# Patient Record
Sex: Female | Born: 1945 | Race: White | Hispanic: No | Smoking: Former smoker
Health system: Southern US, Community
[De-identification: ages and names within clinical notes are randomized; demographics above are authoritative.]

## PROBLEM LIST (undated history)

## (undated) DIAGNOSIS — E559 Vitamin D deficiency, unspecified: Secondary | ICD-10-CM

## (undated) DIAGNOSIS — I5042 Chronic combined systolic (congestive) and diastolic (congestive) heart failure: Secondary | ICD-10-CM

## (undated) DIAGNOSIS — E785 Hyperlipidemia, unspecified: Secondary | ICD-10-CM

## (undated) DIAGNOSIS — E538 Deficiency of other specified B group vitamins: Secondary | ICD-10-CM

## (undated) DIAGNOSIS — R0602 Shortness of breath: Secondary | ICD-10-CM

## (undated) DIAGNOSIS — I25119 Atherosclerotic heart disease of native coronary artery with unspecified angina pectoris: Secondary | ICD-10-CM

## (undated) DIAGNOSIS — I213 ST elevation (STEMI) myocardial infarction of unspecified site: Secondary | ICD-10-CM

## (undated) DIAGNOSIS — Z95810 Presence of automatic (implantable) cardiac defibrillator: Secondary | ICD-10-CM

## (undated) DIAGNOSIS — I509 Heart failure, unspecified: Secondary | ICD-10-CM

## (undated) DIAGNOSIS — I214 Non-ST elevation (NSTEMI) myocardial infarction: Secondary | ICD-10-CM

## (undated) DIAGNOSIS — I10 Essential (primary) hypertension: Secondary | ICD-10-CM

## (undated) DIAGNOSIS — J189 Pneumonia, unspecified organism: Secondary | ICD-10-CM

## (undated) DIAGNOSIS — I351 Nonrheumatic aortic (valve) insufficiency: Secondary | ICD-10-CM

## (undated) DIAGNOSIS — I5022 Chronic systolic (congestive) heart failure: Secondary | ICD-10-CM

## (undated) DIAGNOSIS — I255 Ischemic cardiomyopathy: Secondary | ICD-10-CM

## (undated) DIAGNOSIS — I2510 Atherosclerotic heart disease of native coronary artery without angina pectoris: Secondary | ICD-10-CM

## (undated) DIAGNOSIS — I2111 ST elevation (STEMI) myocardial infarction involving right coronary artery: Secondary | ICD-10-CM

## (undated) DIAGNOSIS — R079 Chest pain, unspecified: Secondary | ICD-10-CM

## (undated) DIAGNOSIS — N183 Chronic kidney disease, stage 3 (moderate): Secondary | ICD-10-CM

## (undated) DIAGNOSIS — I1310 Hypertensive heart and chronic kidney disease without heart failure, with stage 1 through stage 4 chronic kidney disease, or unspecified chronic kidney disease: Secondary | ICD-10-CM

## (undated) DIAGNOSIS — M1990 Unspecified osteoarthritis, unspecified site: Secondary | ICD-10-CM

## (undated) DIAGNOSIS — E119 Type 2 diabetes mellitus without complications: Secondary | ICD-10-CM

## (undated) HISTORY — PX: CATARACT EXTRACTION, BILATERAL: SHX1313

## (undated) HISTORY — PX: CORONARY ANGIOPLASTY WITH STENT PLACEMENT: SHX49

## (undated) HISTORY — DX: Chronic kidney disease, stage 3 (moderate): N18.3

## (undated) HISTORY — DX: Nonrheumatic aortic (valve) insufficiency: I35.1

## (undated) HISTORY — DX: Vitamin D deficiency, unspecified: E55.9

## (undated) HISTORY — PX: INSERT / REPLACE / REMOVE PACEMAKER: SUR710

## (undated) HISTORY — DX: ST elevation (STEMI) myocardial infarction involving right coronary artery: I21.11

## (undated) HISTORY — DX: Atherosclerotic heart disease of native coronary artery with unspecified angina pectoris: I25.119

## (undated) HISTORY — PX: OTHER SURGICAL HISTORY: SHX169

## (undated) HISTORY — DX: Type 2 diabetes mellitus without complications: E11.9

## (undated) HISTORY — DX: Chronic combined systolic (congestive) and diastolic (congestive) heart failure: I50.42

## (undated) HISTORY — DX: Heart failure, unspecified: I50.9

## (undated) HISTORY — PX: URETEROSCOPY: SHX842

## (undated) HISTORY — DX: Deficiency of other specified B group vitamins: E53.8

## (undated) HISTORY — DX: Essential (primary) hypertension: I10

## (undated) HISTORY — PX: CHOLECYSTECTOMY: SHX55

## (undated) HISTORY — DX: Hyperlipidemia, unspecified: E78.5

## (undated) HISTORY — DX: Shortness of breath: R06.02

## (undated) HISTORY — DX: Pneumonia, unspecified organism: J18.9

## (undated) HISTORY — PX: LITHOTRIPSY: SUR834

## (undated) HISTORY — DX: Presence of automatic (implantable) cardiac defibrillator: Z95.810

## (undated) HISTORY — DX: Unspecified osteoarthritis, unspecified site: M19.90

## (undated) HISTORY — DX: Chest pain, unspecified: R07.9

## (undated) HISTORY — DX: ST elevation (STEMI) myocardial infarction of unspecified site: I21.3

## (undated) HISTORY — DX: Ischemic cardiomyopathy: I25.5

## (undated) HISTORY — DX: Non-ST elevation (NSTEMI) myocardial infarction: I21.4

## (undated) HISTORY — DX: Atherosclerotic heart disease of native coronary artery without angina pectoris: I25.10

## (undated) HISTORY — DX: Hypertensive heart and chronic kidney disease without heart failure, with stage 1 through stage 4 chronic kidney disease, or unspecified chronic kidney disease: I13.10

## (undated) HISTORY — DX: Chronic systolic (congestive) heart failure: I50.22

---

## 2007-11-01 ENCOUNTER — Encounter: Admission: RE | Admit: 2007-11-01 | Discharge: 2007-11-01 | Payer: Self-pay | Admitting: Gastroenterology

## 2007-12-22 ENCOUNTER — Inpatient Hospital Stay (HOSPITAL_COMMUNITY): Admission: RE | Admit: 2007-12-22 | Discharge: 2007-12-24 | Payer: Self-pay | Admitting: Neurosurgery

## 2010-04-23 IMAGING — CR DG CERVICAL SPINE 2 OR 3 VIEWS
1 series · 1 of 1 positions shown · non-contrast
Comparison: None.

CLINICAL DATA: Cervical HNP and radiculopathy.  C5-6 and C6-7 ACDF.

CERVICAL SPINE - 2-3 VIEW

[view not recorded]
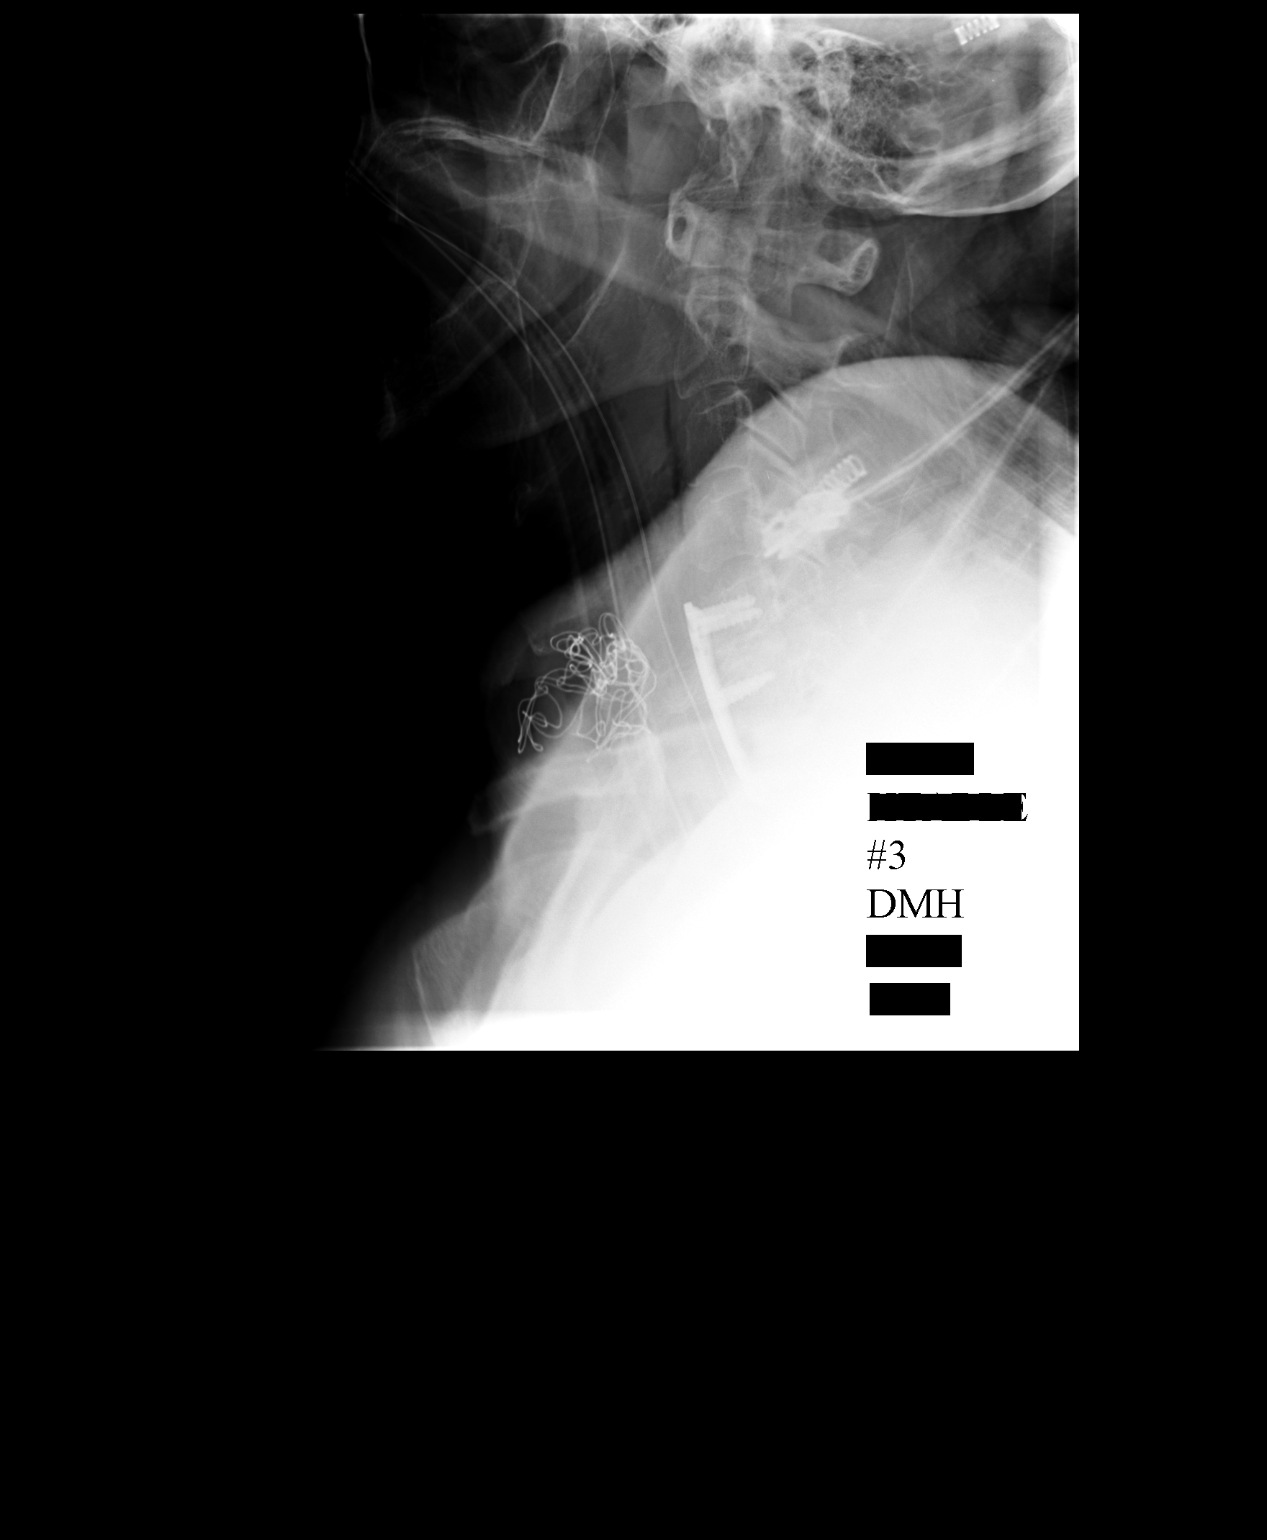

[1 of 1 positions shown; findings below may reference images not displayed]

FINDINGS: Three cross-table lateral views are received from the
operating room.  View.
1 reveals needle pointer projecting over the C4-5 disc space.
Second view was taken at 5155 hours revealing needle pointer
projecting over the C4-5 interspace and surgical instrument pointer
just anterior to the C5-6 interspace.  The third view was taken at
5027 hours revealing anterior plate and screw fixation hardware and
interbody fusion plugs from C5 to C7. C7-T1 level is obscured by
the overlying soft tissues.
IMPRESSION: Satisfactory appearance of ACDF from C5 to C7.

## 2010-12-24 NOTE — Op Note (Signed)
Lori Rivera, Lori Rivera               ACCOUNT NO.:  [id number]   MEDICAL RECORD NO.:  [id number]          PATIENT TYPE:  INP   LOCATION:  3011                         FACILITY:  [facility]   PHYSICIAN:  Leeroy Cha, M.D.   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   PROCEDURE:  1. Anterior C5-C6 and C6-C7 diskectomy.  2. Decompression of spinal cord.  3. Foraminotomy.  4. Interbody fusion with allograft and autograft plate.  5. Microscope.   SURGEON:  Leeroy Cha, MD.   ASSISTANT:  Ophelia Charter, MD.   CLINICAL HISTORY:  The patient was admitted because of neck pain  radiates to both upper extremities associated with weakness.  The pain  had been going on for several weeks, worse in the left upper extremity.  This problem, although, it got worse several weeks ago and started back  in the winter.  The x-rays showed that she has a degenerative disk  disease with spondylosis at the level of C5-C6 and C6-C7, and surgery  was advised.   PROCEDURE:  The patient was taken to the OR and after intubation, the  left side of the neck was cleaned with DuraPrep.  Transverse incision  was made through the skin subcutaneous tissue down to the cervical  spine.  X-ray twice showed that the needle was at the level of C4-C5 and  the probe of the level of C5-C6.  From then on, we removed the large  anterior osteophyte at the level of C5-C6 and C6-C7.  The patient had  __________  space that we brought immediately the microscope.  With the  microcurette as well as the drill, we did the diskectomy of C5-C6 and C6-  C7.  The patient had quite a bit of narrowing with thinning of the  posterior ligament.  Decompression was achieved, and we were  able to  decompress both C6 nerve roots bilaterally.  Then our attention was  brought to the level of C6-C7 with the same finding.  Then the endplate  was drilled.  Two piece of allograft of 7 mm with autograft in the  middle were used for fusion followed by plate using 5 screws.  Lateral  cervical spine showed good position of the graft and the plate.  From  then on, the area was irrigated.  We achieved good hemostasis, and the  wound was closed with Vicryl and Steri-Strips.           ______________________________  Leeroy Cha, M.D.     EB/MEDQ  D:  12/22/2007  T:  12/23/2007  Job:  [id number]

## 2011-05-06 ENCOUNTER — Ambulatory Visit (INDEPENDENT_AMBULATORY_CARE_PROVIDER_SITE_OTHER): Payer: Self-pay | Admitting: Ophthalmology

## 2011-06-03 ENCOUNTER — Ambulatory Visit (INDEPENDENT_AMBULATORY_CARE_PROVIDER_SITE_OTHER): Payer: Medicare Other | Admitting: Ophthalmology

## 2011-06-03 DIAGNOSIS — D3130 Benign neoplasm of unspecified choroid: Secondary | ICD-10-CM

## 2011-06-03 DIAGNOSIS — E11319 Type 2 diabetes mellitus with unspecified diabetic retinopathy without macular edema: Secondary | ICD-10-CM

## 2011-06-03 DIAGNOSIS — H43819 Vitreous degeneration, unspecified eye: Secondary | ICD-10-CM

## 2011-06-17 ENCOUNTER — Encounter (INDEPENDENT_AMBULATORY_CARE_PROVIDER_SITE_OTHER): Payer: Medicare Other | Admitting: Ophthalmology

## 2011-06-17 DIAGNOSIS — E11359 Type 2 diabetes mellitus with proliferative diabetic retinopathy without macular edema: Secondary | ICD-10-CM

## 2011-06-17 DIAGNOSIS — E1139 Type 2 diabetes mellitus with other diabetic ophthalmic complication: Secondary | ICD-10-CM

## 2011-10-15 ENCOUNTER — Ambulatory Visit (INDEPENDENT_AMBULATORY_CARE_PROVIDER_SITE_OTHER): Payer: Medicare Other | Admitting: Ophthalmology

## 2011-10-15 DIAGNOSIS — E11319 Type 2 diabetes mellitus with unspecified diabetic retinopathy without macular edema: Secondary | ICD-10-CM

## 2011-10-15 DIAGNOSIS — H43819 Vitreous degeneration, unspecified eye: Secondary | ICD-10-CM

## 2011-10-15 DIAGNOSIS — E1139 Type 2 diabetes mellitus with other diabetic ophthalmic complication: Secondary | ICD-10-CM

## 2011-10-15 DIAGNOSIS — D3130 Benign neoplasm of unspecified choroid: Secondary | ICD-10-CM

## 2011-10-15 DIAGNOSIS — E11359 Type 2 diabetes mellitus with proliferative diabetic retinopathy without macular edema: Secondary | ICD-10-CM

## 2012-04-16 ENCOUNTER — Ambulatory Visit (INDEPENDENT_AMBULATORY_CARE_PROVIDER_SITE_OTHER): Payer: Medicare Other | Admitting: Ophthalmology

## 2012-05-12 ENCOUNTER — Ambulatory Visit (INDEPENDENT_AMBULATORY_CARE_PROVIDER_SITE_OTHER): Payer: Medicare Other | Admitting: Ophthalmology

## 2012-05-12 DIAGNOSIS — H35039 Hypertensive retinopathy, unspecified eye: Secondary | ICD-10-CM

## 2012-05-12 DIAGNOSIS — D3130 Benign neoplasm of unspecified choroid: Secondary | ICD-10-CM

## 2012-05-12 DIAGNOSIS — H43819 Vitreous degeneration, unspecified eye: Secondary | ICD-10-CM

## 2012-05-12 DIAGNOSIS — E11359 Type 2 diabetes mellitus with proliferative diabetic retinopathy without macular edema: Secondary | ICD-10-CM

## 2012-05-12 DIAGNOSIS — E11319 Type 2 diabetes mellitus with unspecified diabetic retinopathy without macular edema: Secondary | ICD-10-CM

## 2012-05-12 DIAGNOSIS — I10 Essential (primary) hypertension: Secondary | ICD-10-CM

## 2012-05-12 DIAGNOSIS — E1139 Type 2 diabetes mellitus with other diabetic ophthalmic complication: Secondary | ICD-10-CM

## 2012-11-12 ENCOUNTER — Encounter (INDEPENDENT_AMBULATORY_CARE_PROVIDER_SITE_OTHER): Payer: Medicare Other | Admitting: Ophthalmology

## 2012-12-14 ENCOUNTER — Encounter (INDEPENDENT_AMBULATORY_CARE_PROVIDER_SITE_OTHER): Payer: Self-pay | Admitting: Ophthalmology

## 2014-01-18 ENCOUNTER — Encounter (INDEPENDENT_AMBULATORY_CARE_PROVIDER_SITE_OTHER): Payer: Medicare Other | Admitting: Ophthalmology

## 2014-01-18 DIAGNOSIS — I10 Essential (primary) hypertension: Secondary | ICD-10-CM

## 2014-01-18 DIAGNOSIS — H43819 Vitreous degeneration, unspecified eye: Secondary | ICD-10-CM

## 2014-01-18 DIAGNOSIS — D3130 Benign neoplasm of unspecified choroid: Secondary | ICD-10-CM

## 2014-01-18 DIAGNOSIS — H35039 Hypertensive retinopathy, unspecified eye: Secondary | ICD-10-CM

## 2014-01-18 DIAGNOSIS — E1165 Type 2 diabetes mellitus with hyperglycemia: Secondary | ICD-10-CM

## 2014-01-18 DIAGNOSIS — E1139 Type 2 diabetes mellitus with other diabetic ophthalmic complication: Secondary | ICD-10-CM

## 2014-01-18 DIAGNOSIS — E11359 Type 2 diabetes mellitus with proliferative diabetic retinopathy without macular edema: Secondary | ICD-10-CM

## 2014-01-18 DIAGNOSIS — E11319 Type 2 diabetes mellitus with unspecified diabetic retinopathy without macular edema: Secondary | ICD-10-CM

## 2014-07-21 ENCOUNTER — Ambulatory Visit (INDEPENDENT_AMBULATORY_CARE_PROVIDER_SITE_OTHER): Payer: Medicare Other | Admitting: Ophthalmology

## 2015-03-07 DIAGNOSIS — E785 Hyperlipidemia, unspecified: Secondary | ICD-10-CM | POA: Insufficient documentation

## 2015-03-07 DIAGNOSIS — I1310 Hypertensive heart and chronic kidney disease without heart failure, with stage 1 through stage 4 chronic kidney disease, or unspecified chronic kidney disease: Secondary | ICD-10-CM | POA: Insufficient documentation

## 2015-03-07 DIAGNOSIS — E1122 Type 2 diabetes mellitus with diabetic chronic kidney disease: Secondary | ICD-10-CM | POA: Insufficient documentation

## 2015-03-07 DIAGNOSIS — N183 Chronic kidney disease, stage 3 unspecified: Secondary | ICD-10-CM

## 2015-03-07 DIAGNOSIS — E119 Type 2 diabetes mellitus without complications: Secondary | ICD-10-CM

## 2015-03-07 DIAGNOSIS — I2510 Atherosclerotic heart disease of native coronary artery without angina pectoris: Secondary | ICD-10-CM | POA: Insufficient documentation

## 2015-03-07 HISTORY — DX: Hyperlipidemia, unspecified: E78.5

## 2015-03-07 HISTORY — DX: Type 2 diabetes mellitus without complications: E11.9

## 2015-03-07 HISTORY — DX: Atherosclerotic heart disease of native coronary artery without angina pectoris: I25.10

## 2015-03-07 HISTORY — DX: Hypertensive heart and chronic kidney disease without heart failure, with stage 1 through stage 4 chronic kidney disease, or unspecified chronic kidney disease: I13.10

## 2015-03-07 HISTORY — DX: Chronic kidney disease, stage 3 unspecified: N18.30

## 2015-05-25 DIAGNOSIS — I214 Non-ST elevation (NSTEMI) myocardial infarction: Secondary | ICD-10-CM

## 2015-05-25 HISTORY — DX: Non-ST elevation (NSTEMI) myocardial infarction: I21.4

## 2015-06-07 DIAGNOSIS — I5022 Chronic systolic (congestive) heart failure: Secondary | ICD-10-CM

## 2015-06-07 DIAGNOSIS — I5023 Acute on chronic systolic (congestive) heart failure: Secondary | ICD-10-CM | POA: Insufficient documentation

## 2015-06-07 DIAGNOSIS — N183 Chronic kidney disease, stage 3 unspecified: Secondary | ICD-10-CM

## 2015-06-07 HISTORY — DX: Chronic kidney disease, stage 3 unspecified: N18.30

## 2015-06-07 HISTORY — DX: Chronic systolic (congestive) heart failure: I50.22

## 2015-08-03 DIAGNOSIS — I213 ST elevation (STEMI) myocardial infarction of unspecified site: Secondary | ICD-10-CM

## 2015-08-03 DIAGNOSIS — I2111 ST elevation (STEMI) myocardial infarction involving right coronary artery: Secondary | ICD-10-CM

## 2015-08-03 HISTORY — DX: ST elevation (STEMI) myocardial infarction of unspecified site: I21.3

## 2015-08-03 HISTORY — DX: ST elevation (STEMI) myocardial infarction involving right coronary artery: I21.11

## 2015-08-08 DIAGNOSIS — J189 Pneumonia, unspecified organism: Secondary | ICD-10-CM

## 2015-08-08 DIAGNOSIS — R079 Chest pain, unspecified: Secondary | ICD-10-CM | POA: Insufficient documentation

## 2015-08-08 HISTORY — DX: Pneumonia, unspecified organism: J18.9

## 2015-08-08 HISTORY — DX: Chest pain, unspecified: R07.9

## 2015-08-19 DIAGNOSIS — J189 Pneumonia, unspecified organism: Secondary | ICD-10-CM

## 2015-08-19 DIAGNOSIS — I5042 Chronic combined systolic (congestive) and diastolic (congestive) heart failure: Secondary | ICD-10-CM

## 2015-08-19 HISTORY — DX: Chronic combined systolic (congestive) and diastolic (congestive) heart failure: I50.42

## 2015-08-19 HISTORY — DX: Pneumonia, unspecified organism: J18.9

## 2015-11-18 DIAGNOSIS — I255 Ischemic cardiomyopathy: Secondary | ICD-10-CM

## 2015-11-18 HISTORY — DX: Ischemic cardiomyopathy: I25.5

## 2016-05-14 ENCOUNTER — Ambulatory Visit (INDEPENDENT_AMBULATORY_CARE_PROVIDER_SITE_OTHER): Payer: Medicare Other | Admitting: Sports Medicine

## 2016-05-14 ENCOUNTER — Encounter: Payer: Self-pay | Admitting: Sports Medicine

## 2016-05-14 DIAGNOSIS — B351 Tinea unguium: Secondary | ICD-10-CM

## 2016-05-14 DIAGNOSIS — M79675 Pain in left toe(s): Secondary | ICD-10-CM

## 2016-05-14 DIAGNOSIS — I739 Peripheral vascular disease, unspecified: Secondary | ICD-10-CM | POA: Diagnosis not present

## 2016-05-14 DIAGNOSIS — M79674 Pain in right toe(s): Secondary | ICD-10-CM | POA: Diagnosis not present

## 2016-05-14 DIAGNOSIS — E1142 Type 2 diabetes mellitus with diabetic polyneuropathy: Secondary | ICD-10-CM

## 2016-05-14 NOTE — Patient Instructions (Signed)
Diabetes and Foot Care Diabetes may cause you to have problems because of poor blood supply (circulation) to your feet and legs. This may cause the skin on your feet to become thinner, break easier, and heal more slowly. Your skin may become dry, and the skin may peel and crack. You may also have nerve damage in your legs and feet causing decreased feeling in them. You may not notice minor injuries to your feet that could lead to infections or more serious problems. Taking care of your feet is one of the most important things you can do for yourself.  HOME CARE INSTRUCTIONS  Wear shoes at all times, even in the house. Do not go barefoot. Bare feet are easily injured.  Check your feet daily for blisters, cuts, and redness. If you cannot see the bottom of your feet, use a mirror or ask someone for help.  Wash your feet with warm water (do not use hot water) and mild soap. Then pat your feet and the areas between your toes until they are completely dry. Do not soak your feet as this can dry your skin.  Apply a moisturizing lotion or petroleum jelly (that does not contain alcohol and is unscented) to the skin on your feet and to dry, brittle toenails. Do not apply lotion between your toes.  Trim your toenails straight across. Do not dig under them or around the cuticle. File the edges of your nails with an emery board or nail file.  Do not cut corns or calluses or try to remove them with medicine.  Wear clean socks or stockings every day. Make sure they are not too tight. Do not wear knee-high stockings since they may decrease blood flow to your legs.  Wear shoes that fit properly and have enough cushioning. To break in new shoes, wear them for just a few hours a day. This prevents you from injuring your feet. Always look in your shoes before you put them on to be sure there are no objects inside.  Do not cross your legs. This may decrease the blood flow to your feet.  If you find a minor scrape,  cut, or break in the skin on your feet, keep it and the skin around it clean and dry. These areas may be cleansed with mild soap and water. Do not cleanse the area with peroxide, alcohol, or iodine.  When you remove an adhesive bandage, be sure not to damage the skin around it.  If you have a wound, look at it several times a day to make sure it is healing.  Do not use heating pads or hot water bottles. They may burn your skin. If you have lost feeling in your feet or legs, you may not know it is happening until it is too late.  Make sure your health care provider performs a complete foot exam at least annually or more often if you have foot problems. Report any cuts, sores, or bruises to your health care provider immediately. SEEK MEDICAL CARE IF:   You have an injury that is not healing.  You have cuts or breaks in the skin.  You have an ingrown nail.  You notice redness on your legs or feet.  You feel burning or tingling in your legs or feet.  You have pain or cramps in your legs and feet.  Your legs or feet are numb.  Your feet always feel cold. SEEK IMMEDIATE MEDICAL CARE IF:   There is increasing redness,   swelling, or pain in or around a wound.  There is a red line that goes up your leg.  Pus is coming from a wound.  You develop a fever or as directed by your health care provider.  You notice a bad smell coming from an ulcer or wound.   This information is not intended to replace advice given to you by your health care provider. Make sure you discuss any questions you have with your health care provider.   Document Released: 07/25/2000 Document Revised: 03/30/2013 Document Reviewed: 01/04/2013 Elsevier Interactive Patient Education 2016 [employer] Inc[employer]  

## 2016-05-14 NOTE — Progress Notes (Signed)
Subjective: Lori Rivera is a 70 y.o. female patient with history of diabetes who presents to office today complaining of long, painful nails  while ambulating in shoes; unable to trim. Patient states that the glucose reading this morning was not recorded. Patient denies any new changes in medication or new problems. Patient denies any new cramping, numbness, burning or tingling in the legs.  Patient is assisted by daughter who reports that she was unable to trim nails.   Patient Active Problem List   Diagnosis Date Noted  . Ischemic cardiomyopathy 11/18/2015  . Chronic combined systolic and diastolic heart failure ([place]) 08/19/2015  . Pneumonia due to infectious organism 08/19/2015  . Chest pain 08/08/2015  . PNA (pneumonia) 08/08/2015  . ST elevation myocardial infarction involving right coronary artery ([place]) 08/03/2015  . STEMI (ST elevation myocardial infarction) ([place]) 08/03/2015  . Chronic systolic heart failure ([place]) 06/07/2015  . CKD (chronic kidney disease) stage 3, GFR 30-59 ml/min 06/07/2015  . NSTEMI (non-ST elevated myocardial infarction) ([place]) 05/25/2015  . CAD in native artery 03/07/2015  . Hyperlipidemia 03/07/2015  . 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 Type 2 diabetes mellitus ([place]) 03/07/2015   No current outpatient prescriptions on file prior to visit.   No current facility-administered medications on file prior to visit.    Allergies  Allergen Reactions  . Codeine Other (See Comments)    confused  . Penicillins Rash    No results found for this or any previous visit (from the past 2160 hour(s)).  Objective: General: Patient is awake, alert, and oriented x 3 and in no acute distress.  Integument: Skin is cool, dry and supple bilateral. Nails are tender, long, thickened and dystrophic with subungual debris, consistent with onychomycosis, 1-5 bilateral. No signs of infection. No open lesions or preulcerative  lesions present bilateral. Remaining integument unremarkable.  Vasculature:  Dorsalis Pedis pulse 1/4 bilateral. Posterior Tibial pulse  0/4 bilateral.  Capillary fill time <5 sec 1-5 bilateral. No ischemia. No gangrene. No hair growth to the level of the digits. Temperature gradient decreased bilateral. Mild varicosities present bilateral. 1+ pitting edema present bilateral.   Neurology: The patient has intact sensation measured with a 5.07/10g Semmes Weinstein Monofilament at all pedal sites bilateral . Vibratory sensation diminished bilateral with tuning fork. No Babinski sign present bilateral.   Musculoskeletal: No symptomatic pedal deformities noted bilateral. Muscular strength 5/5 in all lower extremity muscular groups bilateral without pain on range of motion . No tenderness with calf compression bilateral.  Assessment and Plan: Problem List Items Addressed This Visit    None    Visit Diagnoses    Dermatophytosis of nail    -  Primary   Toe pain, bilateral       Diabetic polyneuropathy associated with type 2 diabetes mellitus (HCC)       Relevant Medications   aspirin EC 81 MG tablet   atorvastatin (LIPITOR) 80 MG tablet   metFORMIN (GLUCOPHAGE) 1000 MG tablet   PVD (peripheral vascular disease) (HCC)       Relevant Medications   aspirin EC 81 MG tablet   atorvastatin (LIPITOR) 80 MG tablet   furosemide (LASIX) 40 MG tablet   nitroGLYCERIN (NITROSTAT) 0.4 MG SL tablet      -Examined patient. -Discussed and educated patient on diabetic foot care, especially with  regards to the vascular, neurological and musculoskeletal systems.  -Stressed the importance of good glycemic control and the detriment of not  controlling glucose  levels in relation to the foot. -Mechanically debrided all nails 1-5 bilateral using sterile nail nipper and filed with dremel without incident  -Encouraged elevation when sitting to assist with edema control  -Answered all patient questions -Patient  to return  in 2.5 months for at risk foot care -Patient advised to call the office if any problems or questions arise in the meantime.  Landis Martins, DPM

## 2016-07-16 ENCOUNTER — Ambulatory Visit (INDEPENDENT_AMBULATORY_CARE_PROVIDER_SITE_OTHER): Payer: Medicare Other | Admitting: Sports Medicine

## 2016-07-16 ENCOUNTER — Encounter: Payer: Self-pay | Admitting: Sports Medicine

## 2016-07-16 DIAGNOSIS — M79674 Pain in right toe(s): Secondary | ICD-10-CM | POA: Diagnosis not present

## 2016-07-16 DIAGNOSIS — I739 Peripheral vascular disease, unspecified: Secondary | ICD-10-CM

## 2016-07-16 DIAGNOSIS — M79675 Pain in left toe(s): Secondary | ICD-10-CM

## 2016-07-16 DIAGNOSIS — B351 Tinea unguium: Secondary | ICD-10-CM | POA: Diagnosis not present

## 2016-07-16 DIAGNOSIS — E1142 Type 2 diabetes mellitus with diabetic polyneuropathy: Secondary | ICD-10-CM | POA: Diagnosis not present

## 2016-07-16 NOTE — Progress Notes (Signed)
Subjective: Lori Rivera is a 70 y.o. female patient with history of diabetes who presents to office today complaining of long, painful nails  while ambulating in shoes; unable to trim. Patient states that the glucose reading this morning was not recorded. Patient denies any new changes in medication or new problems. Patient denies any new cramping, numbness, burning or tingling in the legs.  Patient is assisted by son who is in the waiting room for patient.  Patient Active Problem List   Diagnosis Date Noted  . Ischemic cardiomyopathy 11/18/2015  . Chronic combined systolic and diastolic heart failure ([place]) 08/19/2015  . Pneumonia due to infectious organism 08/19/2015  . Chest pain 08/08/2015  . PNA (pneumonia) 08/08/2015  . ST elevation myocardial infarction involving right coronary artery ([place]) 08/03/2015  . STEMI (ST elevation myocardial infarction) ([place]) 08/03/2015  . Chronic systolic heart failure ([place]) 06/07/2015  . CKD (chronic kidney disease) stage 3, GFR 30-59 ml/min 06/07/2015  . NSTEMI (non-ST elevated myocardial infarction) ([place]) 05/25/2015  . CAD in native artery 03/07/2015  . Hyperlipidemia 03/07/2015  . 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 Type 2 diabetes mellitus ([place]) 03/07/2015   Current Outpatient Prescriptions on File Prior to Visit  Medication Sig Dispense Refill  . aspirin EC 81 MG tablet Take 81 mg by mouth.    Marland Kitchen atorvastatin (LIPITOR) 80 MG tablet Take 80 mg by mouth.    . furosemide (LASIX) 40 MG tablet Take 40 mg by mouth.    . metFORMIN (GLUCOPHAGE) 1000 MG tablet Take 1,000 mg by mouth.    . nitroGLYCERIN (NITROSTAT) 0.4 MG SL tablet Place 0.4 mg under the tongue.     No current facility-administered medications on file prior to visit.    Allergies  Allergen Reactions  . Codeine Other (See Comments)    confused  . Penicillins Rash    No results found for this or any previous visit (from the past 2160  hour(s)).  Objective: General: Patient is awake, alert, and oriented x 3 and in no acute distress.  Integument: Skin is cool, dry and supple bilateral. Nails are tender, long, thickened and dystrophic with subungual debris, consistent with onychomycosis, 1-5 bilateral. No signs of infection. No open lesions or preulcerative lesions present bilateral. Remaining integument unremarkable.  Vasculature:  Dorsalis Pedis pulse 1/4 bilateral. Posterior Tibial pulse  0/4 bilateral.  Capillary fill time <5 sec 1-5 bilateral. No ischemia. No gangrene. No hair growth to the level of the digits. Temperature gradient decreased bilateral. Mild varicosities present bilateral. 1+ pitting edema present bilateral.   Neurology: The patient has intact sensation measured with a 5.07/10g Semmes Weinstein Monofilament at all pedal sites bilateral . Vibratory sensation diminished bilateral with tuning fork. No Babinski sign present bilateral.   Musculoskeletal: No symptomatic pedal deformities noted bilateral. Muscular strength 5/5 in all lower extremity muscular groups bilateral without pain on range of motion . No tenderness with calf compression bilateral.  Assessment and Plan: Problem List Items Addressed This Visit    None    Visit Diagnoses    Dermatophytosis of nail    -  Primary   Toe pain, bilateral       Diabetic polyneuropathy associated with type 2 diabetes mellitus ([place])       PVD (peripheral vascular disease) ([place])          -Examined patient. -Discussed and educated patient on diabetic foot care, especially with regards to the vascular, neurological and musculoskeletal systems.  -  Stressed the importance of good glycemic control and the detriment of not controlling glucose levels in relation to the foot. -Mechanically debrided all nails 1-5 bilateral using sterile nail nipper and filed with dremel without incident  -Encouraged elevation when sitting to assist with edema control  -Answered all  patient questions -Patient to return  in 2.5 months for at risk foot care -Patient advised to call the office if any problems or questions arise in the meantime.  Landis Martins, DPM

## 2016-09-24 ENCOUNTER — Ambulatory Visit: Payer: Medicare Other | Admitting: Sports Medicine

## 2016-12-26 ENCOUNTER — Ambulatory Visit (INDEPENDENT_AMBULATORY_CARE_PROVIDER_SITE_OTHER): Payer: Medicare Other | Admitting: Sports Medicine

## 2016-12-26 ENCOUNTER — Encounter: Payer: Self-pay | Admitting: Sports Medicine

## 2016-12-26 DIAGNOSIS — I739 Peripheral vascular disease, unspecified: Secondary | ICD-10-CM

## 2016-12-26 DIAGNOSIS — E1142 Type 2 diabetes mellitus with diabetic polyneuropathy: Secondary | ICD-10-CM

## 2016-12-26 DIAGNOSIS — M79675 Pain in left toe(s): Secondary | ICD-10-CM

## 2016-12-26 DIAGNOSIS — B351 Tinea unguium: Secondary | ICD-10-CM

## 2016-12-26 DIAGNOSIS — M79674 Pain in right toe(s): Secondary | ICD-10-CM

## 2016-12-27 NOTE — Progress Notes (Signed)
Subjective: Lori Rivera is a 71 y.o. female patient with history of diabetes who presents to office today complaining of long, painful nails while ambulating in shoes; unable to trim. Patient states she had her defibilator placed and now the soreness is almost gone Patient denies any new changes in medication or other new problems. Patient denies any new cramping, numbness, burning or tingling in the legs.  Patient is assisted by granddaughter who is in the waiting room for patient.  Patient Active Problem List   Diagnosis Date Noted  . Ischemic cardiomyopathy 11/18/2015  . Chronic combined systolic and diastolic heart failure ([place]) 08/19/2015  . Pneumonia due to infectious organism 08/19/2015  . Chest pain 08/08/2015  . PNA (pneumonia) 08/08/2015  . ST elevation myocardial infarction involving right coronary artery ([place]) 08/03/2015  . STEMI (ST elevation myocardial infarction) ([place]) 08/03/2015  . Chronic systolic heart failure ([place]) 06/07/2015  . CKD (chronic kidney disease) stage 3, GFR 30-59 ml/min 06/07/2015  . NSTEMI (non-ST elevated myocardial infarction) ([place]) 05/25/2015  . CAD in native artery 03/07/2015  . Hyperlipidemia 03/07/2015  . 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 Type 2 diabetes mellitus ([place]) 03/07/2015   Current Outpatient Prescriptions on File Prior to Visit  Medication Sig Dispense Refill  . aspirin EC 81 MG tablet Take 81 mg by mouth.    Marland Kitchen atorvastatin (LIPITOR) 80 MG tablet Take 80 mg by mouth.    . furosemide (LASIX) 40 MG tablet Take 40 mg by mouth.    . metFORMIN (GLUCOPHAGE) 1000 MG tablet Take 1,000 mg by mouth.    . nitroGLYCERIN (NITROSTAT) 0.4 MG SL tablet Place 0.4 mg under the tongue.     No current facility-administered medications on file prior to visit.    Allergies  Allergen Reactions  . Codeine Other (See Comments)    confused  . Penicillins Rash    No results found for this or any previous  visit (from the past 2160 hour(s)).  Objective: General: Patient is awake, alert, and oriented x 3 and in no acute distress.  Integument: Skin is cool, dry and supple bilateral. Nails are tender, long, thickened and dystrophic with subungual debris, consistent with onychomycosis, 1-5 bilateral. No signs of infection. No open lesions or preulcerative lesions present bilateral. Remaining integument unremarkable.  Vasculature:  Dorsalis Pedis pulse 1/4 bilateral. Posterior Tibial pulse  0/4 bilateral. Capillary fill time <5 sec 1-5 bilateral. No ischemia. No gangrene. No hair growth to the level of the digits. Temperature gradient decreased bilateral. Mild varicosities present bilateral. 1+ pitting edema present bilateral.   Neurology: The patient has intact sensation measured with a 5.07/10g Semmes Weinstein Monofilament at all pedal sites bilateral . Vibratory sensation diminished bilateral with tuning fork. No Babinski sign present bilateral.   Musculoskeletal: No symptomatic pedal deformities noted bilateral. Muscular strength 5/5 in all lower extremity muscular groups bilateral without pain on range of motion . No tenderness with calf compression bilateral.  Assessment and Plan: Problem List Items Addressed This Visit    None    Visit Diagnoses    Dermatophytosis of nail    -  Primary   Toe pain, bilateral       Diabetic polyneuropathy associated with type 2 diabetes mellitus ([place])       PVD (peripheral vascular disease) ([place])          -Examined patient. -Discussed and educated patient on diabetic foot care, especially with regards to the vascular, neurological and musculoskeletal  systems.  -Stressed the importance of good glycemic control and the detriment of not controlling glucose levels in relation to the foot. -Mechanically debrided all nails 1-5 bilateral using sterile nail nipper and filed with dremel without incident  -Encouraged patient to continue with elevation when sitting  to assist with edema control  -Answered all patient questions -Patient to return in 3 months for at risk foot care -Patient advised to call the office if any problems or questions arise in the meantime.  Landis Martins, DPM

## 2017-03-27 ENCOUNTER — Ambulatory Visit (INDEPENDENT_AMBULATORY_CARE_PROVIDER_SITE_OTHER): Payer: Medicare Other | Admitting: Sports Medicine

## 2017-03-27 ENCOUNTER — Encounter (INDEPENDENT_AMBULATORY_CARE_PROVIDER_SITE_OTHER): Payer: Self-pay

## 2017-03-27 ENCOUNTER — Encounter: Payer: Self-pay | Admitting: Sports Medicine

## 2017-03-27 DIAGNOSIS — M79675 Pain in left toe(s): Secondary | ICD-10-CM

## 2017-03-27 DIAGNOSIS — B351 Tinea unguium: Secondary | ICD-10-CM

## 2017-03-27 DIAGNOSIS — I739 Peripheral vascular disease, unspecified: Secondary | ICD-10-CM

## 2017-03-27 DIAGNOSIS — M79674 Pain in right toe(s): Secondary | ICD-10-CM

## 2017-03-27 DIAGNOSIS — E1142 Type 2 diabetes mellitus with diabetic polyneuropathy: Secondary | ICD-10-CM | POA: Diagnosis not present

## 2017-03-27 NOTE — Progress Notes (Signed)
Subjective: Lori Rivera is a 71 y.o. female patient with history of diabetes who returns to office today complaining of long, painful nails while ambulating in shoes; unable to trim. Patient states she is doing good with her Pacemaker. Patient denies any new changes in medication or other new problems.   Patient is assisted by granddaughter this visit.  Patient Active Problem List   Diagnosis Date Noted  . Ischemic cardiomyopathy 11/18/2015  . Chronic combined systolic and diastolic heart failure ([place]) 08/19/2015  . Pneumonia due to infectious organism 08/19/2015  . Chest pain 08/08/2015  . PNA (pneumonia) 08/08/2015  . ST elevation myocardial infarction involving right coronary artery ([place]) 08/03/2015  . STEMI (ST elevation myocardial infarction) ([place]) 08/03/2015  . Chronic systolic heart failure ([place]) 06/07/2015  . CKD (chronic kidney disease) stage 3, GFR 30-59 ml/min 06/07/2015  . NSTEMI (non-ST elevated myocardial infarction) ([place]) 05/25/2015  . CAD in native artery 03/07/2015  . Hyperlipidemia 03/07/2015  . 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 Type 2 diabetes mellitus ([place]) 03/07/2015   Current Outpatient Prescriptions on File Prior to Visit  Medication Sig Dispense Refill  . aspirin EC 81 MG tablet Take 81 mg by mouth.    Marland Kitchen atorvastatin (LIPITOR) 80 MG tablet Take 80 mg by mouth.    . furosemide (LASIX) 40 MG tablet Take 40 mg by mouth.    . metFORMIN (GLUCOPHAGE) 1000 MG tablet Take 1,000 mg by mouth.    . nitroGLYCERIN (NITROSTAT) 0.4 MG SL tablet Place 0.4 mg under the tongue.     No current facility-administered medications on file prior to visit.    Allergies  Allergen Reactions  . Codeine Other (See Comments)    confused  . Penicillins Rash    No results found for this or any previous visit (from the past 2160 hour(s)).  Objective: General: Patient is awake, alert, and oriented x 3 and in no acute  distress.  Integument: Skin is cool, dry and supple bilateral. Nails are tender, long, thickened and dystrophic with subungual debris, consistent with onychomycosis, 1-5 bilateral. No signs of infection. No open lesions or preulcerative lesions present bilateral. Remaining integument unremarkable.  Vasculature:  Dorsalis Pedis pulse 1/4 bilateral. Posterior Tibial pulse  0/4 bilateral. Capillary fill time <5 sec 1-5 bilateral. No ischemia. No gangrene. No hair growth to the level of the digits. Temperature gradient decreased bilateral. Mild varicosities present bilateral. 1+ pitting edema present bilateral.   Neurology: The patient has intact sensation measured with a 5.07/10g Semmes Weinstein Monofilament at all pedal sites bilateral. Vibratory sensation diminished bilateral with tuning fork. No Babinski sign present bilateral.   Musculoskeletal: Asymptomatic pes planus pedal deformities noted bilateral. Muscular strength 5/5 in all lower extremity muscular groups bilateral without pain on range of motion. No tenderness with calf compression bilateral.  Assessment and Plan: Problem List Items Addressed This Visit    None    Visit Diagnoses    Dermatophytosis of nail    -  Primary   Toe pain, bilateral       Diabetic polyneuropathy associated with type 2 diabetes mellitus ([place])       PVD (peripheral vascular disease) ([place])         -Examined patient. -Discussed and educated patient on diabetic foot care, especially with regards to the vascular, neurological and musculoskeletal systems.  -Stressed the importance of good glycemic control and the detriment of not controlling glucose levels in relation to the foot. -Mechanically debrided  all nails 1-5 bilateral using sterile nail nipper and filed with dremel without incident  -Encouraged patient to continue with elevation when sitting to assist with edema control  -Continue with good supportive shoes  -Answered all patient questions -Patient  to return in 3 months for at risk foot care -Patient advised to call the office if any problems or questions arise in the meantime.  Landis Martins, DPM

## 2017-04-15 DIAGNOSIS — Z95810 Presence of automatic (implantable) cardiac defibrillator: Secondary | ICD-10-CM

## 2017-04-15 HISTORY — DX: Presence of automatic (implantable) cardiac defibrillator: Z95.810

## 2017-06-25 ENCOUNTER — Ambulatory Visit: Payer: Medicare Other | Admitting: Sports Medicine

## 2017-06-29 DIAGNOSIS — I10 Essential (primary) hypertension: Secondary | ICD-10-CM

## 2017-06-29 DIAGNOSIS — I25119 Atherosclerotic heart disease of native coronary artery with unspecified angina pectoris: Secondary | ICD-10-CM

## 2017-06-29 HISTORY — DX: Atherosclerotic heart disease of native coronary artery with unspecified angina pectoris: I25.119

## 2017-06-29 HISTORY — DX: Essential (primary) hypertension: I10

## 2017-08-27 ENCOUNTER — Ambulatory Visit (INDEPENDENT_AMBULATORY_CARE_PROVIDER_SITE_OTHER): Payer: Medicare Other | Admitting: Sports Medicine

## 2017-08-27 ENCOUNTER — Encounter: Payer: Self-pay | Admitting: Sports Medicine

## 2017-08-27 DIAGNOSIS — M79674 Pain in right toe(s): Secondary | ICD-10-CM

## 2017-08-27 DIAGNOSIS — M79675 Pain in left toe(s): Secondary | ICD-10-CM

## 2017-08-27 DIAGNOSIS — E1142 Type 2 diabetes mellitus with diabetic polyneuropathy: Secondary | ICD-10-CM | POA: Diagnosis not present

## 2017-08-27 DIAGNOSIS — I739 Peripheral vascular disease, unspecified: Secondary | ICD-10-CM

## 2017-08-27 DIAGNOSIS — B351 Tinea unguium: Secondary | ICD-10-CM | POA: Diagnosis not present

## 2017-08-27 NOTE — Progress Notes (Signed)
Subjective: Lori Rivera is a 72 y.o. female patient with history of diabetes who returns to office today complaining of long, painful nails while ambulating in shoes; unable to trim. Patient states her toes are getting a little sore especially of the big toe since her nails have grown out long.  Patient denies any new changes in medication or other new problems.  Saw PCP yesterday and got a good report.  Blood sugars not recorded.  Patient is assisted by granddaughter this visit.  Patient Active Problem List   Diagnosis Date Noted  . Ischemic cardiomyopathy 11/18/2015  . Chronic combined systolic and diastolic heart failure ([place]) 08/19/2015  . Pneumonia due to infectious organism 08/19/2015  . Chest pain 08/08/2015  . PNA (pneumonia) 08/08/2015  . ST elevation myocardial infarction involving right coronary artery ([place]) 08/03/2015  . STEMI (ST elevation myocardial infarction) ([place]) 08/03/2015  . Chronic systolic heart failure ([place]) 06/07/2015  . CKD (chronic kidney disease) stage 3, GFR 30-59 ml/min (HCC) 06/07/2015  . NSTEMI (non-ST elevated myocardial infarction) ([place]) 05/25/2015  . CAD in native artery 03/07/2015  . Hyperlipidemia 03/07/2015  . 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 Type 2 diabetes mellitus ([place]) 03/07/2015   Current Outpatient Medications on File Prior to Visit  Medication Sig Dispense Refill  . aspirin EC 81 MG tablet Take 81 mg by mouth.    Marland Kitchen atorvastatin (LIPITOR) 80 MG tablet Take 80 mg by mouth.    . furosemide (LASIX) 40 MG tablet Take 40 mg by mouth.    . metFORMIN (GLUCOPHAGE) 1000 MG tablet Take 1,000 mg by mouth.    . nitroGLYCERIN (NITROSTAT) 0.4 MG SL tablet Place 0.4 mg under the tongue.     No current facility-administered medications on file prior to visit.    Allergies  Allergen Reactions  . Codeine Other (See Comments)    confused  . Penicillins Rash    No results found for this or any  previous visit (from the past 2160 hour(s)).  Objective: General: Patient is awake, alert, and oriented x 3 and in no acute distress.  Integument: Skin is cool, dry and supple bilateral. Nails are tender, long, thickened and dystrophic with subungual debris, consistent with onychomycosis, 1-5 bilateral. No signs of infection. No open lesions or preulcerative lesions present bilateral. Remaining integument unremarkable.  Vasculature:  Dorsalis Pedis pulse 1/4 bilateral. Posterior Tibial pulse  0/4 bilateral. Capillary fill time <5 sec 1-5 bilateral. No ischemia. No gangrene. No hair growth to the level of the digits. Temperature gradient decreased bilateral. Mild varicosities present bilateral. 1+ pitting edema present bilateral.   Neurology: The patient has intact sensation measured with a 5.07/10g Semmes Weinstein Monofilament at all pedal sites bilateral. Vibratory sensation diminished bilateral with tuning fork. No Babinski sign present bilateral.   Musculoskeletal: Asymptomatic pes planus pedal deformities noted bilateral. Muscular strength 5/5 in all lower extremity muscular groups bilateral without pain on range of motion. No tenderness with calf compression bilateral.  Assessment and Plan: Problem List Items Addressed This Visit    None    Visit Diagnoses    Dermatophytosis of nail    -  Primary   Toe pain, bilateral       Diabetic polyneuropathy associated with type 2 diabetes mellitus ([place])       PVD (peripheral vascular disease) ([place])         -Examined patient. -Discussed and educated patient on diabetic foot care, especially with regards to the  vascular, neurological and musculoskeletal systems.  -Stressed the importance of good glycemic control and the detriment of not controlling glucose levels in relation to the foot. -Mechanically debrided all nails 1-5 bilateral using sterile nail nipper and filed with dremel without incident  -Encouraged patient to continue with elevation  when sitting to assist with edema control  -Continue with good supportive shoes  -Answered all patient questions -Patient to return in 3 months for at risk foot care -Patient advised to call the office if any problems or questions arise in the meantime.  Landis Martins, DPM

## 2017-09-14 DIAGNOSIS — E875 Hyperkalemia: Secondary | ICD-10-CM | POA: Diagnosis not present

## 2017-09-14 DIAGNOSIS — Z950 Presence of cardiac pacemaker: Secondary | ICD-10-CM

## 2017-09-14 DIAGNOSIS — I2510 Atherosclerotic heart disease of native coronary artery without angina pectoris: Secondary | ICD-10-CM

## 2017-09-14 DIAGNOSIS — R079 Chest pain, unspecified: Secondary | ICD-10-CM

## 2017-09-14 DIAGNOSIS — I509 Heart failure, unspecified: Secondary | ICD-10-CM | POA: Diagnosis not present

## 2017-09-14 DIAGNOSIS — N179 Acute kidney failure, unspecified: Secondary | ICD-10-CM | POA: Diagnosis not present

## 2017-09-14 DIAGNOSIS — I10 Essential (primary) hypertension: Secondary | ICD-10-CM | POA: Diagnosis not present

## 2017-09-14 DIAGNOSIS — E119 Type 2 diabetes mellitus without complications: Secondary | ICD-10-CM | POA: Diagnosis not present

## 2017-09-14 DIAGNOSIS — Z955 Presence of coronary angioplasty implant and graft: Secondary | ICD-10-CM

## 2017-09-14 DIAGNOSIS — I5040 Unspecified combined systolic (congestive) and diastolic (congestive) heart failure: Secondary | ICD-10-CM | POA: Diagnosis not present

## 2017-09-15 DIAGNOSIS — I509 Heart failure, unspecified: Secondary | ICD-10-CM | POA: Diagnosis not present

## 2017-09-15 DIAGNOSIS — Z950 Presence of cardiac pacemaker: Secondary | ICD-10-CM | POA: Diagnosis not present

## 2017-09-15 DIAGNOSIS — E119 Type 2 diabetes mellitus without complications: Secondary | ICD-10-CM | POA: Diagnosis not present

## 2017-09-15 DIAGNOSIS — R079 Chest pain, unspecified: Secondary | ICD-10-CM | POA: Diagnosis not present

## 2017-09-15 DIAGNOSIS — I10 Essential (primary) hypertension: Secondary | ICD-10-CM | POA: Diagnosis not present

## 2017-09-15 DIAGNOSIS — I5040 Unspecified combined systolic (congestive) and diastolic (congestive) heart failure: Secondary | ICD-10-CM | POA: Diagnosis not present

## 2017-09-15 DIAGNOSIS — E875 Hyperkalemia: Secondary | ICD-10-CM | POA: Diagnosis not present

## 2017-09-15 DIAGNOSIS — I2510 Atherosclerotic heart disease of native coronary artery without angina pectoris: Secondary | ICD-10-CM | POA: Diagnosis not present

## 2017-09-15 DIAGNOSIS — N179 Acute kidney failure, unspecified: Secondary | ICD-10-CM | POA: Diagnosis not present

## 2017-09-15 DIAGNOSIS — Z955 Presence of coronary angioplasty implant and graft: Secondary | ICD-10-CM | POA: Diagnosis not present

## 2017-09-24 DIAGNOSIS — E538 Deficiency of other specified B group vitamins: Secondary | ICD-10-CM | POA: Insufficient documentation

## 2017-09-24 DIAGNOSIS — M1990 Unspecified osteoarthritis, unspecified site: Secondary | ICD-10-CM

## 2017-09-24 DIAGNOSIS — I351 Nonrheumatic aortic (valve) insufficiency: Secondary | ICD-10-CM

## 2017-09-24 DIAGNOSIS — I509 Heart failure, unspecified: Secondary | ICD-10-CM

## 2017-09-24 DIAGNOSIS — E559 Vitamin D deficiency, unspecified: Secondary | ICD-10-CM

## 2017-09-24 DIAGNOSIS — R0602 Shortness of breath: Secondary | ICD-10-CM

## 2017-09-24 HISTORY — DX: Vitamin D deficiency, unspecified: E55.9

## 2017-09-24 HISTORY — DX: Nonrheumatic aortic (valve) insufficiency: I35.1

## 2017-09-24 HISTORY — DX: Shortness of breath: R06.02

## 2017-09-24 HISTORY — DX: Heart failure, unspecified: I50.9

## 2017-09-24 HISTORY — DX: Unspecified osteoarthritis, unspecified site: M19.90

## 2017-09-24 HISTORY — DX: Deficiency of other specified B group vitamins: E53.8

## 2017-10-12 NOTE — Progress Notes (Deleted)
Cardiology Office Note:    Date:  10/12/2017   ID:  Lori Rivera, DOB May 05, 1946, MRN [id number]  PCP:  Garwin Brothers, MD  Cardiologist:  Shirlee More, MD    Referring MD: Garwin Brothers, MD    ASSESSMENT:    No diagnosis found. PLAN:    In order of problems listed above:  1. ***   Next appointment: ***   Medication Adjustments/Labs and Tests Ordered: Current medicines are reviewed at length with the patient today.  Concerns regarding medicines are outlined above.  No orders of the defined types were placed in this encounter.  No orders of the defined types were placed in this encounter.   No chief complaint on file.   History of Present Illness:    Lori Rivera is a 72 y.o. female with a hx of CAD, CHF, Dyslipidemia, HTN, S/P multiple PCI and multiple time intervals and an ICD placed in April 2018 at [facility] last seen 09/19/16. Left heart cath 07/01/17: Conclusions Diagnostic Procedure Summary Chronic Total Occlusion of the RCA with collateral filling. Otherwise Diffuse non-obstructive coronary artery disease; No change from prior study. Moderate inferior segmental LV systolic dysfunction. LV ejection fraction is 45 % Diagnostic Procedure Recommendations Medical therapy for CAD Risk factor reduction, Angina Consider PCI of RCA If fails medical therapy  MPI 06/30/17: IMPRESSION: 1. Suspected moderate sized based infarct involving the apical aspect of the inferolateral wall of the left ventricle. 2. No scintigraphic evidence of prior infarction. 3. Mild ventriculomegaly. 4. Dyskinesia involving the mid aspect of the inferolateral wall of the left ventricle with relative akinesia of the septum. 5. Ejection fraction - 34%.  Compliance with diet, lifestyle and medications: *** Past Medical History:  Diagnosis Date  . Aortic regurgitation 09/24/2017  . Arthritis 09/24/2017  . CAD in native artery 03/07/2015   Overview:  1. Out of hospital cardiac arrest Aug 2016  with MI, shock, PCI and 2 Xience DES to mid and distal culprit RCA with diffuse LAD disease and hypothermia protocol 2. NonSTEMI,05/27/15  Conclusions: 1. Pt was turned down by CTS due to co-morbidities. Pt is for PCI to multiple lesions on LAD and RCA. Ramus is small, will treat medically. 2. Successful PCI / Xience Drug Eluting Stent of the m  . Chest pain 08/08/2015  . CHF (congestive heart failure) ([place]) 09/24/2017  . Chronic combined systolic and diastolic heart failure ([place]) 08/19/2015   Overview:  Echo 08/03/15: Left Ventricle Normal LV chamber size. Normal LV wall thickness. Mildly reduced overall LV systolic function. Estimated LVEF 45% Inferior, posterior and inferoseptal hypokinesis. Abnormal LV diastolic function, Grade 2, with echo evidence of elevated LA pressure.  Overview:  MUGA 20% 09/03/16 Overview:  Echo 08/03/15: Left Ventricle Normal LV chamber size. Normal LV wall   . Chronic systolic heart failure ([place]) 06/07/2015  . CKD (chronic kidney disease) stage 3, GFR 30-59 ml/min (HCC) 06/07/2015  . Coronary artery disease involving native heart with angina pectoris ([place]) 06/29/2017   Overview:  Added automatically from request for surgery [phone]  . 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 Essential hypertension 06/29/2017  . Hyperlipidemia 03/07/2015  . 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 Ischemic cardiomyopathy 11/18/2015   Overview:  EF 20-25%  . NSTEMI (non-ST elevated myocardial infarction) ([place]) 05/25/2015   Overview:  Cardiac cath 05/28/15 :Conclusions 1. Pt was turned down by CTS due to co-morbidities. Pt is for PCI  to multiple lesions on LAD and RCA. Ramus is small, will treat medically. 2. Successful PCI / Xience Drug Eluting Stent of the mid and distal Left Anterior Descending Coronary Artery. 3. Successful PCI / Xience Drug Eluting Stent of the ostial and mid Right  Coronary Artery. 4. POBA to Di  . PNA (pneumonia) 08/08/2015  . Pneumonia due to infectious organism 08/19/2015   Overview:  Discharge summary 08/11/15: Her chest x-ray showed airspace opacities concerning for multifocal pneumonia and she will be discharged on oral Levaquin. Follow-up chest x-ray is recommended to ensure resolution.  . Shortness of breath 09/24/2017  . ST elevation myocardial infarction involving right coronary artery ([place]) 08/03/2015   Overview:  Due to ISR and acute stent thrombosis Cath 08/03/15: Diagnostic procedure: Left Heart Cath PCI procedure: Stent DES, Thrombectomy Complications: No Complications. Conclusions Diagnostic Summary Acute occlusion of RCA ostial stent thrombosis Severe stenosis of the mid RCA, partially ISR Patent LAD stent CTO of small circ system Interventional Summary Successful Thrombectomy/PTCA of the o  . STEMI (ST elevation myocardial infarction) ([place]) 08/03/2015  . Type 2 diabetes mellitus, without long-term current use of insulin ([place]) 03/07/2015  . Vitamin B 12 deficiency 09/24/2017  . Vitamin D deficiency 09/24/2017    Past Surgical History:  Procedure Laterality Date  . CHOLECYSTECTOMY    . CORONARY ANGIOPLASTY WITH STENT PLACEMENT      Current Medications: No outpatient medications have been marked as taking for the 10/13/17 encounter (Appointment) with [name], MD.     Allergies:   Codeine and Penicillins   Social History   Socioeconomic History  . Marital status: Widowed    Spouse name: Not on file  . Number of children: Not on file  . Years of education: Not on file  . Highest education level: Not on file  Social Needs  . Financial resource strain: Not on file  . Food insecurity - worry: Not on file  . Food insecurity - inability: Not on file  . Transportation needs - medical: Not on file  . Transportation needs - non-medical: Not on file  Occupational History  . Not on file  Tobacco Use  . Smoking status: Former Smoker      Last attempt to quit: 06/07/1985    Years since quitting: 32.3  . Smokeless tobacco: Never Used  Substance and Sexual Activity  . Alcohol use: No    Frequency: Never  . Drug use: No  . Sexual activity: Not on file  Other Topics Concern  . Not on file  Social History Narrative  . Not on file     Family History: The patient's ***family history includes Cancer in her father; Diabetes in her mother; Hypertension in her mother; Stroke in her mother. ROS:   Please see the history of present illness.    All other systems reviewed and are negative.  EKGs/Labs/Other Studies Reviewed:    The following studies were reviewed today:  EKG:  EKG ordered today.  The ekg ordered today demonstrates ***  Recent Labs: No results found for requested labs within last 8760 hours.  Recent Lipid Panel 09/19/16: LDL 61 No results found for: CHOL, TRIG, HDL, CHOLHDL, VLDL, LDLCALC, LDLDIRECT  Physical Exam:    VS:  There were no vitals taken for this visit.    Wt Readings from Last 3 Encounters:  No data found for Wt     GEN: *** Well nourished, well developed in no acute distress HEENT: Normal NECK: No JVD; No  carotid bruits LYMPHATICS: No lymphadenopathy CARDIAC: ***RRR, no murmurs, rubs, gallops RESPIRATORY:  Clear to auscultation without rales, wheezing or rhonchi  ABDOMEN: Soft, non-tender, non-distended MUSCULOSKELETAL:  No edema; No deformity  SKIN: Warm and dry NEUROLOGIC:  Alert and oriented x 3 PSYCHIATRIC:  Normal affect    Signed, Shirlee More, MD  10/12/2017 12:47 PM    [facility]

## 2017-10-13 ENCOUNTER — Ambulatory Visit: Payer: Medicare Other | Admitting: Cardiology

## 2017-10-13 DIAGNOSIS — R0989 Other specified symptoms and signs involving the circulatory and respiratory systems: Secondary | ICD-10-CM

## 2017-10-14 ENCOUNTER — Encounter: Payer: Self-pay | Admitting: Cardiology

## 2017-11-26 ENCOUNTER — Ambulatory Visit: Payer: Medicare Other | Admitting: Sports Medicine

## 2017-11-27 DIAGNOSIS — N183 Chronic kidney disease, stage 3 unspecified: Secondary | ICD-10-CM | POA: Insufficient documentation

## 2017-11-27 DIAGNOSIS — N179 Acute kidney failure, unspecified: Secondary | ICD-10-CM | POA: Insufficient documentation

## 2018-02-27 DIAGNOSIS — R0602 Shortness of breath: Secondary | ICD-10-CM | POA: Diagnosis not present

## 2018-02-27 DIAGNOSIS — I214 Non-ST elevation (NSTEMI) myocardial infarction: Secondary | ICD-10-CM | POA: Diagnosis not present

## 2018-02-28 DIAGNOSIS — I214 Non-ST elevation (NSTEMI) myocardial infarction: Secondary | ICD-10-CM | POA: Diagnosis not present

## 2018-03-01 DIAGNOSIS — I214 Non-ST elevation (NSTEMI) myocardial infarction: Secondary | ICD-10-CM | POA: Diagnosis not present

## 2018-03-01 DIAGNOSIS — N179 Acute kidney failure, unspecified: Secondary | ICD-10-CM

## 2018-03-01 DIAGNOSIS — I509 Heart failure, unspecified: Secondary | ICD-10-CM

## 2018-03-09 NOTE — Progress Notes (Deleted)
Cardiology Office Note:    Date:  03/09/2018   ID:  Lori Rivera, DOB March 31, 1946, MRN [id number]  PCP:  Garwin Brothers, MD  Cardiologist:  Shirlee More, MD    Referring MD: Garwin Brothers, MD    ASSESSMENT:    No diagnosis found. PLAN:    In order of problems listed above:  1. ***   Next appointment: ***   Medication Adjustments/Labs and Tests Ordered: Current medicines are reviewed at length with the patient today.  Concerns regarding medicines are outlined above.  No orders of the defined types were placed in this encounter.  No orders of the defined types were placed in this encounter.   No chief complaint on file.   History of Present Illness:    Lori Rivera is a 72 y.o. female with a hx of coronary artery disease multiple PCI's and multiple siblings heart failure last EF 45% hypertension dyslipidemia and type 2 diabetes.  She also has an ICD.  Last seen by me at [facility] cardiology on  09/19/2016.  She was admitted to and underwent left heart catheterization [facility] 02/23/2018.  She had unsuccessful PCI of the proximal ramus artery due to non-dilatable lesion.  She is advised medical treatment and was not felt to be a good candidate for bypass surgery.  During that admission to the hospital she also had hemoptysis, stable heart failure and CKD.  Device telemetry reported  03/02/28 with PAF 15 minutes.  Compliance with diet, lifestyle and medications: *** Past Medical History:  Diagnosis Date  . Aortic regurgitation 09/24/2017  . Arthritis 09/24/2017  . CAD in native artery 03/07/2015   Overview:  1. Out of hospital cardiac arrest Aug 2016 with MI, shock, PCI and 2 Xience DES to mid and distal culprit RCA with diffuse LAD disease and hypothermia protocol 2. NonSTEMI,05/27/15  Conclusions: 1. Pt was turned down by CTS due to co-morbidities. Pt is for PCI to multiple lesions on LAD and RCA. Ramus is small, will treat medically. 2. Successful PCI /  Xience Drug Eluting Stent of the m  . Chest pain 08/08/2015  . CHF (congestive heart failure) ([place]) 09/24/2017  . Chronic combined systolic and diastolic heart failure ([place]) 08/19/2015   Overview:  Echo 08/03/15: Left Ventricle Normal LV chamber size. Normal LV wall thickness. Mildly reduced overall LV systolic function. Estimated LVEF 45% Inferior, posterior and inferoseptal hypokinesis. Abnormal LV diastolic function, Grade 2, with echo evidence of elevated LA pressure.  Overview:  MUGA 20% 09/03/16 Overview:  Echo 08/03/15: Left Ventricle Normal LV chamber size. Normal LV wall   . Chronic systolic heart failure ([place]) 06/07/2015  . CKD (chronic kidney disease) stage 3, GFR 30-59 ml/min (HCC) 06/07/2015  . Coronary artery disease involving native heart with angina pectoris ([place]) 06/29/2017   Overview:  Added automatically from request for surgery [id number]  . 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 Essential hypertension 06/29/2017  . Hyperlipidemia 03/07/2015  . 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 Ischemic cardiomyopathy 11/18/2015   Overview:  EF 20-25%  . NSTEMI (non-ST elevated myocardial infarction) ([place]) 05/25/2015   Overview:  Cardiac cath 05/28/15 :Conclusions 1. Pt was turned down by CTS due to co-morbidities. Pt is for PCI to multiple lesions on LAD and RCA. Ramus is small, will treat medically. 2. Successful PCI / Xience Drug Eluting Stent of the mid and distal Left Anterior Descending  Coronary Artery. 3. Successful PCI / Xience Drug Eluting Stent of the ostial and mid Right Coronary Artery. 4. POBA to Di  . PNA (pneumonia) 08/08/2015  . Pneumonia due to infectious organism 08/19/2015   Overview:  Discharge summary 08/11/15: Her chest x-ray showed airspace opacities concerning for multifocal pneumonia and she will be discharged on oral Levaquin. Follow-up chest x-ray is recommended to  ensure resolution.  . Shortness of breath 09/24/2017  . ST elevation myocardial infarction involving right coronary artery ([place]) 08/03/2015   Overview:  Due to ISR and acute stent thrombosis Cath 08/03/15: Diagnostic procedure: Left Heart Cath PCI procedure: Stent DES, Thrombectomy Complications: No Complications. Conclusions Diagnostic Summary Acute occlusion of RCA ostial stent thrombosis Severe stenosis of the mid RCA, partially ISR Patent LAD stent CTO of small circ system Interventional Summary Successful Thrombectomy/PTCA of the o  . STEMI (ST elevation myocardial infarction) ([place]) 08/03/2015  . Type 2 diabetes mellitus, without long-term current use of insulin ([place]) 03/07/2015  . Vitamin B 12 deficiency 09/24/2017  . Vitamin D deficiency 09/24/2017    Past Surgical History:  Procedure Laterality Date  . C-spine surgery    . CATARACT EXTRACTION, BILATERAL    . CHOLECYSTECTOMY    . CORONARY ANGIOPLASTY WITH STENT PLACEMENT    . INSERT / REPLACE / [place]     ICD, Medtronic  . LITHOTRIPSY    . URETEROSCOPY      Current Medications: No outpatient medications have been marked as taking for the 03/10/18 encounter (Appointment) with [name], MD.     Allergies:   Codeine and Penicillins   Social History   Socioeconomic History  . Marital status: Widowed    Spouse name: Not on file  . Number of children: Not on file  . Years of education: Not on file  . Highest education level: Not on file  Occupational History  . Not on file  Social Needs  . Financial resource strain: Not on file  . Food insecurity:    Worry: Not on file    Inability: Not on file  . Transportation needs:    Medical: Not on file    Non-medical: Not on file  Tobacco Use  . Smoking status: Former Smoker    Last attempt to quit: 06/07/1985    Years since quitting: 32.7  . Smokeless tobacco: Never Used  Substance and Sexual Activity  . Alcohol use: No    Frequency: Never  . Drug use: No  .  Sexual activity: Not on file  Lifestyle  . Physical activity:    Days per week: Not on file    Minutes per session: Not on file  . Stress: Not on file  Relationships  . Social connections:    Talks on phone: Not on file    Gets together: Not on file    Attends religious service: Not on file    Active member of club or organization: Not on file    Attends meetings of clubs or organizations: Not on file    Relationship status: Not on file  Other Topics Concern  . Not on file  Social History Narrative  . Not on file     Family History: The patient's ***family history includes CAD in her brother and brother; Cancer in her father and sister; Diabetes in her mother; Hypertension in her mother; Stroke in her mother. ROS:   Please see the history of present illness.    All other systems reviewed and are  negative.  EKGs/Labs/Other Studies Reviewed:    The following studies were reviewed today:  EKG:  EKG ordered today.  The ekg ordered today demonstrates ***  CT chest wo contrast 02/19/18 IMPRESSION: Minimal scattered basilar interstitial changes and atelectasis. Extensive atherosclerotic disease including coronary arteries. No acute intrathoracic abnormalities.  Recent Labs:  02/24/18 Cr 1,57 GFR 32 cc/min K 5.2 Kgb 10.5 Plt 189000 No results found for requested labs within last 8760 hours.  Recent Lipid Panel  Chol 134 HDL 50 no LDL reported No results found for: CHOL, TRIG, HDL, CHOLHDL, VLDL, LDLCALC, LDLDIRECT  Physical Exam:    VS:  There were no vitals taken for this visit.    Wt Readings from Last 3 Encounters:  No data found for Wt     GEN: *** Well nourished, well developed in no acute distress HEENT: Normal NECK: No JVD; No carotid bruits LYMPHATICS: No lymphadenopathy CARDIAC: ***RRR, no murmurs, rubs, gallops RESPIRATORY:  Clear to auscultation without rales, wheezing or rhonchi  ABDOMEN: Soft, non-tender, non-distended MUSCULOSKELETAL:  No edema; No  deformity  SKIN: Warm and dry NEUROLOGIC:  Alert and oriented x 3 PSYCHIATRIC:  Normal affect    Signed, Shirlee More, MD  03/09/2018 2:35 PM    [facility]

## 2018-03-10 ENCOUNTER — Ambulatory Visit: Payer: Medicare Other | Admitting: Cardiology

## 2018-03-10 ENCOUNTER — Other Ambulatory Visit: Payer: Self-pay

## 2018-03-11 ENCOUNTER — Encounter: Payer: Self-pay | Admitting: Cardiology

## 2018-04-06 DIAGNOSIS — R42 Dizziness and giddiness: Secondary | ICD-10-CM

## 2018-04-06 DIAGNOSIS — I951 Orthostatic hypotension: Secondary | ICD-10-CM

## 2018-04-07 DIAGNOSIS — R42 Dizziness and giddiness: Secondary | ICD-10-CM | POA: Diagnosis not present

## 2018-04-07 DIAGNOSIS — I951 Orthostatic hypotension: Secondary | ICD-10-CM | POA: Diagnosis not present

## 2018-04-07 DIAGNOSIS — I6789 Other cerebrovascular disease: Secondary | ICD-10-CM | POA: Diagnosis not present

## 2018-04-08 DIAGNOSIS — R42 Dizziness and giddiness: Secondary | ICD-10-CM | POA: Diagnosis not present

## 2018-04-08 DIAGNOSIS — I951 Orthostatic hypotension: Secondary | ICD-10-CM | POA: Diagnosis not present

## 2018-04-19 NOTE — Progress Notes (Deleted)
Cardiology Office Note:    Date:  04/19/2018   ID:  Lori Rivera, DOB September 07, 1945, MRN [id number]  PCP:  Garwin Brothers, MD  Cardiologist:  Shirlee More, MD    Referring MD: Garwin Brothers, MD    ASSESSMENT:    No diagnosis found. PLAN:    In order of problems listed above:  1. ***   Next appointment: ***   Medication Adjustments/Labs and Tests Ordered: Current medicines are reviewed at length with the patient today.  Concerns regarding medicines are outlined above.  No orders of the defined types were placed in this encounter.  No orders of the defined types were placed in this encounter.   No chief complaint on file.   History of Present Illness:    Lori Rivera is a 72 y.o. female with a hx of Lori Rivera is a 72 y.o. female with a hx of coronary artery disease multiple PCI's and multiple siblings heart failure last EF 45% hypertension dyslipidemia and type 2 diabetes.  She also has an ICD.  Last seen by me at [facility] cardiology on  09/19/2016.  She was admitted to and underwent left heart catheterization [facility] 02/23/2018.  She had unsuccessful PCI of the proximal ramus artery due to non-dilatable lesion.  She is advised medical treatment and was not felt to be a good candidate for bypass surgery.  During that admission to the hospital she also had hemoptysis, stable heart failure and CKD.  She was last seen 09/19/16 at [facility] cardiology.. Compliance with diet, lifestyle and medications: *** Past Medical History:  Diagnosis Date  . Aortic regurgitation 09/24/2017  . Arthritis 09/24/2017  . CAD in native artery 03/07/2015   Overview:  1. Out of hospital cardiac arrest Aug 2016 with MI, shock, PCI and 2 Xience DES to mid and distal culprit RCA with diffuse LAD disease and hypothermia protocol 2. NonSTEMI,05/27/15  Conclusions: 1. Pt was turned down by CTS due to co-morbidities. Pt is for PCI to multiple lesions on LAD and RCA. Ramus is small, will  treat medically. 2. Successful PCI / Xience Drug Eluting Stent of the m  . Chest pain 08/08/2015  . CHF (congestive heart failure) ([place]) 09/24/2017  . Chronic combined systolic and diastolic heart failure ([place]) 08/19/2015   Overview:  Echo 08/03/15: Left Ventricle Normal LV chamber size. Normal LV wall thickness. Mildly reduced overall LV systolic function. Estimated LVEF 45% Inferior, posterior and inferoseptal hypokinesis. Abnormal LV diastolic function, Grade 2, with echo evidence of elevated LA pressure.  Overview:  MUGA 20% 09/03/16 Overview:  Echo 08/03/15: Left Ventricle Normal LV chamber size. Normal LV wall   . Chronic systolic heart failure ([place]) 06/07/2015  . CKD (chronic kidney disease) stage 3, GFR 30-59 ml/min (HCC) 06/07/2015  . Coronary artery disease involving native heart with angina pectoris ([place]) 06/29/2017   Overview:  Added automatically from request for surgery [id number]  . 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 Essential hypertension 06/29/2017  . Hyperlipidemia 03/07/2015  . 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 Ischemic cardiomyopathy 11/18/2015   Overview:  EF 20-25%  . NSTEMI (non-ST elevated myocardial infarction) ([place]) 05/25/2015   Overview:  Cardiac cath 05/28/15 :Conclusions 1. Pt was turned down by CTS due to co-morbidities. Pt is for PCI to multiple lesions on LAD and RCA. Ramus is small, will treat medically. 2. Successful PCI / Xience Drug  Eluting Stent of the mid and distal Left Anterior Descending Coronary Artery. 3. Successful PCI / Xience Drug Eluting Stent of the ostial and mid Right Coronary Artery. 4. POBA to Di  . PNA (pneumonia) 08/08/2015  . Pneumonia due to infectious organism 08/19/2015   Overview:  Discharge summary 08/11/15: Her chest x-ray showed airspace opacities concerning for multifocal pneumonia and she will be discharged on oral Levaquin.  Follow-up chest x-ray is recommended to ensure resolution.  . Shortness of breath 09/24/2017  . ST elevation myocardial infarction involving right coronary artery ([place]) 08/03/2015   Overview:  Due to ISR and acute stent thrombosis Cath 08/03/15: Diagnostic procedure: Left Heart Cath PCI procedure: Stent DES, Thrombectomy Complications: No Complications. Conclusions Diagnostic Summary Acute occlusion of RCA ostial stent thrombosis Severe stenosis of the mid RCA, partially ISR Patent LAD stent CTO of small circ system Interventional Summary Successful Thrombectomy/PTCA of the o  . STEMI (ST elevation myocardial infarction) ([place]) 08/03/2015  . Type 2 diabetes mellitus, without long-term current use of insulin ([place]) 03/07/2015  . Vitamin B 12 deficiency 09/24/2017  . Vitamin D deficiency 09/24/2017    Past Surgical History:  Procedure Laterality Date  . C-spine surgery    . CATARACT EXTRACTION, BILATERAL    . CHOLECYSTECTOMY    . CORONARY ANGIOPLASTY WITH STENT PLACEMENT    . INSERT / REPLACE / [place]     ICD, Medtronic  . LITHOTRIPSY    . URETEROSCOPY      Current Medications: No outpatient medications have been marked as taking for the 04/20/18 encounter (Appointment) with [name], MD.     Allergies:   Codeine and Penicillins   Social History   Socioeconomic History  . Marital status: Widowed    Spouse name: Not on file  . Number of children: Not on file  . Years of education: Not on file  . Highest education level: Not on file  Occupational History  . Not on file  Social Needs  . Financial resource strain: Not on file  . Food insecurity:    Worry: Not on file    Inability: Not on file  . Transportation needs:    Medical: Not on file    Non-medical: Not on file  Tobacco Use  . Smoking status: Former Smoker    Last attempt to quit: 06/07/1985    Years since quitting: 32.8  . Smokeless tobacco: Never Used  Substance and Sexual Activity  . Alcohol use: No      Frequency: Never  . Drug use: No  . Sexual activity: Not on file  Lifestyle  . Physical activity:    Days per week: Not on file    Minutes per session: Not on file  . Stress: Not on file  Relationships  . Social connections:    Talks on phone: Not on file    Gets together: Not on file    Attends religious service: Not on file    Active member of club or organization: Not on file    Attends meetings of clubs or organizations: Not on file    Relationship status: Not on file  Other Topics Concern  . Not on file  Social History Narrative  . Not on file     Family History: The patient's ***family history includes CAD in her brother and brother; Cancer in her father and sister; Diabetes in her mother; Hypertension in her mother; Stroke in her mother. ROS:   Please see the history of  present illness.    All other systems reviewed and are negative.  EKGs/Labs/Other Studies Reviewed:    The following studies were reviewed today:  EKG:  EKG ordered today.  The ekg ordered today demonstrates *** Device telemetry reported  03/02/28 with PAF 15 minutes. CT chest wo contrast 02/19/18 IMPRESSION: Minimal scattered basilar interstitial changes and atelectasis. Extensive atherosclerotic disease including coronary arteries. No acute intrathoracic abnormalities. Recent Labs: No results found for requested labs within last 8760 hours.  Recent Lipid Panel No results found for: CHOL, TRIG, HDL, CHOLHDL, VLDL, LDLCALC, LDLDIRECT  Physical Exam:    VS:  There were no vitals taken for this visit.    Wt Readings from Last 3 Encounters:  No data found for Wt     GEN: *** Well nourished, well developed in no acute distress HEENT: Normal NECK: No JVD; No carotid bruits LYMPHATICS: No lymphadenopathy CARDIAC: ***RRR, no murmurs, rubs, gallops RESPIRATORY:  Clear to auscultation without rales, wheezing or rhonchi  ABDOMEN: Soft, non-tender, non-distended MUSCULOSKELETAL:  No edema; No  deformity  SKIN: Warm and dry NEUROLOGIC:  Alert and oriented x 3 PSYCHIATRIC:  Normal affect    Signed, Shirlee More, MD  04/19/2018 7:42 AM    [facility]

## 2018-04-20 ENCOUNTER — Ambulatory Visit: Payer: Medicare Other | Admitting: Cardiology

## 2018-04-30 ENCOUNTER — Ambulatory Visit (INDEPENDENT_AMBULATORY_CARE_PROVIDER_SITE_OTHER): Payer: Medicare Other | Admitting: Sports Medicine

## 2018-04-30 ENCOUNTER — Encounter: Payer: Self-pay | Admitting: Sports Medicine

## 2018-04-30 VITALS — BP 135/80 | HR 80 | Resp 15

## 2018-04-30 DIAGNOSIS — B351 Tinea unguium: Secondary | ICD-10-CM | POA: Diagnosis not present

## 2018-04-30 DIAGNOSIS — M79675 Pain in left toe(s): Secondary | ICD-10-CM

## 2018-04-30 DIAGNOSIS — I739 Peripheral vascular disease, unspecified: Secondary | ICD-10-CM | POA: Diagnosis not present

## 2018-04-30 DIAGNOSIS — M79674 Pain in right toe(s): Secondary | ICD-10-CM | POA: Diagnosis not present

## 2018-04-30 DIAGNOSIS — E1142 Type 2 diabetes mellitus with diabetic polyneuropathy: Secondary | ICD-10-CM | POA: Diagnosis not present

## 2018-04-30 NOTE — Progress Notes (Signed)
Subjective: Lori Rivera is a 72 y.o. female patient with history of diabetes who returns to office today complaining of long, painful nails while ambulating in shoes; unable to trim. Patient reports that she has been having issues with her blood pressure dropping when she has been standing and her primary doctor has been working with changing her medications to help manage this.  She reports that her last blood sugar was over 200 and she saw her primary care doctor a couple of weeks ago.  No other issues noted.  Patient is assisted by daughter this visit who is in the waiting room.  Patient Active Problem List   Diagnosis Date Noted  . Acute renal failure superimposed on stage 3 chronic kidney disease ([place]) 11/27/2017  . Arthritis 09/24/2017  . CHF (congestive heart failure) ([place]) 09/24/2017  . Shortness of breath 09/24/2017  . Aortic regurgitation 09/24/2017  . Vitamin B 12 deficiency 09/24/2017  . Vitamin D deficiency 09/24/2017  . Essential hypertension 06/29/2017  . Coronary artery disease involving native heart with angina pectoris ([place]) 06/29/2017  . 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 Ischemic cardiomyopathy 11/18/2015  . Chronic combined systolic and diastolic heart failure ([place]) 08/19/2015  . Pneumonia due to infectious organism 08/19/2015  . Chest pain 08/08/2015  . PNA (pneumonia) 08/08/2015  . ST elevation myocardial infarction involving right coronary artery ([place]) 08/03/2015  . STEMI (ST elevation myocardial infarction) ([place]) 08/03/2015  . Chronic systolic heart failure ([place]) 06/07/2015  . CKD (chronic kidney disease) stage 3, GFR 30-59 ml/min (HCC) 06/07/2015  . NSTEMI (non-ST elevated myocardial infarction) ([place]) 05/25/2015  . CAD in native artery 03/07/2015  . Hyperlipidemia 03/07/2015  . 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 Type 2 diabetes mellitus, without long-term current use  of insulin ([place]) 03/07/2015   Current Outpatient Medications on File Prior to Visit  Medication Sig Dispense Refill  . aspirin EC 81 MG tablet Take 81 mg by mouth daily.     Marland Kitchen atorvastatin (LIPITOR) 20 MG tablet Take 20 mg by mouth daily at 6 PM.     . furosemide (LASIX) 40 MG tablet Take 60 mg by mouth daily.     . isosorbide mononitrate (IMDUR) 30 MG 24 hr tablet Take 30 mg by mouth daily.    . metoprolol succinate (TOPROL-XL) 100 MG 24 hr tablet Take 100 mg by mouth daily. Take with or immediately following a meal.    . nitroGLYCERIN (NITROSTAT) 0.4 MG SL tablet Place 0.4 mg under the tongue.    . potassium chloride (MICRO-K) 10 MEQ CR capsule Take 10 mEq by mouth daily.    . ranolazine (RANEXA) 500 MG 12 hr tablet Take 500 mg by mouth 2 (two) times daily.     No current facility-administered medications on file prior to visit.    Allergies  Allergen Reactions  . Codeine Other (See Comments)    confused  . Penicillins Rash    No results found for this or any previous visit (from the past 2160 hour(s)).  Objective: General: Patient is awake, alert, and oriented x 3 and in no acute distress.  Integument: Skin is cool, dry and supple bilateral. Nails are tender, long, thickened and dystrophic with subungual debris, consistent with onychomycosis, 1-5 bilateral. No signs of infection. No open lesions or preulcerative lesions present bilateral. Remaining integument unremarkable.  Vasculature:  Dorsalis Pedis pulse 1/4 bilateral. Posterior Tibial pulse  0/4 bilateral. Capillary fill time <5  sec 1-5 bilateral. No ischemia. No gangrene. No hair growth to the level of the digits. Temperature gradient decreased bilateral. Mild varicosities present bilateral. 1+ pitting edema present bilateral.   Neurology: The patient has intact sensation measured with a 5.07/10g Semmes Weinstein Monofilament at all pedal sites bilateral. Vibratory sensation diminished bilateral with tuning fork. No Babinski  sign present bilateral.   Musculoskeletal: Asymptomatic pes planus pedal deformities noted bilateral. Muscular strength 5/5 in all lower extremity muscular groups bilateral without pain on range of motion. No tenderness with calf compression bilateral.  Assessment and Plan: Problem List Items Addressed This Visit    None    Visit Diagnoses    Dermatophytosis of nail    -  Primary   Toe pain, bilateral       Diabetic polyneuropathy associated with type 2 diabetes mellitus ([place])       PVD (peripheral vascular disease) ([place])         -Examined patient. -Discussed and educated patient on diabetic foot care, especially with regards to the vascular, neurological and musculoskeletal systems. -Mechanically debrided all nails 1-5 bilateral using sterile nail nipper and filed with dremel without incident  -Encouraged patient to continue with elevation when sitting to assist with edema control and to continue with primary care recommendations for management of blood pressure -Patient to return in 3 months for at risk foot care -Patient advised to call the office if any problems or questions arise in the meantime.  Landis Martins, DPM

## 2018-06-28 ENCOUNTER — Other Ambulatory Visit: Payer: Self-pay

## 2018-07-30 ENCOUNTER — Ambulatory Visit (INDEPENDENT_AMBULATORY_CARE_PROVIDER_SITE_OTHER): Payer: Medicare Other | Admitting: Sports Medicine

## 2018-07-30 ENCOUNTER — Encounter: Payer: Self-pay | Admitting: Sports Medicine

## 2018-07-30 VITALS — BP 164/80 | HR 108 | Resp 16

## 2018-07-30 DIAGNOSIS — I739 Peripheral vascular disease, unspecified: Secondary | ICD-10-CM

## 2018-07-30 DIAGNOSIS — E1142 Type 2 diabetes mellitus with diabetic polyneuropathy: Secondary | ICD-10-CM | POA: Diagnosis not present

## 2018-07-30 DIAGNOSIS — M79675 Pain in left toe(s): Secondary | ICD-10-CM

## 2018-07-30 DIAGNOSIS — M79674 Pain in right toe(s): Secondary | ICD-10-CM | POA: Diagnosis not present

## 2018-07-30 DIAGNOSIS — B351 Tinea unguium: Secondary | ICD-10-CM

## 2018-07-30 NOTE — Progress Notes (Signed)
Subjective: Lori Rivera is a 72 y.o. female patient with history of diabetes who returns to office today complaining of long, painful nails while ambulating in shoes; unable to trim.  Patient denies any changes with medications or health since last visit.  Blood sugar for today was 235 and saw her primary care doctor last month.  No other issues noted.  Patient is assisted by grand-daughter this visit who is in the waiting room.  Patient Active Problem List   Diagnosis Date Noted  . Acute renal failure superimposed on stage 3 chronic kidney disease ([place]) 11/27/2017  . Arthritis 09/24/2017  . CHF (congestive heart failure) ([place]) 09/24/2017  . Shortness of breath 09/24/2017  . Aortic regurgitation 09/24/2017  . Vitamin B 12 deficiency 09/24/2017  . Vitamin D deficiency 09/24/2017  . Essential hypertension 06/29/2017  . Coronary artery disease involving native heart with angina pectoris ([place]) 06/29/2017  . 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 Ischemic cardiomyopathy 11/18/2015  . Chronic combined systolic and diastolic heart failure ([place]) 08/19/2015  . Pneumonia due to infectious organism 08/19/2015  . Chest pain 08/08/2015  . PNA (pneumonia) 08/08/2015  . ST elevation myocardial infarction involving right coronary artery ([place]) 08/03/2015  . STEMI (ST elevation myocardial infarction) ([place]) 08/03/2015  . Chronic systolic heart failure ([place]) 06/07/2015  . CKD (chronic kidney disease) stage 3, GFR 30-59 ml/min (HCC) 06/07/2015  . NSTEMI (non-ST elevated myocardial infarction) ([place]) 05/25/2015  . CAD in native artery 03/07/2015  . Hyperlipidemia 03/07/2015  . 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) 03/07/2015  . Type 2 diabetes mellitus, without long-term current use of insulin ([place]) 03/07/2015   Current Outpatient Medications on File Prior to Visit  Medication Sig Dispense Refill  . aspirin EC 81 MG tablet Take 81  mg by mouth daily.     Marland Kitchen atorvastatin (LIPITOR) 20 MG tablet Take 20 mg by mouth daily at 6 PM.     . furosemide (LASIX) 40 MG tablet Take 60 mg by mouth daily.     . isosorbide mononitrate (IMDUR) 30 MG 24 hr tablet Take 30 mg by mouth daily.    . metoprolol succinate (TOPROL-XL) 100 MG 24 hr tablet Take 100 mg by mouth daily. Take with or immediately following a meal.    . potassium chloride (MICRO-K) 10 MEQ CR capsule Take 10 mEq by mouth daily.    . ranolazine (RANEXA) 500 MG 12 hr tablet Take 500 mg by mouth 2 (two) times daily.    . nitroGLYCERIN (NITROSTAT) 0.4 MG SL tablet Place 0.4 mg under the tongue.     No current facility-administered medications on file prior to visit.    Allergies  Allergen Reactions  . Codeine Other (See Comments)    confused  . Penicillins Rash    No results found for this or any previous visit (from the past 2160 hour(s)).  Objective: General: Patient is awake, alert, and oriented x 3 and in no acute distress.  Integument: Skin is cool, dry and supple bilateral. Nails are tender, long, thickened and dystrophic with subungual debris, consistent with onychomycosis, 1-5 bilateral. No signs of infection. No open lesions or preulcerative lesions present bilateral. Remaining integument unremarkable.  Vasculature:  Dorsalis Pedis pulse 1/4 bilateral. Posterior Tibial pulse  0/4 bilateral. Capillary fill time <5 sec 1-5 bilateral. No ischemia. No gangrene. No hair growth to the level of the digits. Temperature gradient decreased bilateral. Mild varicosities present bilateral. 1+ pitting edema present bilateral.  Neurology: The patient has intact sensation measured with a 5.07/10g Semmes Weinstein Monofilament at all pedal sites bilateral. Vibratory sensation diminished bilateral with tuning fork. No Babinski sign present bilateral.   Musculoskeletal: Asymptomatic pes planus pedal deformities noted bilateral. Muscular strength 5/5 in all lower extremity  muscular groups bilateral without pain on range of motion. No tenderness with calf compression bilateral.  Assessment and Plan: Problem List Items Addressed This Visit    None    Visit Diagnoses    Dermatophytosis of nail    -  Primary   Toe pain, bilateral       Diabetic polyneuropathy associated with type 2 diabetes mellitus ([place])       PVD (peripheral vascular disease) ([place])         -Examined patient. -Discussed and educated patient on diabetic foot care, especially with regards to the vascular, neurological and musculoskeletal systems. -Mechanically debrided all nails 1-5 bilateral using sterile nail nipper and filed with dremel without incident  -Patient to return in 3 months for at risk foot care -Patient advised to call the office if any problems or questions arise in the meantime.  Landis Martins, DPM

## 2018-09-14 ENCOUNTER — Telehealth: Payer: Self-pay | Admitting: Cardiology

## 2018-09-14 NOTE — Telephone Encounter (Signed)
Patients daughter would like a call regarding her mother taking Plavix.

## 2018-09-15 NOTE — Telephone Encounter (Signed)
Spoke with patient's daughter, Lori Rivera, and advised that since patient has not seen Dr. Bettina Gavia since 09/2016, we cannot advised on medications at this time. Kim verbalized understanding and was provided with the [facility] office's phone number where the patient has been followed recently to call to address medication questions.   Kim requested patient be scheduled for the next available appointment to re-establish cardiology care with Dr. Bettina Gavia. Patient will see Dr. Bettina Gavia on Wednesday, 10/13/2018 at 3:40 pm in the [place] office. Address provided. Kim verbalized understanding to arrive 20 minutes early. No further questions.

## 2018-10-12 NOTE — Progress Notes (Signed)
Cardiology Office Note:    Date:  10/13/2018   ID:  Lori Rivera, DOB [birth year], MRN [id number]  PCP:  Garwin Brothers, MD  Cardiologist:  Shirlee More, MD    Referring MD: Garwin Brothers, MD    ASSESSMENT:    1. Coronary artery disease involving native coronary artery of native heart with angina pectoris ([place])   2. Chronic combined systolic and diastolic heart failure ([place])   3. 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 Mixed hyperlipidemia   6. Type 2 diabetes mellitus without complication, without long-term current use of insulin (HCC)    PLAN:    In order of problems listed above:  1. She is improved overall is done well with medical treatment she and her family are pleased with her quality of life and at this time and continue medical therapy including aspirin statin and nitroglycerin.  If she started to have more frequent angina long-acting oral nitrates would be appropriate.  At this time I do not think she would benefit from ischemia evaluation as her last coronary angiogram showed diffuse severe diffuse CAD and no targets for revascularization 2. Stable compensated New York Heart Association class I continue her current diuretic sodium restriction and self-management by the patient and her family 3. Stable at this time not on antihypertensive agents with previous severe symptomatic hypotension influenced by her diabetes and neuropathy 4. Stable ICD she asked me to and I referred her to EP in our practice and transition to our device clinic to streamline care.  She has not had ICD therapy and if she had frequent episodes would benefit from either EP ablation or antiarrhythmic drug such as amiodarone 5. Stable continue her statin lipids are at target 6. Stable diabetes continue current treatment   Next appointment: 6 months   Medication Adjustments/Labs and Tests Ordered: Current  medicines are reviewed at length with the patient today.  Concerns regarding medicines are outlined above.  Orders Placed This Encounter  Procedures  . Ambulatory referral to Cardiac Electrophysiology  . EKG 12-Lead   No orders of the defined types were placed in this encounter.   Chief Complaint  Patient presents with  . Follow-up  . Coronary Artery Disease  . Congestive Heart Failure    History of Present Illness:    Lori Rivera is a 73 y.o. female with a hx of coronary artery disease multiple PCI's of her multiple time intervals heart failure dyslipidemia hypertension and ICD.  Last seen by me 09/19/16 at [facility] cardiology.  She has a Biotronik dual-chamber defibrillator last device check 06/18/2018 normal function no VT VF detected.  Close gaps in care reestablishing in my practice I reviewed her records at [facility] in care everywhere including her last coronary angiogram 02/22/2018 at which time she had unsuccessful attempt at PCI of the ramus branch and  diffuse CAD and just be best treated medically. Compliance with diet, lifestyle and medications: Overall she has done well is compliant with medications and self-management assisted by her family  She has rare angina with emotional upset relieved with nitroglycerin no edema orthopnea shortness of breath palpitation or syncope she has had no discharge from her ICD.  She had labs performed at her PCP office 09/07/2018 HDL 51 cholesterol 154 creatinine 1.34. Past Medical History:  Diagnosis Date  . Aortic regurgitation 09/24/2017  . Arthritis 09/24/2017  . CAD in native artery 03/07/2015  Overview:  1. Out of hospital cardiac arrest Aug 2016 with MI, shock, PCI and 2 Xience DES to mid and distal culprit RCA with diffuse LAD disease and hypothermia protocol 2. NonSTEMI,05/27/15  Conclusions: 1. Pt was turned down by CTS due to co-morbidities. Pt is for PCI to multiple lesions on LAD and RCA. Ramus is small, will treat medically.  2. Successful PCI / Xience Drug Eluting Stent of the m  . Chest pain 08/08/2015  . CHF (congestive heart failure) ([place]) 09/24/2017  . Chronic combined systolic and diastolic heart failure ([place]) 08/19/2015   Overview:  Echo 08/03/15: Left Ventricle Normal LV chamber size. Normal LV wall thickness. Mildly reduced overall LV systolic function. Estimated LVEF 45% Inferior, posterior and inferoseptal hypokinesis. Abnormal LV diastolic function, Grade 2, with echo evidence of elevated LA pressure.  Overview:  MUGA 20% 09/03/16 Overview:  Echo 08/03/15: Left Ventricle Normal LV chamber size. Normal LV wall   . Chronic systolic heart failure ([place]) 06/07/2015  . CKD (chronic kidney disease) stage 3, GFR 30-59 ml/min (HCC) 06/07/2015  . Coronary artery disease involving native heart with angina pectoris ([place]) 06/29/2017   Overview:  Added automatically from request for surgery [phone]  . 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 Essential hypertension 06/29/2017  . Hyperlipidemia 03/07/2015  . 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 Ischemic cardiomyopathy 11/18/2015   Overview:  EF 20-25%  . NSTEMI (non-ST elevated myocardial infarction) ([place]) 05/25/2015   Overview:  Cardiac cath 05/28/15 :Conclusions 1. Pt was turned down by CTS due to co-morbidities. Pt is for PCI to multiple lesions on LAD and RCA. Ramus is small, will treat medically. 2. Successful PCI / Xience Drug Eluting Stent of the mid and distal Left Anterior Descending Coronary Artery. 3. Successful PCI / Xience Drug Eluting Stent of the ostial and mid Right Coronary Artery. 4. POBA to Di  . PNA (pneumonia) 08/08/2015  . Pneumonia due to infectious organism 08/19/2015   Overview:  Discharge summary 08/11/15: Her chest x-ray showed airspace opacities concerning for multifocal pneumonia and she will be discharged on oral Levaquin. Follow-up chest  x-ray is recommended to ensure resolution.  . Shortness of breath 09/24/2017  . ST elevation myocardial infarction involving right coronary artery ([place]) 08/03/2015   Overview:  Due to ISR and acute stent thrombosis Cath 08/03/15: Diagnostic procedure: Left Heart Cath PCI procedure: Stent DES, Thrombectomy Complications: No Complications. Conclusions Diagnostic Summary Acute occlusion of RCA ostial stent thrombosis Severe stenosis of the mid RCA, partially ISR Patent LAD stent CTO of small circ system Interventional Summary Successful Thrombectomy/PTCA of the o  . STEMI (ST elevation myocardial infarction) ([place]) 08/03/2015  . Type 2 diabetes mellitus, without long-term current use of insulin ([place]) 03/07/2015  . Vitamin B 12 deficiency 09/24/2017  . Vitamin D deficiency 09/24/2017    Past Surgical History:  Procedure Laterality Date  . C-spine surgery    . CATARACT EXTRACTION, BILATERAL    . CHOLECYSTECTOMY    . CORONARY ANGIOPLASTY WITH STENT PLACEMENT    . INSERT / REPLACE / [place]     ICD, Medtronic  . LITHOTRIPSY    . URETEROSCOPY      Current Medications: Current Meds  Medication Sig  . aspirin EC 81 MG tablet Take 81 mg by mouth daily.   Marland Kitchen atorvastatin (LIPITOR) 20 MG tablet Take 20 mg by mouth daily at 6 PM.   .  Dulaglutide (TRULICITY) 4.19 FX/9.0WI SOPN Inject 1 pen into the skin daily.  . furosemide (LASIX) 40 MG tablet Take 60 mg by mouth daily.   . nitroGLYCERIN (NITROSTAT) 0.4 MG SL tablet Place 0.4 mg under the tongue every 5 (five) minutes as needed.   . potassium chloride (MICRO-K) 10 MEQ CR capsule Take 10 mEq by mouth daily.     Allergies:   Codeine and Penicillins   Social History   Socioeconomic History  . Marital status: Widowed    Spouse name: Not on file  . Number of children: Not on file  . Years of education: Not on file  . Highest education level: Not on file  Occupational History  . Not on file  Social Needs  . Financial resource strain: Not  on file  . Food insecurity:    Worry: Not on file    Inability: Not on file  . Transportation needs:    Medical: Not on file    Non-medical: Not on file  Tobacco Use  . Smoking status: Former Smoker    Last attempt to quit: 06/07/1985    Years since quitting: 33.3  . Smokeless tobacco: Never Used  Substance and Sexual Activity  . Alcohol use: No    Frequency: Never  . Drug use: No  . Sexual activity: Not on file  Lifestyle  . Physical activity:    Days per week: Not on file    Minutes per session: Not on file  . Stress: Not on file  Relationships  . Social connections:    Talks on phone: Not on file    Gets together: Not on file    Attends religious service: Not on file    Active member of club or organization: Not on file    Attends meetings of clubs or organizations: Not on file    Relationship status: Not on file  Other Topics Concern  . Not on file  Social History Narrative  . Not on file     Family History: The patient's family history includes CAD in her brother and brother; Cancer in her father and sister; Diabetes in her mother; Hypertension in her mother; Stroke in her mother. ROS:   Please see the history of present illness.    All other systems reviewed and are negative.  EKGs/Labs/Other Studies Reviewed:    The following studies were reviewed today:  EKG:  EKG ordered today and personally reviewed.  The ekg ordered today demonstrates sinus rhythm 96 bpm incomplete left bundle branch block repolarization changes  Recent Labs:   Lipid profile 09/07/2018 cholesterol 154 HDL 51 non-HDL cholesterol 103 triglycerides 118 LDL was less than 100 from equation A1c 5.4% creatinine 1.34 No results found for requested labs within last 8760 hours.  Recent Lipid Panel No results found for: CHOL, TRIG, HDL, CHOLHDL, VLDL, LDLCALC, LDLDIRECT  Physical Exam:    VS:  BP (!) 148/72 (BP Location: Right Arm, Patient Position: Sitting, Cuff Size: Normal)   Pulse 96   Ht  5\' 9"  (1.753 m)   Wt 195 lb (88.5 kg)   SpO2 97%   BMI 28.80 kg/m     Wt Readings from Last 3 Encounters:  10/13/18 195 lb (88.5 kg)     GEN: She looks better than last time I saw her but is still quite frail slow thought and speech well nourished, well developed in no acute distress HEENT: Normal NECK: No JVD; No carotid bruits LYMPHATICS: No lymphadenopathy CARDIAC: RRR, no murmurs,  rubs, gallops RESPIRATORY:  Clear to auscultation without rales, wheezing or rhonchi  ABDOMEN: Soft, non-tender, non-distended MUSCULOSKELETAL:  No edema; No deformity  SKIN: Warm and dry NEUROLOGIC:  Alert and oriented x 3 PSYCHIATRIC:  Normal affect    Signed, Shirlee More, MD  10/13/2018 5:44 PM    [facility]

## 2018-10-13 ENCOUNTER — Ambulatory Visit (INDEPENDENT_AMBULATORY_CARE_PROVIDER_SITE_OTHER): Payer: Medicare Other | Admitting: Cardiology

## 2018-10-13 ENCOUNTER — Encounter: Payer: Self-pay | Admitting: Cardiology

## 2018-10-13 VITALS — BP 148/72 | HR 96 | Ht 69.0 in | Wt 195.0 lb

## 2018-10-13 DIAGNOSIS — E119 Type 2 diabetes mellitus without complications: Secondary | ICD-10-CM

## 2018-10-13 DIAGNOSIS — I1310 Hypertensive heart and chronic kidney disease without heart failure, with stage 1 through stage 4 chronic kidney disease, or unspecified chronic kidney disease: Secondary | ICD-10-CM | POA: Diagnosis not present

## 2018-10-13 DIAGNOSIS — I5042 Chronic combined systolic (congestive) and diastolic (congestive) heart failure: Secondary | ICD-10-CM | POA: Diagnosis not present

## 2018-10-13 DIAGNOSIS — E782 Mixed hyperlipidemia: Secondary | ICD-10-CM

## 2018-10-13 DIAGNOSIS — N183 Chronic kidney disease, stage 3 (moderate): Secondary | ICD-10-CM

## 2018-10-13 DIAGNOSIS — Z95810 Presence of automatic (implantable) cardiac defibrillator: Secondary | ICD-10-CM | POA: Diagnosis not present

## 2018-10-13 DIAGNOSIS — I25119 Atherosclerotic heart disease of native coronary artery with unspecified angina pectoris: Secondary | ICD-10-CM

## 2018-10-13 NOTE — Patient Instructions (Signed)
Medication Instructions:  Your physician recommends that you continue on your current medications as directed. Please refer to the Current Medication list given to you today.  If you need a refill on your cardiac medications before your next appointment, please call your pharmacy.   Lab work: None  If you have labs (blood work) drawn today and your tests are completely normal, you will receive your results only by: Marland Kitchen MyChart Message (if you have MyChart) OR . A paper copy in the mail If you have any lab test that is abnormal or we need to change your treatment, we will call you to review the results.  Testing/Procedures: You had an EKG today.   You have been referred to see an electrophysiologist, Dr. Curt Bears. This appointment will be scheduled before you leave the office today.   Follow-Up: At [facility], you and your health needs are our priority.  As part of our continuing mission to provide you with exceptional heart care, we have created designated Provider Care Teams.  These Care Teams include your primary Cardiologist (physician) and Advanced Practice Providers (APPs -  Physician Assistants and Nurse Practitioners) who all work together to provide you with the care you need, when you need it. You will need a follow up appointment in 3 months.

## 2018-10-15 ENCOUNTER — Telehealth: Payer: Self-pay

## 2018-10-15 NOTE — Telephone Encounter (Signed)
Called to Dr Latina Craver office and spoke with [place].  [place] was made aware that when patient was in the office to see Dr Bettina Gavia she reported to using Trulicity injections daily.  Patient was advised in our office that this is a weekly injection, and the staff at Dr Latina Craver office was asked to follow up with patient regarding correct dosing of this medication.  [place] agreed to make Dr Reesa Chew aware for further instructions.

## 2018-10-20 ENCOUNTER — Telehealth: Payer: Self-pay | Admitting: Cardiology

## 2018-10-20 NOTE — Telephone Encounter (Signed)
Left message at Kim's number [phone] to return call for recommendations.

## 2018-10-20 NOTE — Telephone Encounter (Signed)
Please call regarding medication questions.

## 2018-10-20 NOTE — Telephone Encounter (Signed)
-----   Message from [name], MD sent at 10/20/2018  2:10 PM EDT ----- Start a beta blocker lopressor 25 mg twice a day

## 2018-10-20 NOTE — Telephone Encounter (Signed)
Spoke with Maudie Mercury patient's daughter regarding the cost of Ranexa.  Maudie Mercury states that patient had been taking Ranexa, but the cost had went up and she was not able to afford it.  She went to [facility] ED last night for evaluation of chest pain.   Records from [facility] were printed for Dr Bettina Gavia to review.    Maudie Mercury states that patient has tried isosorbide in the past, and was unable to tolerate this medication.

## 2018-10-21 MED ORDER — METOPROLOL TARTRATE 25 MG PO TABS: 25.0000 mg | ORAL_TABLET | Freq: Two times a day (BID) | ORAL | 1 refills | Status: DC

## 2018-10-21 NOTE — Telephone Encounter (Signed)
Patient's daughter advised to start metoprolol tartrate 25mg  one tablet twice daily.  Rx sent to pharmacy.  Kim verbalized understanding.

## 2018-10-22 NOTE — Telephone Encounter (Signed)
Spoke with Darral Dash at Dr. Latina Craver office to reconcile medication list. Informed her that patient has been switched from ranexa to metoprolol tartrate 25 mg twice daily as of yesterday. No further questions.

## 2018-10-29 ENCOUNTER — Ambulatory Visit: Payer: Medicare Other | Admitting: Sports Medicine

## 2018-12-01 ENCOUNTER — Ambulatory Visit: Payer: Medicare Other | Admitting: Sports Medicine

## 2018-12-08 ENCOUNTER — Telehealth: Payer: Self-pay | Admitting: *Deleted

## 2018-12-08 NOTE — Telephone Encounter (Signed)
Calling patient today to discuss upcoming appointment.  We are currently trying to limit exposure to the virus that causes COVID-19 by seeing patients at home rather than in the office. We would like to schedule this appointment as a Psychologist, counselling. Unable to reach patient.  No VM Phones rings

## 2018-12-09 ENCOUNTER — Encounter: Payer: Medicare Other | Admitting: *Deleted

## 2018-12-09 NOTE — Telephone Encounter (Signed)
Home Monitoring successfully transferred to our clinic. Added to remote schedule for today for processing.

## 2018-12-09 NOTE — Telephone Encounter (Signed)
Unable to reach patient multiple times. Appointment canceled due to not being able to obtain information to establish and transfer device to current clinic.  Appointment will be rescheduled at a later time.  Someone from Dr. Curt Bears team will contact the patient for further information.

## 2018-12-13 ENCOUNTER — Other Ambulatory Visit: Payer: Self-pay | Admitting: Cardiology

## 2018-12-13 ENCOUNTER — Ambulatory Visit (INDEPENDENT_AMBULATORY_CARE_PROVIDER_SITE_OTHER): Payer: Medicare Other | Admitting: *Deleted

## 2018-12-13 ENCOUNTER — Encounter: Payer: Medicare Other | Admitting: Cardiology

## 2018-12-13 DIAGNOSIS — Z95810 Presence of automatic (implantable) cardiac defibrillator: Secondary | ICD-10-CM

## 2018-12-13 NOTE — Telephone Encounter (Signed)
Spoke to pt and dtr. Informed them that I would be in touch at a later date to schedule pt once her device information is available in our system. dtr verbalized understanding.

## 2018-12-14 LAB — CUP PACEART REMOTE DEVICE CHECK
Date Time Interrogation Session: 20200505230756
Implantable Lead Implant Date: 20180412
Implantable Lead Implant Date: 20180412
Implantable Lead Location: 753859
Implantable Lead Location: 753860
Implantable Lead Model: 377
Implantable Lead Model: 402266
Implantable Lead Serial Number: 4970639
Implantable Lead Serial Number: 49730543
Implantable Pulse Generator Implant Date: 20180412
Pulse Gen Model: 404623
Pulse Gen Serial Number: 60967865

## 2018-12-16 ENCOUNTER — Ambulatory Visit: Payer: Medicare Other | Admitting: Sports Medicine

## 2018-12-20 ENCOUNTER — Other Ambulatory Visit: Payer: Self-pay

## 2018-12-20 ENCOUNTER — Encounter: Payer: Self-pay | Admitting: Cardiology

## 2018-12-20 NOTE — Progress Notes (Signed)
Remote ICD transmission.   

## 2018-12-20 NOTE — Addendum Note (Signed)
Addended by: Tiajuana Amass on: 12/20/2018 07:21 AM   Modules accepted: Level of Service

## 2018-12-29 ENCOUNTER — Telehealth: Payer: Self-pay | Admitting: Cardiology

## 2018-12-29 NOTE — Telephone Encounter (Signed)
Needs to be seen by camnitz

## 2018-12-29 NOTE — Telephone Encounter (Signed)
Pt returned your call/I tried to call you and I skyped you to let you know

## 2018-12-29 NOTE — Telephone Encounter (Signed)
Her defibulator has went off twice

## 2018-12-29 NOTE — Telephone Encounter (Signed)
Pt phoned this morning reporting defibrillator shocks last night. Forwarded info to [facility], pls see above report from interrogation.    pls advise, tx

## 2018-12-29 NOTE — Telephone Encounter (Signed)
Returned patient's call. States defibrillator went off twice last night when she was standing in front of the refrigerator. She denies chest or other pain/pressure or tightness. Denies shortness of breath, however does report ankle swelling for awhile. Denies heart feeling irregular. Medications reviewed, she is taking all as ordered. BP today 146/60, HR 81 which is usual for her.   pls advise, tx

## 2018-12-29 NOTE — Telephone Encounter (Signed)
Remote reviewed. Pt with ICD shock x2 - appears to be inappropriate 2/2 sinus tachycardia.  Her VT-1 zone is set at 150bpm with ATP x3 then HV therapy.  Will forward to Dr Curt Bears and Venida Jarvis for review. If he agrees inappropriate therapy, she will need office visit for device reprogramming. She needs a virtual office visit with Dr Curt Bears to establish and set reprogramming guidelines.      Chanetta Marshall, NP 12/29/2018 12:12 PM

## 2018-12-30 ENCOUNTER — Telehealth: Payer: Self-pay | Admitting: *Deleted

## 2018-12-30 NOTE — Telephone Encounter (Signed)
Phoned patient to ensure someone from Dr. Curt Bears' office has been in touch with her. She states that someone from their office called and they will call back this afternoon to schedule appointment.

## 2018-12-30 NOTE — Telephone Encounter (Signed)
Please see previous encounter

## 2018-12-30 NOTE — Telephone Encounter (Signed)
Pt stated that her Defibrillator went off twice yesterday, none today. Pt states feels pretty good, not sure why it went off. Please advise. Had appt to see Camnitz on 5/4 but cancelled it due to covid 19. Please advise

## 2018-12-30 NOTE — Telephone Encounter (Signed)
Melissa informed to call pt and arrange virtual visit with Camnitz next week

## 2019-01-04 ENCOUNTER — Other Ambulatory Visit: Payer: Self-pay

## 2019-01-04 ENCOUNTER — Telehealth: Payer: Self-pay

## 2019-01-04 ENCOUNTER — Telehealth (INDEPENDENT_AMBULATORY_CARE_PROVIDER_SITE_OTHER): Payer: Medicare Other | Admitting: Cardiology

## 2019-01-04 ENCOUNTER — Telehealth: Payer: Self-pay | Admitting: *Deleted

## 2019-01-04 DIAGNOSIS — Z95810 Presence of automatic (implantable) cardiac defibrillator: Secondary | ICD-10-CM

## 2019-01-04 DIAGNOSIS — I255 Ischemic cardiomyopathy: Secondary | ICD-10-CM

## 2019-01-04 DIAGNOSIS — I2510 Atherosclerotic heart disease of native coronary artery without angina pectoris: Secondary | ICD-10-CM

## 2019-01-04 DIAGNOSIS — I5022 Chronic systolic (congestive) heart failure: Secondary | ICD-10-CM

## 2019-01-04 MED ORDER — METOPROLOL TARTRATE 50 MG PO TABS: 50.0000 mg | ORAL_TABLET | Freq: Two times a day (BID) | ORAL | 3 refills | Status: DC

## 2019-01-04 NOTE — Progress Notes (Signed)
Virtual Visit via Telephone Note   This visit type was conducted due to national recommendations for restrictions regarding the COVID-19 Pandemic (e.g. social distancing) in an effort to limit this patient's exposure and mitigate transmission in our community.  Due to her co-morbid illnesses, this patient is at least at moderate risk for complications without adequate follow up.  This format is felt to be most appropriate for this patient at this time.  The patient did not have access to video technology/had technical difficulties with video requiring transitioning to audio format only (telephone).  All issues noted in this document were discussed and addressed.  No physical exam could be performed with this format.  Please refer to the patient's chart for her  consent to telehealth for [facility].   Date:  01/04/2019   ID:  Lori Rivera, DOB July 11, 1946, MRN [id number]  Patient Location: Home Provider Location: Home  PCP:  Lori Brothers, MD  Cardiologist:  Lori Rivera Electrophysiologist:  Lori Rivera   Evaluation Performed:  Consultation - Lori Rivera was referred by Lori Rivera for the evaluation of ICD.  Chief Complaint: ICD  History of Present Illness:    Lori Rivera is a 73 y.o. female with coronary artery disease status post multiple PCI's, systolic heart failure, hypertension, hyperlipidemia.  She has a Biotronik ICD in place.  Her last coronary angiogram 02/22/2018 she had an unsuccessful attempt at PCI of the ramus branch with diffuse coronary disease thought to be treated medically.  She reports getting shocked by her ICD last week.  She was in the kitchen getting a drink when she was shocked.  She was shocked 2 times.  She is currently feeling well.  Prior to her shock and after her shock, she had no major complaints.  She otherwise is feeling well.  The patient does not have symptoms concerning for COVID-19 infection (fever, chills, cough, or new shortness of breath).     Past Medical History:  Diagnosis Date  . Aortic regurgitation 09/24/2017  . Arthritis 09/24/2017  . CAD in native artery 03/07/2015   Overview:  1. Out of hospital cardiac arrest Aug 2016 with MI, shock, PCI and 2 Xience DES to mid and distal culprit RCA with diffuse LAD disease and hypothermia protocol 2. NonSTEMI,05/27/15  Conclusions: 1. Pt was turned down by CTS due to co-morbidities. Pt is for PCI to multiple lesions on LAD and RCA. Ramus is small, Lori Rivera treat medically. 2. Successful PCI / Xience Drug Eluting Stent of the m  . Chest pain 08/08/2015  . CHF (congestive heart failure) ([place]) 09/24/2017  . Chronic combined systolic and diastolic heart failure ([place]) 08/19/2015   Overview:  Echo 08/03/15: Left Ventricle Normal LV chamber size. Normal LV wall thickness. Mildly reduced overall LV systolic function. Estimated LVEF 45% Inferior, posterior and inferoseptal hypokinesis. Abnormal LV diastolic function, Grade 2, with echo evidence of elevated LA pressure.  Overview:  MUGA 20% 09/03/16 Overview:  Echo 08/03/15: Left Ventricle Normal LV chamber size. Normal LV wall   . Chronic systolic heart failure ([place]) 06/07/2015  . CKD (chronic kidney disease) stage 3, GFR 30-59 ml/min (HCC) 06/07/2015  . Coronary artery disease involving native heart with angina pectoris ([place]) 06/29/2017   Overview:  Added automatically from request for surgery [phone]  . 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 Essential hypertension 06/29/2017  . Hyperlipidemia 03/07/2015  . 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  Ischemic cardiomyopathy 11/18/2015   Overview:  EF 20-25%  . NSTEMI (non-ST elevated myocardial infarction) ([place]) 05/25/2015   Overview:  Cardiac cath 05/28/15 :Conclusions 1. Pt was turned down by CTS due to co-morbidities. Pt is for PCI to multiple lesions on LAD and RCA. Ramus is small, Lori Rivera treat  medically. 2. Successful PCI / Xience Drug Eluting Stent of the mid and distal Left Anterior Descending Coronary Artery. 3. Successful PCI / Xience Drug Eluting Stent of the ostial and mid Right Coronary Artery. 4. POBA to Di  . PNA (pneumonia) 08/08/2015  . Pneumonia due to infectious organism 08/19/2015   Overview:  Discharge summary 08/11/15: Her chest x-ray showed airspace opacities concerning for multifocal pneumonia and she Lori Rivera be discharged on oral Levaquin. Follow-up chest x-ray is recommended to ensure resolution.  . Shortness of breath 09/24/2017  . ST elevation myocardial infarction involving right coronary artery ([place]) 08/03/2015   Overview:  Due to ISR and acute stent thrombosis Cath 08/03/15: Diagnostic procedure: Left Heart Cath PCI procedure: Stent DES, Thrombectomy Complications: No Complications. Conclusions Diagnostic Summary Acute occlusion of RCA ostial stent thrombosis Severe stenosis of the mid RCA, partially ISR Patent LAD stent CTO of small circ system Interventional Summary Successful Thrombectomy/PTCA of the o  . STEMI (ST elevation myocardial infarction) ([place]) 08/03/2015  . Type 2 diabetes mellitus, without long-term current use of insulin ([place]) 03/07/2015  . Vitamin B 12 deficiency 09/24/2017  . Vitamin D deficiency 09/24/2017   Past Surgical History:  Procedure Laterality Date  . C-spine surgery    . CATARACT EXTRACTION, BILATERAL    . CHOLECYSTECTOMY    . CORONARY ANGIOPLASTY WITH STENT PLACEMENT    . INSERT / REPLACE / [place]     ICD, Medtronic  . LITHOTRIPSY    . URETEROSCOPY       Current Meds  Medication Sig  . aspirin EC 81 MG tablet Take 81 mg by mouth daily.   Marland Kitchen atorvastatin (LIPITOR) 20 MG tablet Take 20 mg by mouth daily at 6 PM.   . Dulaglutide (TRULICITY) 1.19 ER/7.4YC SOPN Inject 1 pen into the skin daily.  . furosemide (LASIX) 40 MG tablet Take 60 mg by mouth daily.   . metoprolol tartrate (LOPRESSOR) 25 MG tablet TAKE 1 TABLET BY MOUTH  TWICE A DAY  . nitroGLYCERIN (NITROSTAT) 0.4 MG SL tablet Place 0.4 mg under the tongue every 5 (five) minutes as needed.   . potassium chloride (MICRO-K) 10 MEQ CR capsule Take 10 mEq by mouth daily.     Allergies:   Codeine and Penicillins   Social History   Tobacco Use  . Smoking status: Former Smoker    Last attempt to quit: 06/07/1985    Years since quitting: 33.6  . Smokeless tobacco: Never Used  Substance Use Topics  . Alcohol use: No    Frequency: Never  . Drug use: No     Family Hx: The patient's family history includes CAD in her brother and brother; Cancer in her father and sister; Diabetes in her mother; Hypertension in her mother; Stroke in her mother.  ROS:   Please see the history of present illness.     All other systems reviewed and are negative.   Prior CV studies:   The following studies were reviewed today:    Labs/Other Tests and Data Reviewed:    EKG:  An ECG dated 10/19/2018 was personally reviewed today and demonstrated:  Sinus rhythm  Recent Labs: No results found for requested  labs within last 8760 hours.   Recent Lipid Panel No results found for: CHOL, TRIG, HDL, CHOLHDL, LDLCALC, LDLDIRECT  Wt Readings from Last 3 Encounters:  10/13/18 195 lb (88.5 kg)     Objective:    Vital Signs:  There were no vitals taken for this visit.   GEN:  no acute distress PSYCH:  normal affect  ASSESSMENT & PLAN:    1. Chronic systolic heart failure due to ischemic cardiomyopathy: Status post Biotronik ICD.  She did receive a shock which appears to be inappropriate.  We Tayah Idrovo bring her back into device clinic to have her VT zones changed.  We Cj Beecher put her VT zone, which is at 150, up to 180.  I Sharlyn Odonnel also increase her metoprolol to 50 mg twice a day.  This was discussed with device clinic. 2. Coronary artery disease: Most recent catheterization with severe disease.  Plan for medical management.  Per primary cardiology. 3. Hypertension with CKD stage III  and hypertensive heart disease: unable to check her BP. No changes. 4. Hyperlipidemia: Statin per primary cardiology  COVID-19 Education: The signs and symptoms of COVID-19 were discussed with the patient and how to seek care for testing (follow up with PCP or arrange E-visit).  The importance of social distancing was discussed today.  Time:   Today, I have spent 11 minutes with the patient with telehealth technology discussing the above problems.     Medication Adjustments/Labs and Tests Ordered: Current medicines are reviewed at length with the patient today.  Concerns regarding medicines are outlined above.   Tests Ordered: No orders of the defined types were placed in this encounter.   Medication Changes: No orders of the defined types were placed in this encounter.  Case discussed with primary cardiology  Disposition:  Follow up in 6 month(s)  Signed, Keldrick Pomplun Meredith Leeds, MD  01/04/2019 3:28 PM    [place]

## 2019-01-04 NOTE — Progress Notes (Signed)
No, she previously has had syncope without detection of VT VF.

## 2019-01-04 NOTE — Telephone Encounter (Signed)
Spoke with pt and pt granddaughter regarding f/u appt to increase VT zone to 180, change to monitor only, and increase metoprolol dose to 50mg  BID. Confirmed which pharmacy to submit prescription to. Pt agrees to increase metoprolol and scheduled appt for 01/13/19.

## 2019-01-04 NOTE — Telephone Encounter (Signed)
Pt agreeable to telehealth visit.     Virtual Visit Pre-Appointment Phone Call  "(Name), I am calling you today to discuss your upcoming appointment. We are currently trying to limit exposure to the virus that causes COVID-19 by seeing patients at home rather than in the office."  1. "What is the BEST phone number to call the day of the visit?" - include this in appointment notes  2. "Do you have or have access to (through a family member/friend) a smartphone with video capability that we can use for your visit?" a. If yes - list this number in appt notes as "cell" (if different from BEST phone #) and list the appointment type as a VIDEO visit in appointment notes b. If no - list the appointment type as a PHONE visit in appointment notes  3. Confirm consent - "In the setting of the current Covid19 crisis, you are scheduled for a (phone or video) visit with your provider on (date) at (time).  Just as we do with many in-office visits, in order for you to participate in this visit, we must obtain consent.  If you'd like, I can send this to your mychart (if signed up) or email for you to review.  Otherwise, I can obtain your verbal consent now.  All virtual visits are billed to your insurance company just like a normal visit would be.  By agreeing to a virtual visit, we'd like you to understand that the technology does not allow for your provider to perform an examination, and thus may limit your provider's ability to fully assess your condition. If your provider identifies any concerns that need to be evaluated in person, we will make arrangements to do so.  Finally, though the technology is pretty good, we cannot assure that it will always work on either your or our end, and in the setting of a video visit, we may have to convert it to a phone-only visit.  In either situation, we cannot ensure that we have a secure connection.  Are you willing to proceed?" STAFF: Did the patient verbally acknowledge  consent to telehealth visit? Document YES/NO here: YES  4. Advise patient to be prepared - "Two hours prior to your appointment, go ahead and check your blood pressure, pulse, oxygen saturation, and your weight (if you have the equipment to check those) and write them all down. When your visit starts, your provider will ask you for this information. If you have an Apple Watch or Kardia device, please plan to have heart rate information ready on the day of your appointment. Please have a pen and paper handy nearby the day of the visit as well."  5. Give patient instructions for MyChart download to smartphone OR Doximity/Doxy.me as below if video visit (depending on what platform provider is using)  6. Inform patient they will receive a phone call 15 minutes prior to their appointment time (may be from unknown caller ID) so they should be prepared to answer    TELEPHONE CALL NOTE  Lori Rivera has been deemed a candidate for a follow-up tele-health visit to limit community exposure during the Covid-19 pandemic. I spoke with the patient via phone to ensure availability of phone/video source, confirm preferred email & phone number, and discuss instructions and expectations.  I reminded [name] to be prepared with any vital sign and/or heart rhythm information that could potentially be obtained via home monitoring, at the time of her visit. I reminded [name]  to expect a phone call prior to her visit.  Stanton Kidney, RN 01/04/2019 9:33 AM   INSTRUCTIONS FOR DOWNLOADING THE MYCHART APP TO SMARTPHONE  - The patient must first make sure to have activated MyChart and know their login information - If Apple, go to [employer] and type in MyChart in the search bar and download the app. If Android, ask patient to go to [employer] and type in [place] in the search bar and download the app. The app is free but as with any other app downloads, their phone may require them to verify  saved payment information or Apple/Android password.  - The patient will need to then log into the app with their MyChart username and password, and select [facility] as their healthcare provider to link the account. When it is time for your visit, go to the MyChart app, find appointments, and click Begin Video Visit. Be sure to Select Allow for your device to access the Microphone and Camera for your visit. You will then be connected, and your provider will be with you shortly.  **If they have any issues connecting, or need assistance please contact MyChart service desk [phone])**  **If using a computer, in order to ensure the best quality for their visit they will need to use either of the following Internet Browsers: [employer], or Google Chrome**  IF USING DOXIMITY or DOXY.ME - The patient will receive a link just prior to their visit by text.     FULL LENGTH CONSENT FOR TELE-HEALTH VISIT   I hereby voluntarily request, consent and authorize [facility] and its employed or contracted physicians, physician assistants, nurse practitioners or other licensed health care professionals (the Practitioner), to provide me with telemedicine health care services (the "Services") as deemed necessary by the treating Practitioner. I acknowledge and consent to receive the Services by the Practitioner via telemedicine. I understand that the telemedicine visit will involve communicating with the Practitioner through live audiovisual communication technology and the disclosure of certain medical information by electronic transmission. I acknowledge that I have been given the opportunity to request an in-person assessment or other available alternative prior to the telemedicine visit and am voluntarily participating in the telemedicine visit.  I understand that I have the right to withhold or withdraw my consent to the use of telemedicine in the course of my care at any time, without  affecting my right to future care or treatment, and that the Practitioner or I may terminate the telemedicine visit at any time. I understand that I have the right to inspect all information obtained and/or recorded in the course of the telemedicine visit and may receive copies of available information for a reasonable fee.  I understand that some of the potential risks of receiving the Services via telemedicine include:  Marland Kitchen Delay or interruption in medical evaluation due to technological equipment failure or disruption; . Information transmitted may not be sufficient (e.g. poor resolution of images) to allow for appropriate medical decision making by the Practitioner; and/or  . In rare instances, security protocols could fail, causing a breach of personal health information.  Furthermore, I acknowledge that it is my responsibility to provide information about my medical history, conditions and care that is complete and accurate to the best of my ability. I acknowledge that Practitioner's advice, recommendations, and/or decision may be based on factors not within their control, such as incomplete or inaccurate data provided by me or distortions of diagnostic images or  specimens that may result from electronic transmissions. I understand that the practice of medicine is not an exact science and that Practitioner makes no warranties or guarantees regarding treatment outcomes. I acknowledge that I will receive a copy of this consent concurrently upon execution via email to the email address I last provided but may also request a printed copy by calling the office of [facility].    I understand that my insurance will be billed for this visit.   I have read or had this consent read to me. . I understand the contents of this consent, which adequately explains the benefits and risks of the Services being provided via telemedicine.  . I have been provided ample opportunity to ask questions regarding this  consent and the Services and have had my questions answered to my satisfaction. . I give my informed consent for the services to be provided through the use of telemedicine in my medical care  By participating in this telemedicine visit I agree to the above.

## 2019-01-10 ENCOUNTER — Telehealth: Payer: Self-pay | Admitting: Student

## 2019-01-10 NOTE — Telephone Encounter (Signed)
    COVID-19 Pre-Screening Questions:  . In the past 7 to 10 days have you had a cough,  shortness of breath, headache, congestion, fever (100 or greater) body aches, chills, sore throat, or sudden loss of taste or sense of smell? . Have you been around anyone with known Covid 19. . Have you been around anyone who is awaiting Covid 19 test results in the past 7 to 10 days? . Have you been around anyone who has been exposed to Covid 19, or has mentioned symptoms of Covid 19 within the past 7 to 10 days?  If you have any concerns/questions about symptoms patients report during screening (either on the phone or at [facility]). Contact the provider seeing the patient or DOD for further guidance.  If neither are available contact a member of the leadership team.   Pt states she has had a cough for 3 weeks. No other symptoms. Pt to call if any symptoms changes.

## 2019-01-13 ENCOUNTER — Other Ambulatory Visit: Payer: Self-pay

## 2019-01-13 ENCOUNTER — Ambulatory Visit (INDEPENDENT_AMBULATORY_CARE_PROVIDER_SITE_OTHER): Payer: Medicare Other | Admitting: Student

## 2019-01-13 DIAGNOSIS — Z95810 Presence of automatic (implantable) cardiac defibrillator: Secondary | ICD-10-CM | POA: Diagnosis not present

## 2019-01-13 DIAGNOSIS — I25119 Atherosclerotic heart disease of native coronary artery with unspecified angina pectoris: Secondary | ICD-10-CM

## 2019-01-13 DIAGNOSIS — I255 Ischemic cardiomyopathy: Secondary | ICD-10-CM

## 2019-01-13 LAB — CUP PACEART INCLINIC DEVICE CHECK
Battery Voltage: 3.1 V
Brady Statistic RA Percent Paced: 0 %
Brady Statistic RV Percent Paced: 0 %
Date Time Interrogation Session: 20200604163703
HighPow Impedance: 89 Ohm
Implantable Lead Implant Date: 20180412
Implantable Lead Implant Date: 20180412
Implantable Lead Location: 753859
Implantable Lead Location: 753860
Implantable Lead Model: 377
Implantable Lead Model: 402266
Implantable Lead Serial Number: 4970639
Implantable Lead Serial Number: 49730543
Implantable Pulse Generator Implant Date: 20180412
Lead Channel Impedance Value: 624 Ohm
Lead Channel Impedance Value: 725 Ohm
Lead Channel Pacing Threshold Amplitude: 0.6 V
Lead Channel Pacing Threshold Amplitude: 0.7 V
Lead Channel Pacing Threshold Pulse Width: 0.4 ms
Lead Channel Pacing Threshold Pulse Width: 0.75 ms
Lead Channel Sensing Intrinsic Amplitude: 3.6 mV
Lead Channel Setting Pacing Amplitude: 1.7 V
Lead Channel Setting Pacing Amplitude: 2.5 V
Lead Channel Setting Pacing Pulse Width: 0.4 ms
Lead Channel Setting Sensing Sensitivity: 0.8 mV
Pulse Gen Model: 404623
Pulse Gen Serial Number: 60967865

## 2019-01-13 NOTE — Progress Notes (Signed)
ICD check in clinic. Pt seen for inappropriate therapy for ST/AT ~150 bpm with 3 ATP and 2 Shock. BB increased. Reviewed episodes shows APPROPRIATE ATP in 04/14/18 with rates between 170-200. VT monitor zone increased to 171 from 150, Monitor ONLY. VT 2 therapy zone remains programmed at 200 bpm. Detection decreased to 42 beats (lowest detection allowed). Histogram distribution appropriate for patient and level of activity. Thresholds and sensing consistent with previous device measurements. Impedance trends stable over time. Device programmed at appropriate safety margins. Remaining battery capacity 100%. Pt enrolled in remote follow-up. Sees Dr. Bettina Gavia 01/17/2019 and on Call back for Dr. Curt Bears 06/2019. Reviewed shock plan.  Episode 04/14/2018

## 2019-01-14 ENCOUNTER — Telehealth: Payer: Self-pay | Admitting: Cardiology

## 2019-01-14 ENCOUNTER — Ambulatory Visit: Payer: Medicare Other | Admitting: Cardiology

## 2019-01-14 NOTE — Telephone Encounter (Signed)
Virtual Visit Pre-Appointment Phone Call  "(Name), I am calling you today to discuss your upcoming appointment. We are currently trying to limit exposure to the virus that causes COVID-19 by seeing patients at home rather than in the office."  1. "What is the BEST phone number to call the day of the visit?" - include this in appointment notes  2. Do you have or have access to (through a family member/friend) a smartphone with video capability that we can use for your visit?" a. If yes - list this number in appt notes as cell (if different from BEST phone #) and list the appointment type as a VIDEO visit in appointment notes b. If no - list the appointment type as a PHONE visit in appointment notes  3. Confirm consent - "In the setting of the current Covid19 crisis, you are scheduled for a (phone or video) visit with your provider on (date) at (time).  Just as we do with many in-office visits, in order for you to participate in this visit, we must obtain consent.  If you'd like, I can send this to your mychart (if signed up) or email for you to review.  Otherwise, I can obtain your verbal consent now.  All virtual visits are billed to your insurance company just like a normal visit would be.  By agreeing to a virtual visit, we'd like you to understand that the technology does not allow for your provider to perform an examination, and thus may limit your provider's ability to fully assess your condition. If your provider identifies any concerns that need to be evaluated in person, we will make arrangements to do so.  Finally, though the technology is pretty good, we cannot assure that it will always work on either your or our end, and in the setting of a video visit, we may have to convert it to a phone-only visit.  In either situation, we cannot ensure that we have a secure connection.  Are you willing to proceed?" STAFF: Did the patient verbally acknowledge consent to telehealth visit? Document  YES/NO here: Yes per pt.  4. Advise patient to be prepared - "Two hours prior to your appointment, go ahead and check your blood pressure, pulse, oxygen saturation, and your weight (if you have the equipment to check those) and write them all down. When your visit starts, your provider will ask you for this information. If you have an Apple Watch or Kardia device, please plan to have heart rate information ready on the day of your appointment. Please have a pen and paper handy nearby the day of the visit as well."  5. Give patient instructions for MyChart download to smartphone OR Doximity/Doxy.me as below if video visit (depending on what platform provider is using)  6. Inform patient they will receive a phone call 15 minutes prior to their appointment time (may be from unknown caller ID) so they should be prepared to answer    TELEPHONE CALL NOTE  Lori Rivera has been deemed a candidate for a follow-up tele-health visit to limit community exposure during the Covid-19 pandemic. I spoke with the patient via phone to ensure availability of phone/video source, confirm preferred email & phone number, and discuss instructions and expectations.  I reminded Lori Rivera to be prepared with any vital sign and/or heart rhythm information that could potentially be obtained via home monitoring, at the time of her visit. I reminded Lori Rivera to expect a phone call  prior to her visit.  Lori Rivera 01/14/2019 3:37 PM   INSTRUCTIONS FOR DOWNLOADING THE MYCHART APP TO SMARTPHONE  - The patient must first make sure to have activated MyChart and know their login information - If Apple, go to [employer] and type in MyChart in the search bar and download the app. If Android, ask patient to go to [employer] and type in [place] in the search bar and download the app. The app is free but as with any other app downloads, their phone may require them to verify saved payment information or  Apple/Android password.  - The patient will need to then log into the app with their MyChart username and password, and select [facility] as their healthcare provider to link the account. When it is time for your visit, go to the MyChart app, find appointments, and click Begin Video Visit. Be sure to Select Allow for your device to access the Microphone and Camera for your visit. You will then be connected, and your provider will be with you shortly.  **If they have any issues connecting, or need assistance please contact MyChart service desk [phone])**  **If using a computer, in order to ensure the best quality for their visit they will need to use either of the following Internet Browsers: [employer], or Google Chrome**  IF USING DOXIMITY or DOXY.ME - The patient will receive a link just prior to their visit by text.     FULL LENGTH CONSENT FOR TELE-HEALTH VISIT   I hereby voluntarily request, consent and authorize [facility] and its employed or contracted physicians, physician assistants, nurse practitioners or other licensed health care professionals (the Practitioner), to provide me with telemedicine health care services (the Services") as deemed necessary by the treating Practitioner. I acknowledge and consent to receive the Services by the Practitioner via telemedicine. I understand that the telemedicine visit will involve communicating with the Practitioner through live audiovisual communication technology and the disclosure of certain medical information by electronic transmission. I acknowledge that I have been given the opportunity to request an in-person assessment or other available alternative prior to the telemedicine visit and am voluntarily participating in the telemedicine visit.  I understand that I have the right to withhold or withdraw my consent to the use of telemedicine in the course of my care at any time, without affecting my right to future care  or treatment, and that the Practitioner or I may terminate the telemedicine visit at any time. I understand that I have the right to inspect all information obtained and/or recorded in the course of the telemedicine visit and may receive copies of available information for a reasonable fee.  I understand that some of the potential risks of receiving the Services via telemedicine include:   Delay or interruption in medical evaluation due to technological equipment failure or disruption;  Information transmitted may not be sufficient (e.g. poor resolution of images) to allow for appropriate medical decision making by the Practitioner; and/or   In rare instances, security protocols could fail, causing a breach of personal health information.  Furthermore, I acknowledge that it is my responsibility to provide information about my medical history, conditions and care that is complete and accurate to the best of my ability. I acknowledge that Practitioner's advice, recommendations, and/or decision may be based on factors not within their control, such as incomplete or inaccurate data provided by me or distortions of diagnostic images or specimens that may result from electronic  transmissions. I understand that the practice of medicine is not an exact science and that Practitioner makes no warranties or guarantees regarding treatment outcomes. I acknowledge that I will receive a copy of this consent concurrently upon execution via email to the email address I last provided but may also request a printed copy by calling the office of [facility].    I understand that my insurance will be billed for this visit.   I have read or had this consent read to me.  I understand the contents of this consent, which adequately explains the benefits and risks of the Services being provided via telemedicine.   I have been provided ample opportunity to ask questions regarding this consent and the Services and have had  my questions answered to my satisfaction.  I give my informed consent for the services to be provided through the use of telemedicine in my medical care  By participating in this telemedicine visit I agree to the above.

## 2019-01-16 NOTE — Progress Notes (Signed)
Virtual Visit via Telephone Note   This visit type was conducted due to national recommendations for restrictions regarding the COVID-19 Pandemic (e.g. social distancing) in an effort to limit this patient's exposure and mitigate transmission in our community.  Due to her co-morbid illnesses, this patient is at least at moderate risk for complications without adequate follow up.  This format is felt to be most appropriate for this patient at this time.  The patient did not have access to video technology/had technical difficulties with video requiring transitioning to audio format only (telephone).  All issues noted in this document were discussed and addressed.  No physical exam could be performed with this format.  Please refer to the patient's chart for her  consent to telehealth for [facility].   Date:  01/17/2019   ID:  Lori Rivera, DOB [birth year], MRN [id number]  Patient Location: Home Provider Location: Office  PCP:  Garwin Brothers, MD  Cardiologist:  Shirlee More, MD  Electrophysiologist:  Constance Haw, MD   Evaluation Performed:  Follow-Up Visit  Chief Complaint:  73yo female PMH CAD, heart failure, PAT presents with chief complaint of episodes of heart "fluttering" associated with SOB with no chest pain, dizziness.   History of Present Illness:    Lori Rivera is a 73 y.o. female with a hx of coronary artery disease multiple PCI's and multiple siblings heart failure last EF 45% hypertension dyslipidemia and type 2 diabetes.  She also has a [employer]ron[employer] ICD.  Last seen by me 10/13/18.  She was admitted to and underwent left heart catheterization [facility] 02/23/2018.  She had unsuccessful PCI of the proximal ramus artery due to non-dilatable lesion.  She is advised medical treatment and was not felt to be a good candidate for bypass surgery.  During that admission to the hospital she also had hemoptysis, stable heart failure and CKD.  Device telemetry  reported  03/02/18 with PAF 15 minutes.  She reports a "flutter" feeling in her heart every once in awhile. Reports it happens daily and lasts about a minute, goes away on its own. Makes her feel "funny". Not associated dizziness, chest pain, pre-syncope. Reports some SOB with these episodes. States they happen more often while she is doing activity such as sweeping or doing dishes. She is fearful of her ICD firing again and reassurance was provided. Her daughter and granddaughter help her with her medications and she has caretakers at home as well.   Checks weight daily - reports it is stable. Reports lower extremity edema bilaterally that is unchanged from baseline.   The patient does not have symptoms concerning for COVID-19 infection (fever, chills, cough, or new shortness of breath).    Past Medical History:  Diagnosis Date  . Aortic regurgitation 09/24/2017  . Arthritis 09/24/2017  . CAD in native artery 03/07/2015   Overview:  1. Out of hospital cardiac arrest Aug 2016 with MI, shock, PCI and 2 Xience DES to mid and distal culprit RCA with diffuse LAD disease and hypothermia protocol 2. NonSTEMI,05/27/15  Conclusions: 1. Pt was turned down by CTS due to co-morbidities. Pt is for PCI to multiple lesions on LAD and RCA. Ramus is small, will treat medically. 2. Successful PCI / Xience Drug Eluting Stent of the m  . Chest pain 08/08/2015  . CHF (congestive heart failure) ([place]) 09/24/2017  . Chronic combined systolic and diastolic heart failure ([place]) 08/19/2015   Overview:  Echo 08/03/15: Left Ventricle Normal LV chamber  size. Normal LV wall thickness. Mildly reduced overall LV systolic function. Estimated LVEF 45% Inferior, posterior and inferoseptal hypokinesis. Abnormal LV diastolic function, Grade 2, with echo evidence of elevated LA pressure.  Overview:  MUGA 20% 09/03/16 Overview:  Echo 08/03/15: Left Ventricle Normal LV chamber size. Normal LV wall   . Chronic systolic heart failure ([place])  06/07/2015  . CKD (chronic kidney disease) stage 3, GFR 30-59 ml/min (HCC) 06/07/2015  . Coronary artery disease involving native heart with angina pectoris ([place]) 06/29/2017   Overview:  Added automatically from request for surgery [id number]  . 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 Essential hypertension 06/29/2017  . Hyperlipidemia 03/07/2015  . 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 Ischemic cardiomyopathy 11/18/2015   Overview:  EF 20-25%  . NSTEMI (non-ST elevated myocardial infarction) ([place]) 05/25/2015   Overview:  Cardiac cath 05/28/15 :Conclusions 1. Pt was turned down by CTS due to co-morbidities. Pt is for PCI to multiple lesions on LAD and RCA. Ramus is small, will treat medically. 2. Successful PCI / Xience Drug Eluting Stent of the mid and distal Left Anterior Descending Coronary Artery. 3. Successful PCI / Xience Drug Eluting Stent of the ostial and mid Right Coronary Artery. 4. POBA to Di  . PNA (pneumonia) 08/08/2015  . Pneumonia due to infectious organism 08/19/2015   Overview:  Discharge summary 08/11/15: Her chest x-ray showed airspace opacities concerning for multifocal pneumonia and she will be discharged on oral Levaquin. Follow-up chest x-ray is recommended to ensure resolution.  . Shortness of breath 09/24/2017  . ST elevation myocardial infarction involving right coronary artery ([place]) 08/03/2015   Overview:  Due to ISR and acute stent thrombosis Cath 08/03/15: Diagnostic procedure: Left Heart Cath PCI procedure: Stent DES, Thrombectomy Complications: No Complications. Conclusions Diagnostic Summary Acute occlusion of RCA ostial stent thrombosis Severe stenosis of the mid RCA, partially ISR Patent LAD stent CTO of small circ system Interventional Summary Successful Thrombectomy/PTCA of the o  . STEMI (ST elevation myocardial infarction) ([place]) 08/03/2015  . Type 2 diabetes  mellitus, without long-term current use of insulin ([place]) 03/07/2015  . Vitamin B 12 deficiency 09/24/2017  . Vitamin D deficiency 09/24/2017   Past Surgical History:  Procedure Laterality Date  . C-spine surgery    . CATARACT EXTRACTION, BILATERAL    . CHOLECYSTECTOMY    . CORONARY ANGIOPLASTY WITH STENT PLACEMENT    . INSERT / REPLACE / [place]     ICD, Medtronic  . LITHOTRIPSY    . URETEROSCOPY       Current Meds  Medication Sig  . aspirin EC 81 MG tablet Take 81 mg by mouth daily.   Marland Kitchen atorvastatin (LIPITOR) 20 MG tablet Take 20 mg by mouth daily at 6 PM.   . Dulaglutide (TRULICITY) 7.74 JO/8.7OM SOPN Inject 1 pen into the skin once a week.   . furosemide (LASIX) 40 MG tablet Take 60 mg by mouth daily.   . metoprolol tartrate (LOPRESSOR) 50 MG tablet Take 1 tablet (50 mg total) by mouth 2 (two) times daily.  . nitroGLYCERIN (NITROSTAT) 0.4 MG SL tablet Place 0.4 mg under the tongue every 5 (five) minutes as needed.   . potassium chloride (MICRO-K) 10 MEQ CR capsule Take 10 mEq by mouth daily.  . TRESIBA FLEXTOUCH 100 UNIT/ML SOPN FlexTouch Pen 10 Units daily.     Allergies:   Codeine and Penicillins   Social  History   Tobacco Use  . Smoking status: Former Smoker    Last attempt to quit: 06/07/1985    Years since quitting: 33.6  . Smokeless tobacco: Never Used  Substance Use Topics  . Alcohol use: No    Frequency: Never  . Drug use: No     Family Hx: The patient's family history includes CAD in her brother and brother; Cancer in her father and sister; Diabetes in her mother; Hypertension in her mother; Stroke in her mother.  ROS:   Please see the history of present illness.    Review of Systems  Constitution: Negative for chills, fever and malaise/fatigue.  Cardiovascular: Positive for leg swelling ("no change") and palpitations ("flutter"). Negative for chest pain, near-syncope and syncope.  Respiratory: Positive for shortness of breath (with heart "flutter"  episodes). Negative for cough and wheezing.    All other systems reviewed and are negative.   Prior CV studies:   The following studies were reviewed today:  01/13/19 ICD in Clinic Device Check: Per Satira Mccallum [place]:  ICD check in clinic. Normal device function. Thresholds and sensing consistent with previous device measurements. Impedance trends stable over time. Pt seen for inappropriate therapy for ST/AT ~150 bpm with 3 ATP and 2 Shock. BB increased. Reviewed episodes shows APPROPRIATE ATP in 04/14/18 with rates between 170-200. VT monitor zone increased to 171 from 150, Monitor ONLY. VT 2 therapy zone remains programmed at 200 bpm. Detection decreased to 42 beats (lowest detection allowed). Histogram distribution appropriate for patient and level of activity.  Device programmed at appropriate safety margins. Remaining battery capacity 100%. Pt enrolled in remote follow-up. Sees Dr. Bettina Gavia 01/17/2019 and on Call back for Dr. Curt Bears 06/2019. Reviewed shock plan.    Labs/Other Tests and Data Reviewed:    EKG:  No ECG reviewed.  Recent Labs: No results found for requested labs within last 8760 hours.   Recent Lipid Panel No results found for: CHOL, TRIG, HDL, CHOLHDL, LDLCALC, LDLDIRECT  Wt Readings from Last 3 Encounters:  01/17/19 192 lb 12.8 oz (87.5 kg)  10/13/18 195 lb (88.5 kg)     Objective:    Vital Signs:  BP (!) 136/56 (BP Location: Left Arm, Patient Position: Sitting)   Pulse 73   Wt 192 lb 12.8 oz (87.5 kg)   BMI 28.47 kg/m    VITAL SIGNS:  reviewed  ASSESSMENT & PLAN:    1. CAD - Stable. Denies chest pain. NYHA class I-II. Some SOB with activity which she attributes to her heart "fluttering". Continue GDMT aspirin, metoprolol, statin.  2. Chronic combined heart failure - Reports bilateral lower extremity unchanged from baseline. Weighs herself daily, reports weights are stable. Continue current diuretic and potassium supplement. Recommend low sodium diet.  Denies PND, orthopnea.  3. Hypertensive heart disease and kidney disease III - BP stable today. Reports she checks her BP intermittently. CMP, CBC today.   4. Palpitations - Reports episodes of heart "fluttering" occurring daily lasting one minute that resolve with rest. Recent device interrogation shows episode of ST/AT up to 150bpm.   Obtain TSH.   Increase Metoprolol 50mg  to three times daily.  5. Atrial tachycardia - Noted on device check. As above, will increase Metoprolol frequency.  6. Dual ICD - Reports palpitations, as above. She is very fearful of her device firing again - device parameters were adjusted 01/13/19 and reassurance was provided. Continue to follow with device clinic and Dr. Curt Bears.   7. HLD - Continue high intensity statin.  COVID-19 Education: The signs and symptoms of COVID-19 were discussed with the patient and how to seek care for testing (follow up with PCP or arrange E-visit).  The importance of social distancing was discussed today.  Time:   Today, I have spent 25 minutes with the patient with telehealth technology discussing the above problems. An additional 10 minutes was spent reviewing the chart.     Medication Adjustments/Labs and Tests Ordered: Current medicines are reviewed at length with the patient today.  Concerns regarding medicines are outlined above.   Tests Ordered: No orders of the defined types were placed in this encounter.   Medication Changes: No orders of the defined types were placed in this encounter.   Disposition:  Follow up in 4 week(s)  Signed, Shirlee More, MD  01/17/2019 3:05 PM    [place]

## 2019-01-17 ENCOUNTER — Telehealth (INDEPENDENT_AMBULATORY_CARE_PROVIDER_SITE_OTHER): Payer: Medicare Other | Admitting: Cardiology

## 2019-01-17 ENCOUNTER — Encounter: Payer: Self-pay | Admitting: Cardiology

## 2019-01-17 ENCOUNTER — Other Ambulatory Visit: Payer: Self-pay

## 2019-01-17 VITALS — BP 136/56 | HR 73 | Wt 192.8 lb

## 2019-01-17 DIAGNOSIS — I10 Essential (primary) hypertension: Secondary | ICD-10-CM

## 2019-01-17 DIAGNOSIS — I255 Ischemic cardiomyopathy: Secondary | ICD-10-CM

## 2019-01-17 DIAGNOSIS — I25119 Atherosclerotic heart disease of native coronary artery with unspecified angina pectoris: Secondary | ICD-10-CM

## 2019-01-17 DIAGNOSIS — N183 Chronic kidney disease, stage 3 unspecified: Secondary | ICD-10-CM

## 2019-01-17 DIAGNOSIS — R002 Palpitations: Secondary | ICD-10-CM | POA: Insufficient documentation

## 2019-01-17 DIAGNOSIS — I5042 Chronic combined systolic (congestive) and diastolic (congestive) heart failure: Secondary | ICD-10-CM

## 2019-01-17 DIAGNOSIS — Z95810 Presence of automatic (implantable) cardiac defibrillator: Secondary | ICD-10-CM

## 2019-01-17 DIAGNOSIS — E782 Mixed hyperlipidemia: Secondary | ICD-10-CM

## 2019-01-17 DIAGNOSIS — I4719 Other supraventricular tachycardia: Secondary | ICD-10-CM | POA: Insufficient documentation

## 2019-01-17 DIAGNOSIS — I471 Supraventricular tachycardia: Secondary | ICD-10-CM

## 2019-01-17 DIAGNOSIS — I1310 Hypertensive heart and chronic kidney disease without heart failure, with stage 1 through stage 4 chronic kidney disease, or unspecified chronic kidney disease: Secondary | ICD-10-CM

## 2019-01-17 MED ORDER — METOPROLOL TARTRATE 50 MG PO TABS: 50.0000 mg | ORAL_TABLET | Freq: Three times a day (TID) | ORAL | 0 refills | Status: DC

## 2019-01-17 NOTE — Patient Instructions (Addendum)
Medication Instructions:  Your physician has recommended you make the following change in your medication:   INCREASE metoprolol tartrate (lopressor) 50 mg: Take 1 tablet three time daily   If you need a refill on your cardiac medications before your next appointment, please call your pharmacy.   Lab work: Your physician recommends that you return for lab work within the next week: CMP, CBC, TSH. Please go to the [place] office for lab work, no appointment needed. No need to fast beforehand.   If you have labs (blood work) drawn today and your tests are completely normal, you will receive your results only by: Marland Kitchen MyChart Message (if you have MyChart) OR . A paper copy in the mail If you have any lab test that is abnormal or we need to change your treatment, we will call you to review the results.  Testing/Procedures: None  Follow-Up: At [facility], you and your health needs are our priority.  As part of our continuing mission to provide you with exceptional heart care, we have created designated Provider Care Teams.  These Care Teams include your primary Cardiologist (physician) and Advanced Practice Providers (APPs -  Physician Assistants and Nurse Practitioners) who all work together to provide you with the care you need, when you need it. You will need a virtual follow up appointment in 4 weeks: 02/14/2019, at 3:00 pm.

## 2019-01-22 LAB — COMPREHENSIVE METABOLIC PANEL
ALT: 11 IU/L (ref 0–32)
AST: 18 IU/L (ref 0–40)
Albumin/Globulin Ratio: 1.6 (ref 1.2–2.2)
Albumin: 3.7 g/dL (ref 3.7–4.7)
Alkaline Phosphatase: 73 IU/L (ref 39–117)
BUN/Creatinine Ratio: 19 (ref 12–28)
BUN: 28 mg/dL — ABNORMAL HIGH (ref 8–27)
Bilirubin Total: 0.5 mg/dL (ref 0.0–1.2)
CO2: 23 mmol/L (ref 20–29)
Calcium: 8.8 mg/dL (ref 8.7–10.3)
Chloride: 102 mmol/L (ref 96–106)
Creatinine, Ser: 1.45 mg/dL — ABNORMAL HIGH (ref 0.57–1.00)
GFR calc Af Amer: 41 mL/min/{1.73_m2} — ABNORMAL LOW (ref >59)
GFR calc non Af Amer: 36 mL/min/{1.73_m2} — ABNORMAL LOW (ref >59)
Globulin, Total: 2.3 g/dL (ref 1.5–4.5)
Glucose: 245 mg/dL — ABNORMAL HIGH (ref 65–99)
Potassium: 3.4 mmol/L — ABNORMAL LOW (ref 3.5–5.2)
Sodium: 142 mmol/L (ref 134–144)
Total Protein: 6 g/dL (ref 6.0–8.5)

## 2019-01-22 LAB — TSH: TSH: 2.03 u[IU]/mL (ref 0.450–4.500)

## 2019-01-22 LAB — CBC
Hematocrit: 39.1 % (ref 34.0–46.6)
Hemoglobin: 13.1 g/dL (ref 11.1–15.9)
MCH: 30.5 pg (ref 26.6–33.0)
MCHC: 33.5 g/dL (ref 31.5–35.7)
MCV: 91 fL (ref 79–97)
Platelets: 244 10*3/uL (ref 150–450)
RBC: 4.3 x10E6/uL (ref 3.77–5.28)
RDW: 11.8 % (ref 11.7–15.4)
WBC: 7.9 10*3/uL (ref 3.4–10.8)

## 2019-02-13 NOTE — Progress Notes (Deleted)
Cardiology Office Note:    Date:  02/13/2019   ID:  Lori Rivera, DOB [birth year], MRN [id number]  PCP:  Garwin Brothers, MD  Cardiologist:  Shirlee More, MD    Referring MD: Garwin Brothers, MD    ASSESSMENT:    No diagnosis found. PLAN:    In order of problems listed above:  1. ***   Next appointment: ***   Medication Adjustments/Labs and Tests Ordered: Current medicines are reviewed at length with the patient today.  Concerns regarding medicines are outlined above.  No orders of the defined types were placed in this encounter.  No orders of the defined types were placed in this encounter.   No chief complaint on file.   History of Present Illness:    Lori Rivera is a 73 y.o. female with a hx of coronary artery disease multiple PCI's and multiple siblings heart failure last EF 45% hypertension dyslipidemia and type 2 diabetes.  She also has an ICD.  Last seen by me at 01/17/2019 virtual visit.  At that time device check showed episodes of atrial tachycardia and the beta-blocker dose was increased.  She was admitted to and underwent left heart catheterization [facility] 02/23/2018.  She had unsuccessful PCI of the proximal ramus artery due to non-dilatable lesion.  She is advised medical treatment and was not felt to be a good candidate for bypass surgery.  During that admission to the hospital she also had hemoptysis, stable heart failure and CKD.  Device telemetry reported  03/02/28 with PAF 15 minutes. * last seen ***. Compliance with diet, lifestyle and medications: *** Past Medical History:  Diagnosis Date  . Aortic regurgitation 09/24/2017  . Arthritis 09/24/2017  . CAD in native artery 03/07/2015   Overview:  1. Out of hospital cardiac arrest Aug 2016 with MI, shock, PCI and 2 Xience DES to mid and distal culprit RCA with diffuse LAD disease and hypothermia protocol 2. NonSTEMI,05/27/15  Conclusions: 1. Pt was turned down by CTS due to co-morbidities.  Pt is for PCI to multiple lesions on LAD and RCA. Ramus is small, will treat medically. 2. Successful PCI / Xience Drug Eluting Stent of the m  . Chest pain 08/08/2015  . CHF (congestive heart failure) ([place]) 09/24/2017  . Chronic combined systolic and diastolic heart failure ([place]) 08/19/2015   Overview:  Echo 08/03/15: Left Ventricle Normal LV chamber size. Normal LV wall thickness. Mildly reduced overall LV systolic function. Estimated LVEF 45% Inferior, posterior and inferoseptal hypokinesis. Abnormal LV diastolic function, Grade 2, with echo evidence of elevated LA pressure.  Overview:  MUGA 20% 09/03/16 Overview:  Echo 08/03/15: Left Ventricle Normal LV chamber size. Normal LV wall   . Chronic systolic heart failure ([place]) 06/07/2015  . CKD (chronic kidney disease) stage 3, GFR 30-59 ml/min (HCC) 06/07/2015  . Coronary artery disease involving native heart with angina pectoris ([place]) 06/29/2017   Overview:  Added automatically from request for surgery [id number]  . 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 Essential hypertension 06/29/2017  . Hyperlipidemia 03/07/2015  . 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 Ischemic cardiomyopathy 11/18/2015   Overview:  EF 20-25%  . NSTEMI (non-ST elevated myocardial infarction) ([place]) 05/25/2015   Overview:  Cardiac cath 05/28/15 :Conclusions 1. Pt was turned down by CTS due to co-morbidities. Pt is for PCI to multiple lesions on LAD and RCA. Ramus is small, will treat  medically. 2. Successful PCI / Xience Drug Eluting Stent of the mid and distal Left Anterior Descending Coronary Artery. 3. Successful PCI / Xience Drug Eluting Stent of the ostial and mid Right Coronary Artery. 4. POBA to Di  . PNA (pneumonia) 08/08/2015  . Pneumonia due to infectious organism 08/19/2015   Overview:  Discharge summary 08/11/15: Her chest x-ray showed airspace opacities concerning  for multifocal pneumonia and she will be discharged on oral Levaquin. Follow-up chest x-ray is recommended to ensure resolution.  . Shortness of breath 09/24/2017  . ST elevation myocardial infarction involving right coronary artery ([place]) 08/03/2015   Overview:  Due to ISR and acute stent thrombosis Cath 08/03/15: Diagnostic procedure: Left Heart Cath PCI procedure: Stent DES, Thrombectomy Complications: No Complications. Conclusions Diagnostic Summary Acute occlusion of RCA ostial stent thrombosis Severe stenosis of the mid RCA, partially ISR Patent LAD stent CTO of small circ system Interventional Summary Successful Thrombectomy/PTCA of the o  . STEMI (ST elevation myocardial infarction) ([place]) 08/03/2015  . Type 2 diabetes mellitus, without long-term current use of insulin ([place]) 03/07/2015  . Vitamin B 12 deficiency 09/24/2017  . Vitamin D deficiency 09/24/2017    Past Surgical History:  Procedure Laterality Date  . C-spine surgery    . CATARACT EXTRACTION, BILATERAL    . CHOLECYSTECTOMY    . CORONARY ANGIOPLASTY WITH STENT PLACEMENT    . INSERT / REPLACE / [place]     ICD, Medtronic  . LITHOTRIPSY    . URETEROSCOPY      Current Medications: No outpatient medications have been marked as taking for the 02/14/19 encounter (Appointment) with [name], MD.     Allergies:   Codeine and Penicillins   Social History   Socioeconomic History  . Marital status: Widowed    Spouse name: Not on file  . Number of children: Not on file  . Years of education: Not on file  . Highest education level: Not on file  Occupational History  . Not on file  Social Needs  . Financial resource strain: Not on file  . Food insecurity    Worry: Not on file    Inability: Not on file  . Transportation needs    Medical: Not on file    Non-medical: Not on file  Tobacco Use  . Smoking status: Former Smoker    Quit date: 06/07/1985    Years since quitting: 33.7  . Smokeless tobacco: Never  Used  Substance and Sexual Activity  . Alcohol use: No    Frequency: Never  . Drug use: No  . Sexual activity: Not on file  Lifestyle  . Physical activity    Days per week: Not on file    Minutes per session: Not on file  . Stress: Not on file  Relationships  . Social Herbalist on phone: Not on file    Gets together: Not on file    Attends religious service: Not on file    Active member of club or organization: Not on file    Attends meetings of clubs or organizations: Not on file    Relationship status: Not on file  Other Topics Concern  . Not on file  Social History Narrative  . Not on file     Family History: The patient's ***family history includes CAD in her brother and brother; Cancer in her father and sister; Diabetes in her mother; Hypertension in her mother; Stroke in her mother. ROS:   Please  see the history of present illness.    All other systems reviewed and are negative.  EKGs/Labs/Other Studies Reviewed:    The following studies were reviewed today:  EKG:  EKG ordered today and personally reviewed.  The ekg ordered today demonstrates ***  01/13/19 ICD in Clinic Device Check: Per Satira Mccallum [place]:  ICD check in clinic. Normal device function. Thresholds and sensing consistent with previous device measurements. Impedance trends stable over time. Pt seen for inappropriate therapy for ST/AT ~150 bpm with 3 ATP and 2 Shock. BB increased. Reviewed episodes shows APPROPRIATE ATP in 04/14/18 with rates between 170-200. VT monitor zone increased to 171 from 150, Monitor ONLY. VT 2 therapy zone remains programmed at 200 bpm. Detection decreased to 42 beats (lowest detection allowed). Histogram distribution appropriate for patient and level of activity.  Device programmed at appropriate safety margins. Remaining battery capacity 100%. Pt enrolled in remote follow-up. Sees Dr. Bettina Gavia 01/17/2019 and on Call back for Dr. Curt Bears 06/2019. Reviewed shock plan.    Recent Labs: 01/21/2019: ALT 11; BUN 28; Creatinine, Ser 1.45; Hemoglobin 13.1; Platelets 244; Potassium 3.4; Sodium 142; TSH 2.030  Recent Lipid Panel No results found for: CHOL, TRIG, HDL, CHOLHDL, VLDL, LDLCALC, LDLDIRECT  Physical Exam:    VS:  There were no vitals taken for this visit.    Wt Readings from Last 3 Encounters:  01/17/19 192 lb 12.8 oz (87.5 kg)  10/13/18 195 lb (88.5 kg)     GEN: *** Well nourished, well developed in no acute distress HEENT: Normal NECK: No JVD; No carotid bruits LYMPHATICS: No lymphadenopathy CARDIAC: ***RRR, no murmurs, rubs, gallops RESPIRATORY:  Clear to auscultation without rales, wheezing or rhonchi  ABDOMEN: Soft, non-tender, non-distended MUSCULOSKELETAL:  No edema; No deformity  SKIN: Warm and dry NEUROLOGIC:  Alert and oriented x 3 PSYCHIATRIC:  Normal affect    Signed, Shirlee More, MD  02/13/2019 12:29 PM    [place]

## 2019-02-14 ENCOUNTER — Other Ambulatory Visit: Payer: Self-pay

## 2019-02-14 ENCOUNTER — Telehealth: Payer: Medicare Other | Admitting: Cardiology

## 2019-02-16 ENCOUNTER — Other Ambulatory Visit: Payer: Self-pay | Admitting: Cardiology

## 2019-03-06 NOTE — Progress Notes (Deleted)
Cardiology Office Note:    Date:  03/06/2019   ID:  Lori Rivera, DOB 1946/02/24, MRN [id number]  PCP:  Garwin Brothers, MD  Cardiologist:  Shirlee More, MD    Referring MD: Garwin Brothers, MD    ASSESSMENT:    No diagnosis found. PLAN:    In order of problems listed above:  1. ***   Next appointment: ***   Medication Adjustments/Labs and Tests Ordered: Current medicines are reviewed at length with the patient today.  Concerns regarding medicines are outlined above.  No orders of the defined types were placed in this encounter.  No orders of the defined types were placed in this encounter.   No chief complaint on file.   History of Present Illness:    Lori Rivera is a 73 y.o. female with a hx of  coronary artery disease multiple PCI's and multiple siblings heart failure last EF 45% hypertension dyslipidemia and type 2 diabetes.  She also has a [employer] ICD. She was admitted to and underwent left heart catheterization [facility] 02/23/2018.  She had unsuccessful PCI of the proximal ramus artery due to non-dilatable lesion.  She is advised medical treatment and was not felt to be a good candidate for bypass surgery.  During that admission to the hospital she also had hemoptysis, stable heart failure and CKD. Device telemetry reported  03/02/18 with PAF 15 minutes.  She was last seen 01/17/2019. Compliance with diet, lifestyle and medications: *** Past Medical History:  Diagnosis Date  . Aortic regurgitation 09/24/2017  . Arthritis 09/24/2017  . CAD in native artery 03/07/2015   Overview:  1. Out of hospital cardiac arrest Aug 2016 with MI, shock, PCI and 2 Xience DES to mid and distal culprit RCA with diffuse LAD disease and hypothermia protocol 2. NonSTEMI,05/27/15  Conclusions: 1. Pt was turned down by CTS due to co-morbidities. Pt is for PCI to multiple lesions on LAD and RCA. Ramus is small, will treat medically. 2. Successful PCI / Xience Drug Eluting  Stent of the m  . Chest pain 08/08/2015  . CHF (congestive heart failure) ([place]) 09/24/2017  . Chronic combined systolic and diastolic heart failure ([place]) 08/19/2015   Overview:  Echo 08/03/15: Left Ventricle Normal LV chamber size. Normal LV wall thickness. Mildly reduced overall LV systolic function. Estimated LVEF 45% Inferior, posterior and inferoseptal hypokinesis. Abnormal LV diastolic function, Grade 2, with echo evidence of elevated LA pressure.  Overview:  MUGA 20% 09/03/16 Overview:  Echo 08/03/15: Left Ventricle Normal LV chamber size. Normal LV wall   . Chronic systolic heart failure ([place]) 06/07/2015  . CKD (chronic kidney disease) stage 3, GFR 30-59 ml/min (HCC) 06/07/2015  . Coronary artery disease involving native heart with angina pectoris ([place]) 06/29/2017   Overview:  Added automatically from request for surgery [id number]  . 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 Essential hypertension 06/29/2017  . Hyperlipidemia 03/07/2015  . 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 Ischemic cardiomyopathy 11/18/2015   Overview:  EF 20-25%  . NSTEMI (non-ST elevated myocardial infarction) ([place]) 05/25/2015   Overview:  Cardiac cath 05/28/15 :Conclusions 1. Pt was turned down by CTS due to co-morbidities. Pt is for PCI to multiple lesions on LAD and RCA. Ramus is small, will treat medically. 2. Successful PCI / Xience Drug Eluting Stent of the mid and distal Left Anterior Descending Coronary Artery. 3. Successful PCI / Xience  Drug Eluting Stent of the ostial and mid Right Coronary Artery. 4. POBA to Di  . PNA (pneumonia) 08/08/2015  . Pneumonia due to infectious organism 08/19/2015   Overview:  Discharge summary 08/11/15: Her chest x-ray showed airspace opacities concerning for multifocal pneumonia and she will be discharged on oral Levaquin. Follow-up chest x-ray is recommended to ensure resolution.   . Shortness of breath 09/24/2017  . ST elevation myocardial infarction involving right coronary artery ([place]) 08/03/2015   Overview:  Due to ISR and acute stent thrombosis Cath 08/03/15: Diagnostic procedure: Left Heart Cath PCI procedure: Stent DES, Thrombectomy Complications: No Complications. Conclusions Diagnostic Summary Acute occlusion of RCA ostial stent thrombosis Severe stenosis of the mid RCA, partially ISR Patent LAD stent CTO of small circ system Interventional Summary Successful Thrombectomy/PTCA of the o  . STEMI (ST elevation myocardial infarction) ([place]) 08/03/2015  . Type 2 diabetes mellitus, without long-term current use of insulin ([place]) 03/07/2015  . Vitamin B 12 deficiency 09/24/2017  . Vitamin D deficiency 09/24/2017    Past Surgical History:  Procedure Laterality Date  . C-spine surgery    . CATARACT EXTRACTION, BILATERAL    . CHOLECYSTECTOMY    . CORONARY ANGIOPLASTY WITH STENT PLACEMENT    . INSERT / REPLACE / [place]     ICD, Medtronic  . LITHOTRIPSY    . URETEROSCOPY      Current Medications: No outpatient medications have been marked as taking for the 03/07/19 encounter (Appointment) with [name], MD.     Allergies:   Codeine and Penicillins   Social History   Socioeconomic History  . Marital status: Widowed    Spouse name: Not on file  . Number of children: Not on file  . Years of education: Not on file  . Highest education level: Not on file  Occupational History  . Not on file  Social Needs  . Financial resource strain: Not on file  . Food insecurity    Worry: Not on file    Inability: Not on file  . Transportation needs    Medical: Not on file    Non-medical: Not on file  Tobacco Use  . Smoking status: Former Smoker    Quit date: 06/07/1985    Years since quitting: 33.7  . Smokeless tobacco: Never Used  Substance and Sexual Activity  . Alcohol use: No    Frequency: Never  . Drug use: No  . Sexual activity: Not on file   Lifestyle  . Physical activity    Days per week: Not on file    Minutes per session: Not on file  . Stress: Not on file  Relationships  . Social Herbalist on phone: Not on file    Gets together: Not on file    Attends religious service: Not on file    Active member of club or organization: Not on file    Attends meetings of clubs or organizations: Not on file    Relationship status: Not on file  Other Topics Concern  . Not on file  Social History Narrative  . Not on file     Family History: The patient's ***family history includes CAD in her brother and brother; Cancer in her father and sister; Diabetes in her mother; Hypertension in her mother; Stroke in her mother. ROS:   Please see the history of present illness.    All other systems reviewed and are negative.  EKGs/Labs/Other Studies Reviewed:    The  following studies were reviewed today:  EKG:  EKG ordered today and personally reviewed.  The ekg ordered today demonstrates ***  01/13/19 ICD in Clinic Device Check: Per Satira Mccallum [place]:  ICD check in clinic. Normal device function. Thresholds and sensing consistent with previous device measurements. Impedance trends stable over time. Pt seen for inappropriate therapy for ST/AT ~150 bpm with 3 ATP and 2 Shock. BB increased. Reviewed episodes shows APPROPRIATE ATP in 04/14/18 with rates between 170-200. VT monitor zone increased to 171 from 150, Monitor ONLY. VT 2 therapy zone remains programmed at 200 bpm. Detection decreased to 42 beats (lowest detection allowed). Histogram distribution appropriate for patient and level of activity. Device programmed at appropriate safety margins. Remaining battery capacity 100%. Pt enrolled in remote follow-up. Sees Dr. Bettina Gavia 01/17/2019 and on Call back for Dr. Curt Bears 06/2019. Reviewed shock plan.  Recent Labs: 01/21/2019: ALT 11; BUN 28; Creatinine, Ser 1.45; Hemoglobin 13.1; Platelets 244; Potassium 3.4; Sodium 142; TSH 2.030   Recent Lipid Panel No results found for: CHOL, TRIG, HDL, CHOLHDL, VLDL, LDLCALC, LDLDIRECT  Physical Exam:    VS:  There were no vitals taken for this visit.    Wt Readings from Last 3 Encounters:  01/17/19 192 lb 12.8 oz (87.5 kg)  10/13/18 195 lb (88.5 kg)     GEN: *** Well nourished, well developed in no acute distress HEENT: Normal NECK: No JVD; No carotid bruits LYMPHATICS: No lymphadenopathy CARDIAC: ***RRR, no murmurs, rubs, gallops RESPIRATORY:  Clear to auscultation without rales, wheezing or rhonchi  ABDOMEN: Soft, non-tender, non-distended MUSCULOSKELETAL:  No edema; No deformity  SKIN: Warm and dry NEUROLOGIC:  Alert and oriented x 3 PSYCHIATRIC:  Normal affect    Signed, Shirlee More, MD  03/06/2019 5:56 PM    [facility]

## 2019-03-07 ENCOUNTER — Ambulatory Visit: Payer: Medicare Other | Admitting: Cardiology

## 2019-03-14 ENCOUNTER — Ambulatory Visit (INDEPENDENT_AMBULATORY_CARE_PROVIDER_SITE_OTHER): Payer: Medicare Other | Admitting: *Deleted

## 2019-03-14 DIAGNOSIS — I255 Ischemic cardiomyopathy: Secondary | ICD-10-CM | POA: Diagnosis not present

## 2019-03-16 LAB — CUP PACEART REMOTE DEVICE CHECK
Date Time Interrogation Session: 20200805135725
Implantable Lead Implant Date: 20180412
Implantable Lead Implant Date: 20180412
Implantable Lead Location: 753859
Implantable Lead Location: 753860
Implantable Lead Model: 377
Implantable Lead Model: 402266
Implantable Lead Serial Number: 4970639
Implantable Lead Serial Number: 49730543
Implantable Pulse Generator Implant Date: 20180412
Pulse Gen Model: 404623
Pulse Gen Serial Number: 60967865

## 2019-03-23 ENCOUNTER — Encounter: Payer: Self-pay | Admitting: Cardiology

## 2019-03-23 NOTE — Progress Notes (Signed)
Remote ICD transmission.   

## 2019-03-31 ENCOUNTER — Encounter (HOSPITAL_COMMUNITY): Payer: Self-pay | Admitting: Interventional Cardiology

## 2019-03-31 ENCOUNTER — Encounter (HOSPITAL_COMMUNITY): Admission: AD | Disposition: A | Discharge status: Home Health | Payer: Self-pay | Attending: Cardiology

## 2019-03-31 ENCOUNTER — Inpatient Hospital Stay (HOSPITAL_COMMUNITY)
Admission: AD | Admit: 2019-03-31 | Discharge: 2019-04-05 | DRG: 281 | Disposition: A | Discharge status: Home Health | Payer: Medicare Other | Source: Other Acute Inpatient Hospital | Attending: Cardiology | Admitting: Cardiology

## 2019-03-31 DIAGNOSIS — E1122 Type 2 diabetes mellitus with diabetic chronic kidney disease: Secondary | ICD-10-CM

## 2019-03-31 DIAGNOSIS — I2510 Atherosclerotic heart disease of native coronary artery without angina pectoris: Secondary | ICD-10-CM | POA: Diagnosis not present

## 2019-03-31 DIAGNOSIS — Z79899 Other long term (current) drug therapy: Secondary | ICD-10-CM | POA: Diagnosis not present

## 2019-03-31 DIAGNOSIS — N183 Chronic kidney disease, stage 3 unspecified: Secondary | ICD-10-CM | POA: Diagnosis present

## 2019-03-31 DIAGNOSIS — Z20828 Contact with and (suspected) exposure to other viral communicable diseases: Secondary | ICD-10-CM | POA: Diagnosis present

## 2019-03-31 DIAGNOSIS — I252 Old myocardial infarction: Secondary | ICD-10-CM

## 2019-03-31 DIAGNOSIS — Z23 Encounter for immunization: Secondary | ICD-10-CM

## 2019-03-31 DIAGNOSIS — R079 Chest pain, unspecified: Secondary | ICD-10-CM | POA: Diagnosis present

## 2019-03-31 DIAGNOSIS — Z8674 Personal history of sudden cardiac arrest: Secondary | ICD-10-CM | POA: Diagnosis not present

## 2019-03-31 DIAGNOSIS — I130 Hypertensive heart and chronic kidney disease with heart failure and stage 1 through stage 4 chronic kidney disease, or unspecified chronic kidney disease: Secondary | ICD-10-CM | POA: Diagnosis not present

## 2019-03-31 DIAGNOSIS — I255 Ischemic cardiomyopathy: Secondary | ICD-10-CM | POA: Diagnosis not present

## 2019-03-31 DIAGNOSIS — Z885 Allergy status to narcotic agent status: Secondary | ICD-10-CM | POA: Diagnosis not present

## 2019-03-31 DIAGNOSIS — Z7982 Long term (current) use of aspirin: Secondary | ICD-10-CM

## 2019-03-31 DIAGNOSIS — Z8249 Family history of ischemic heart disease and other diseases of the circulatory system: Secondary | ICD-10-CM

## 2019-03-31 DIAGNOSIS — E785 Hyperlipidemia, unspecified: Secondary | ICD-10-CM | POA: Diagnosis present

## 2019-03-31 DIAGNOSIS — Z880 Allergy status to penicillin: Secondary | ICD-10-CM

## 2019-03-31 DIAGNOSIS — I25110 Atherosclerotic heart disease of native coronary artery with unstable angina pectoris: Secondary | ICD-10-CM | POA: Diagnosis not present

## 2019-03-31 DIAGNOSIS — Z9049 Acquired absence of other specified parts of digestive tract: Secondary | ICD-10-CM

## 2019-03-31 DIAGNOSIS — I5042 Chronic combined systolic (congestive) and diastolic (congestive) heart failure: Secondary | ICD-10-CM | POA: Diagnosis not present

## 2019-03-31 DIAGNOSIS — Z95810 Presence of automatic (implantable) cardiac defibrillator: Secondary | ICD-10-CM | POA: Diagnosis present

## 2019-03-31 DIAGNOSIS — Z833 Family history of diabetes mellitus: Secondary | ICD-10-CM

## 2019-03-31 DIAGNOSIS — I214 Non-ST elevation (NSTEMI) myocardial infarction: Secondary | ICD-10-CM | POA: Diagnosis not present

## 2019-03-31 DIAGNOSIS — I447 Left bundle-branch block, unspecified: Secondary | ICD-10-CM | POA: Diagnosis present

## 2019-03-31 DIAGNOSIS — Z951 Presence of aortocoronary bypass graft: Secondary | ICD-10-CM

## 2019-03-31 DIAGNOSIS — I352 Nonrheumatic aortic (valve) stenosis with insufficiency: Secondary | ICD-10-CM | POA: Diagnosis not present

## 2019-03-31 DIAGNOSIS — Z823 Family history of stroke: Secondary | ICD-10-CM

## 2019-03-31 DIAGNOSIS — E119 Type 2 diabetes mellitus without complications: Secondary | ICD-10-CM

## 2019-03-31 DIAGNOSIS — N179 Acute kidney failure, unspecified: Secondary | ICD-10-CM | POA: Diagnosis not present

## 2019-03-31 DIAGNOSIS — Z87891 Personal history of nicotine dependence: Secondary | ICD-10-CM

## 2019-03-31 DIAGNOSIS — I471 Supraventricular tachycardia: Secondary | ICD-10-CM | POA: Diagnosis not present

## 2019-03-31 DIAGNOSIS — I200 Unstable angina: Secondary | ICD-10-CM | POA: Diagnosis present

## 2019-03-31 DIAGNOSIS — I351 Nonrheumatic aortic (valve) insufficiency: Secondary | ICD-10-CM | POA: Diagnosis not present

## 2019-03-31 DIAGNOSIS — L8990 Pressure ulcer of unspecified site, unspecified stage: Secondary | ICD-10-CM | POA: Insufficient documentation

## 2019-03-31 DIAGNOSIS — I340 Nonrheumatic mitral (valve) insufficiency: Secondary | ICD-10-CM | POA: Diagnosis not present

## 2019-03-31 HISTORY — PX: LEFT HEART CATH AND CORONARY ANGIOGRAPHY: CATH118249

## 2019-03-31 LAB — TROPONIN I (HIGH SENSITIVITY): Troponin I (High Sensitivity): 330 ng/L (ref <18)

## 2019-03-31 LAB — CBC
HCT: 37.6 % (ref 36.0–46.0)
Hemoglobin: 12.1 g/dL (ref 12.0–15.0)
MCH: 29.7 pg (ref 26.0–34.0)
MCHC: 32.2 g/dL (ref 30.0–36.0)
MCV: 92.2 fL (ref 80.0–100.0)
Platelets: 176 10*3/uL (ref 150–400)
RBC: 4.08 MIL/uL (ref 3.87–5.11)
RDW: 12.5 % (ref 11.5–15.5)
WBC: 7 10*3/uL (ref 4.0–10.5)
nRBC: 0 % (ref 0.0–0.2)

## 2019-03-31 LAB — LIPID PANEL
Cholesterol: 125 mg/dL (ref 0–200)
HDL: 42 mg/dL (ref >40)
LDL Cholesterol: 70 mg/dL (ref 0–99)
Total CHOL/HDL Ratio: 3 RATIO
Triglycerides: 66 mg/dL (ref <150)
VLDL: 13 mg/dL (ref 0–40)

## 2019-03-31 LAB — HEMOGLOBIN A1C
Hgb A1c MFr Bld: 8.6 % — ABNORMAL HIGH (ref 4.8–5.6)
Mean Plasma Glucose: 200.12 mg/dL

## 2019-03-31 LAB — GLUCOSE, CAPILLARY
Glucose-Capillary: 153 mg/dL — ABNORMAL HIGH (ref 70–99)
Glucose-Capillary: 171 mg/dL — ABNORMAL HIGH (ref 70–99)
Glucose-Capillary: 229 mg/dL — ABNORMAL HIGH (ref 70–99)
Glucose-Capillary: 109 mg/dL — ABNORMAL HIGH (ref 70–99)

## 2019-03-31 LAB — BASIC METABOLIC PANEL
Anion gap: 10 (ref 5–15)
BUN: 19 mg/dL (ref 8–23)
CO2: 23 mmol/L (ref 22–32)
Calcium: 8.6 mg/dL — ABNORMAL LOW (ref 8.9–10.3)
Chloride: 107 mmol/L (ref 98–111)
Creatinine, Ser: 1.29 mg/dL — ABNORMAL HIGH (ref 0.44–1.00)
GFR calc Af Amer: 47 mL/min — ABNORMAL LOW (ref >60)
GFR calc non Af Amer: 40 mL/min — ABNORMAL LOW (ref >60)
Glucose, Bld: 194 mg/dL — ABNORMAL HIGH (ref 70–99)
Potassium: 3.8 mmol/L (ref 3.5–5.1)
Sodium: 140 mmol/L (ref 135–145)

## 2019-03-31 LAB — POCT ACTIVATED CLOTTING TIME: Activated Clotting Time: 114 seconds

## 2019-03-31 LAB — MRSA PCR SCREENING: MRSA by PCR: NEGATIVE

## 2019-03-31 LAB — HEPARIN LEVEL (UNFRACTIONATED): Heparin Unfractionated: 0.36 IU/mL (ref 0.30–0.70)

## 2019-03-31 LAB — SARS CORONAVIRUS 2 BY RT PCR (HOSPITAL ORDER, PERFORMED IN ~~LOC~~ HOSPITAL LAB): SARS Coronavirus 2: NEGATIVE

## 2019-03-31 SURGERY — LEFT HEART CATH AND CORONARY ANGIOGRAPHY
Anesthesia: LOCAL

## 2019-03-31 MED ORDER — ASPIRIN EC 81 MG PO TBEC
81.0000 mg | DELAYED_RELEASE_TABLET | Freq: Every day | ORAL | Status: DC
  Administered 2019-04-01 – 2019-04-03 (×3): 81 mg via ORAL
  Filled 2019-03-31 (×3): qty 1

## 2019-03-31 MED ORDER — SODIUM CHLORIDE 0.9 % IV SOLN: 250.0000 mL | INTRAVENOUS | Status: DC | PRN

## 2019-03-31 MED ORDER — SODIUM CHLORIDE 0.9 % WEIGHT BASED INFUSION
1.0000 mL/kg/h | INTRAVENOUS | Status: AC
  Administered 2019-03-31 (×2): 1 mL/kg/h via INTRAVENOUS

## 2019-03-31 MED ORDER — HEPARIN (PORCINE) 25000 UT/250ML-% IV SOLN
1050.0000 [IU]/h | INTRAVENOUS | Status: DC
  Administered 2019-03-31: 20:00:00 750 [IU]/h via INTRAVENOUS
  Administered 2019-04-01: 900 [IU]/h via INTRAVENOUS
  Filled 2019-03-31 (×5): qty 250

## 2019-03-31 MED ORDER — MIDAZOLAM HCL 2 MG/2ML IJ SOLN
INTRAMUSCULAR | Status: DC | PRN
  Administered 2019-03-31: 0.5 mg via INTRAVENOUS

## 2019-03-31 MED ORDER — ASPIRIN 81 MG PO CHEW: 81.0000 mg | CHEWABLE_TABLET | Freq: Every day | ORAL | Status: DC

## 2019-03-31 MED ORDER — HYDRALAZINE HCL 20 MG/ML IJ SOLN: 10.0000 mg | INTRAMUSCULAR | Status: AC | PRN

## 2019-03-31 MED ORDER — INSULIN ASPART 100 UNIT/ML ~~LOC~~ SOLN
0.0000 [IU] | Freq: Three times a day (TID) | SUBCUTANEOUS | Status: DC
  Administered 2019-03-31: 18:00:00 5 [IU] via SUBCUTANEOUS
  Administered 2019-04-01 (×2): 3 [IU] via SUBCUTANEOUS
  Administered 2019-04-01 – 2019-04-02 (×2): 2 [IU] via SUBCUTANEOUS
  Administered 2019-04-02 (×2): 5 [IU] via SUBCUTANEOUS
  Administered 2019-04-03 – 2019-04-04 (×6): 3 [IU] via SUBCUTANEOUS
  Administered 2019-04-05: 5 [IU] via SUBCUTANEOUS
  Administered 2019-04-05: 12:00:00 2 [IU] via SUBCUTANEOUS

## 2019-03-31 MED ORDER — IOHEXOL 350 MG/ML SOLN
INTRAVENOUS | Status: DC | PRN
  Administered 2019-03-31: 13:00:00 75 mL via INTRA_ARTERIAL

## 2019-03-31 MED ORDER — NITROGLYCERIN 0.4 MG SL SUBL: 0.4000 mg | SUBLINGUAL_TABLET | SUBLINGUAL | Status: DC | PRN

## 2019-03-31 MED ORDER — LIDOCAINE HCL (PF) 1 % IJ SOLN
INTRAMUSCULAR | Status: AC
  Filled 2019-03-31: qty 30

## 2019-03-31 MED ORDER — PNEUMOCOCCAL VAC POLYVALENT 25 MCG/0.5ML IJ INJ
0.5000 mL | INJECTION | INTRAMUSCULAR | Status: AC
  Administered 2019-04-05: 0.5 mL via INTRAMUSCULAR

## 2019-03-31 MED ORDER — SODIUM CHLORIDE 0.9% FLUSH: 3.0000 mL | INTRAVENOUS | Status: DC | PRN

## 2019-03-31 MED ORDER — SODIUM CHLORIDE 0.9 % WEIGHT BASED INFUSION
3.0000 mL/kg/h | INTRAVENOUS | Status: AC
  Administered 2019-03-31: 09:00:00 3 mL/kg/h via INTRAVENOUS

## 2019-03-31 MED ORDER — MIDAZOLAM HCL 2 MG/2ML IJ SOLN
INTRAMUSCULAR | Status: AC
  Filled 2019-03-31: qty 2

## 2019-03-31 MED ORDER — LIDOCAINE HCL (PF) 1 % IJ SOLN
INTRAMUSCULAR | Status: DC | PRN
  Administered 2019-03-31: 2 mL
  Administered 2019-03-31: 15 mL

## 2019-03-31 MED ORDER — FENTANYL CITRATE (PF) 100 MCG/2ML IJ SOLN
INTRAMUSCULAR | Status: AC
  Filled 2019-03-31: qty 2

## 2019-03-31 MED ORDER — FENTANYL CITRATE (PF) 100 MCG/2ML IJ SOLN
INTRAMUSCULAR | Status: DC | PRN
  Administered 2019-03-31: 25 ug via INTRAVENOUS

## 2019-03-31 MED ORDER — SODIUM CHLORIDE 0.9% FLUSH
3.0000 mL | Freq: Two times a day (BID) | INTRAVENOUS | Status: DC
  Administered 2019-04-01 – 2019-04-05 (×7): 3 mL via INTRAVENOUS

## 2019-03-31 MED ORDER — LABETALOL HCL 5 MG/ML IV SOLN: 10.0000 mg | INTRAVENOUS | Status: AC | PRN

## 2019-03-31 MED ORDER — ATORVASTATIN CALCIUM 80 MG PO TABS
80.0000 mg | ORAL_TABLET | Freq: Every day | ORAL | Status: DC
  Administered 2019-03-31 – 2019-04-05 (×6): 80 mg via ORAL
  Filled 2019-03-31 (×6): qty 1

## 2019-03-31 MED ORDER — ASPIRIN EC 81 MG PO TBEC: 81.0000 mg | DELAYED_RELEASE_TABLET | Freq: Every day | ORAL | Status: DC

## 2019-03-31 MED ORDER — HEPARIN (PORCINE) IN NACL 1000-0.9 UT/500ML-% IV SOLN
INTRAVENOUS | Status: AC
  Filled 2019-03-31: qty 1000

## 2019-03-31 MED ORDER — ONDANSETRON HCL 4 MG/2ML IJ SOLN: 4.0000 mg | Freq: Four times a day (QID) | INTRAMUSCULAR | Status: DC | PRN

## 2019-03-31 MED ORDER — SODIUM CHLORIDE 0.9 % WEIGHT BASED INFUSION
1.0000 mL/kg/h | INTRAVENOUS | Status: DC
  Administered 2019-03-31: 11:00:00 1 mL/kg/h via INTRAVENOUS

## 2019-03-31 MED ORDER — HEPARIN (PORCINE) IN NACL 1000-0.9 UT/500ML-% IV SOLN
INTRAVENOUS | Status: DC | PRN
  Administered 2019-03-31 (×2): 500 mL

## 2019-03-31 MED ORDER — ASPIRIN 81 MG PO CHEW
81.0000 mg | CHEWABLE_TABLET | ORAL | Status: AC
  Administered 2019-03-31: 81 mg via ORAL
  Filled 2019-03-31: qty 1

## 2019-03-31 MED ORDER — ACETAMINOPHEN 325 MG PO TABS: 650.0000 mg | ORAL_TABLET | ORAL | Status: DC | PRN

## 2019-03-31 MED ORDER — HEPARIN (PORCINE) 25000 UT/250ML-% IV SOLN
750.0000 [IU]/h | INTRAVENOUS | Status: DC
  Administered 2019-03-31: 750 [IU]/h via INTRAVENOUS

## 2019-03-31 MED ORDER — SODIUM CHLORIDE 0.9% FLUSH: 3.0000 mL | Freq: Two times a day (BID) | INTRAVENOUS | Status: DC

## 2019-03-31 MED ORDER — METOPROLOL TARTRATE 50 MG PO TABS
50.0000 mg | ORAL_TABLET | Freq: Three times a day (TID) | ORAL | Status: DC
  Administered 2019-03-31 – 2019-04-05 (×17): 50 mg via ORAL
  Filled 2019-03-31 (×17): qty 1

## 2019-03-31 MED ORDER — ATORVASTATIN CALCIUM 10 MG PO TABS: 20.0000 mg | ORAL_TABLET | Freq: Every day | ORAL | Status: DC

## 2019-03-31 SURGICAL SUPPLY — 12 items
CATH INFINITI 5FR MPB2 (CATHETERS) ×1 IMPLANT
CATH INFINITI 5FR MULTPACK ANG (CATHETERS) ×1 IMPLANT
CLOSURE MYNX CONTROL 5F (Vascular Products) ×1 IMPLANT
GLIDESHEATH SLEND A-KIT 6F 22G (SHEATH) ×1 IMPLANT
GUIDEWIRE INQWIRE 1.5J.035X260 (WIRE) IMPLANT
INQWIRE 1.5J .035X260CM (WIRE)
KIT HEART LEFT (KITS) ×2 IMPLANT
PACK CARDIAC CATHETERIZATION (CUSTOM PROCEDURE TRAY) ×2 IMPLANT
SHEATH PINNACLE 5F 10CM (SHEATH) ×1 IMPLANT
TRANSDUCER W/STOPCOCK (MISCELLANEOUS) ×2 IMPLANT
TUBING CIL FLEX 10 FLL-RA (TUBING) ×2 IMPLANT
WIRE EMERALD 3MM-J .035X150CM (WIRE) ×1 IMPLANT

## 2019-03-31 NOTE — Interval H&P Note (Signed)
Cath Lab Visit (complete for each Cath Lab visit)  Clinical Evaluation Leading to the Procedure:   ACS: Yes.    Non-ACS:    Anginal Classification: CCS III  Anti-ischemic medical therapy: Minimal Therapy (1 class of medications)  Non-Invasive Test Results: No non-invasive testing performed  Prior CABG: No previous CABG      History and Physical Interval Note:  03/31/2019 11:50 AM  Lori Rivera  has presented today for surgery, with the diagnosis of nonstemi.  The various methods of treatment have been discussed with the patient and family. After consideration of risks, benefits and other options for treatment, the patient has consented to  Procedure(s): LEFT HEART CATH AND CORONARY ANGIOGRAPHY (N/A) as a surgical intervention.  The patient's history has been reviewed, patient examined, no change in status, stable for surgery.  I have reviewed the patient's chart and labs.  Questions were answered to the patient's satisfaction.     Belva Crome III

## 2019-03-31 NOTE — Progress Notes (Signed)
TCTS consulted for CABG evaluation. °

## 2019-03-31 NOTE — Progress Notes (Signed)
Pt left for cath lab.

## 2019-03-31 NOTE — Progress Notes (Signed)
ANTICOAGULATION CONSULT NOTE - Initial Consult  Pharmacy Consult for IV heparin Indication: chest pain/ACS  Allergies  Allergen Reactions  . Codeine Other (See Comments)    confused  . Penicillins Rash    Patient Measurements: Height: 5\' 9"  (175.3 cm) Weight: 206 lb 12.7 oz (93.8 kg) IBW/kg (Calculated) : 66.2 Heparin Dosing Weight: 86.1 kg  Vital Signs: BP: 139/57 (08/20 0500) Pulse Rate: 86 (08/20 0500)  Labs: Recent Labs    03/31/19 0750  CREATININE 1.29*  TROPONINIHS 330*    Estimated Creatinine Clearance: 45.9 mL/min (A) (by C-G formula based on SCr of 1.29 mg/dL (H)).   Medical History: Past Medical History:  Diagnosis Date  . Aortic regurgitation 09/24/2017  . Arthritis 09/24/2017  . CAD in native artery 03/07/2015   Overview:  1. Out of hospital cardiac arrest Aug 2016 with MI, shock, PCI and 2 Xience DES to mid and distal culprit RCA with diffuse LAD disease and hypothermia protocol 2. NonSTEMI,05/27/15  Conclusions: 1. Pt was turned down by CTS due to co-morbidities. Pt is for PCI to multiple lesions on LAD and RCA. Ramus is small, will treat medically. 2. Successful PCI / Xience Drug Eluting Stent of the m  . Chest pain 08/08/2015  . CHF (congestive heart failure) ([place]) 09/24/2017  . Chronic combined systolic and diastolic heart failure ([place]) 08/19/2015   Overview:  Echo 08/03/15: Left Ventricle Normal LV chamber size. Normal LV wall thickness. Mildly reduced overall LV systolic function. Estimated LVEF 45% Inferior, posterior and inferoseptal hypokinesis. Abnormal LV diastolic function, Grade 2, with echo evidence of elevated LA pressure.  Overview:  MUGA 20% 09/03/16 Overview:  Echo 08/03/15: Left Ventricle Normal LV chamber size. Normal LV wall   . Chronic systolic heart failure ([place]) 06/07/2015  . CKD (chronic kidney disease) stage 3, GFR 30-59 ml/min (HCC) 06/07/2015  . Coronary artery disease involving native heart with angina pectoris ([place]) 06/29/2017   Overview:  Added automatically from request for surgery [id number]  . 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 Essential hypertension 06/29/2017  . Hyperlipidemia 03/07/2015  . 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 Ischemic cardiomyopathy 11/18/2015   Overview:  EF 20-25%  . NSTEMI (non-ST elevated myocardial infarction) ([place]) 05/25/2015   Overview:  Cardiac cath 05/28/15 :Conclusions 1. Pt was turned down by CTS due to co-morbidities. Pt is for PCI to multiple lesions on LAD and RCA. Ramus is small, will treat medically. 2. Successful PCI / Xience Drug Eluting Stent of the mid and distal Left Anterior Descending Coronary Artery. 3. Successful PCI / Xience Drug Eluting Stent of the ostial and mid Right Coronary Artery. 4. POBA to Di  . PNA (pneumonia) 08/08/2015  . Pneumonia due to infectious organism 08/19/2015   Overview:  Discharge summary 08/11/15: Her chest x-ray showed airspace opacities concerning for multifocal pneumonia and she will be discharged on oral Levaquin. Follow-up chest x-ray is recommended to ensure resolution.  . Shortness of breath 09/24/2017  . ST elevation myocardial infarction involving right coronary artery ([place]) 08/03/2015   Overview:  Due to ISR and acute stent thrombosis Cath 08/03/15: Diagnostic procedure: Left Heart Cath PCI procedure: Stent DES, Thrombectomy Complications: No Complications. Conclusions Diagnostic Summary Acute occlusion of RCA ostial stent thrombosis Severe stenosis of the mid RCA, partially ISR Patent LAD stent CTO of small circ system Interventional Summary Successful Thrombectomy/PTCA of the o  . STEMI (ST elevation myocardial infarction) ([place])  08/03/2015  . Type 2 diabetes mellitus, without long-term current use of insulin ([place]) 03/07/2015  . Vitamin B 12 deficiency 09/24/2017  . Vitamin D deficiency 09/24/2017    Medications:  Infusions:  .  sodium chloride    . heparin      Assessment: 73 yo female transferred [facility] for cardiac cath.  IV heparin running at 750 units/hr at time of transfer.  No overt bleeding or complications noted.  From [facility] 8/19: Hgb 13, Pltc 220, Scr 1.3.  Goal of Therapy:  Heparin level 0.3-0.7 units/ml Monitor platelets by anticoagulation protocol: Yes   Plan:  1. Continue IV heparin at current rate of 750 units/hr. 2. Check heparin level now. 3. Daily heparin level and CBC. 4. F/u plans for cath lab.  Marguerite Olea, [facility] Clinical Pharmacist Phone [phone]  03/31/2019 9:29 AM

## 2019-03-31 NOTE — H&P (Addendum)
Cardiology Admission History and Physical:   Patient ID: Lori Rivera MRN: [id number]; DOB: [birth year]   Admission date: 03/31/2019  Primary Care Provider: Garwin Brothers, MD Primary Cardiologist: Lori More, MD  Primary Electrophysiologist:  Lori Meredith Leeds, MD   Chief Complaint:  Chest pain, elevated troponin  Patient Profile:   Lori Rivera is a 73 y.o. female with a history of CAD s/p multiple PCIs, ischemic cardiomyopathy with ICD in place (Biotronik 2094), chronic systolic heart failure EF 40-45%, HTN, HLD, DM, palpitations with ST/AT on device, and family history of heart disease.  History of Present Illness:   Lori Rivera has a history of CAD with cardiac arrest in 2016 90% occluded proximal RCA treated with stent.  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 She follows with Dr. Bettina Rivera. Last heart cath 02/23/18 with unsuccessful PCI of proximal ramus artery due to non-dilatable lesion. 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  Medical therapy was recommended as she was felt to not be a good candidate for CABG (turned down by TCS).  She has ischemic cardiomyopathy with biotronik ICD in place. She was recently seen by EP Lori Rivera [facility]) on 01/13/19 for inappropriate therapy with shocks. Determined she had appropriate therapy for VT 170-200s. BB ws increased.  Echo 04/05/14 with EF of 40-45%, mild aortic regurgitation, and mild to moderate MR.  She presented to [facility] with chest pain after taking 4 SL nitro tablets. EMS provided a 5th nitro and zofran in route with resolution of her CP. At [facility], troponin increased to 0.56 and she was placed on heparin. Decision was made to transfer to [facility] for cardiac catheterization. Negative COVID test at [facility]. Repeat here pending.   On arrival, she is on heparin drip and chest pain free. She states her chest pain feels like it did in 2015/2016.  It is described as a pressure in the center of her chest that started at approximately midnight last evening. She was resting, not asleep, at the time. She took nitro tablets with relief that only lasted for approximately 5-15 min. She took a total of 4 nitro SL tablets and then called EMS when the chest pain returned. She is now chest pain free. She denies N/V, SOB, orthopnea, but does report lower extremity swelling.    Heart Pathway Score:     Past Medical History:  Diagnosis Date   Aortic regurgitation 09/24/2017   Arthritis 09/24/2017   CAD in native artery 03/07/2015   Overview:  1. Out of hospital cardiac arrest Aug 2016 with MI, shock, PCI and 2 Xience DES to mid and distal culprit RCA with diffuse LAD disease and hypothermia protocol 2. NonSTEMI,05/27/15  Conclusions: 1. Pt was turned down by CTS due to co-morbidities. Pt is for PCI to multiple lesions on LAD and RCA. Ramus is small, Lori treat medically. 2. Successful PCI / Xience Drug Eluting Stent of the m   Chest pain 08/08/2015   CHF (congestive heart failure) ([place]) 09/24/2017   Chronic combined systolic and diastolic heart failure ([place]) 08/19/2015   Overview:  Echo 08/03/15: Left Ventricle Normal LV chamber size. Normal LV wall thickness. Mildly reduced overall LV systolic function. Estimated LVEF 45% Inferior, posterior and inferoseptal hypokinesis. Abnormal LV diastolic function, Grade 2, with echo evidence of elevated LA pressure.  Overview:  MUGA 20% 09/03/16 Overview:  Echo 08/03/15: Left Ventricle Normal LV chamber size. 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 Pt was turned down by CTS due to co-morbidities. Pt is for PCI to multiple lesions on LAD and RCA. Ramus is small, Lori treat medically. 2. Successful PCI / Xience Drug Eluting Stent of the mid and distal Left Anterior Descending Coronary Artery. 3. Successful PCI / Xience Drug Eluting Stent of the ostial and mid Right Coronary Artery. 4. POBA to Di   PNA (pneumonia) 08/08/2015   Pneumonia due to infectious organism 08/19/2015   Overview:  Discharge summary 08/11/15: Her chest x-ray showed airspace opacities concerning for multifocal pneumonia and she Lori be discharged on oral Levaquin. Follow-up chest x-ray is recommended to ensure resolution.   Shortness of breath 09/24/2017   ST elevation myocardial infarction involving right coronary artery ([place]) 08/03/2015   Overview:  Due to ISR and acute stent thrombosis Cath 08/03/15: Diagnostic procedure: Left Heart Cath PCI procedure: Stent DES, Thrombectomy Complications: No Complications. Conclusions Diagnostic Summary Acute occlusion of RCA ostial stent thrombosis Severe stenosis of the mid RCA, partially ISR Patent LAD stent CTO of small circ system Interventional Summary Successful Thrombectomy/PTCA of the o   STEMI (ST elevation myocardial infarction) ([place]) 08/03/2015   Type 2 diabetes mellitus, without long-term current use of insulin ([place]) 03/07/2015   Vitamin B 12 deficiency 09/24/2017   Vitamin D deficiency 09/24/2017    Past Surgical History:  Procedure Laterality Date   C-spine surgery     CATARACT EXTRACTION, BILATERAL     CHOLECYSTECTOMY     CORONARY ANGIOPLASTY WITH STENT PLACEMENT     INSERT / REPLACE / REMOVE  PACEMAKER     ICD, Medtronic   LITHOTRIPSY     URETEROSCOPY       Medications Prior to Admission: Prior to Admission medications   Medication Sig Start Date End Date Taking? Authorizing Provider  aspirin EC 81 MG tablet Take 81 mg by mouth daily.     [provider]  atorvastatin (LIPITOR) 20 MG tablet Take 20 mg by mouth daily at 6 PM.  04/14/14   [provider]  Dulaglutide (TRULICITY) 7.06 CB/7.6EG SOPN Inject 1 pen into the skin once a week.     [provider]  furosemide (LASIX) 40 MG tablet Take 60 mg by mouth daily.  04/14/14   [provider]  metoprolol tartrate (LOPRESSOR) 50 MG tablet Take 1 tablet (50 mg total) by mouth 3 (three) times daily. 01/17/19   [name], MD  nitroGLYCERIN (NITROSTAT) 0.4 MG SL tablet Place 0.4 mg under the tongue every 5 (five) minutes as needed.  06/11/15 10/12/21  [provider]  potassium chloride (MICRO-K) 10 MEQ CR capsule Take 10 mEq by mouth daily.    [provider]  TRESIBA FLEXTOUCH 100 UNIT/ML SOPN FlexTouch Pen 10 Units daily. 01/16/19   [provider]     Allergies:    Allergies  Allergen  Reactions   Codeine Other (See Comments)    confused   Penicillins Rash    Social History:   Social History   Socioeconomic History   Marital status: Widowed    Spouse name: Not on file   Number of children: Not on file   Years of education: Not on file   Highest education level: Not on file  Occupational History   Not on file  Social Needs   Financial resource strain: Not on file   Food insecurity    Worry: Not on file    Inability: Not on file   Transportation needs    Medical: Not on file    Non-medical: Not on file  Tobacco Use   Smoking status: Former Smoker    Quit date: 06/07/1985    Years since quitting: 33.8   Smokeless tobacco: Never Used  Substance and Sexual Activity   Alcohol use: No    Frequency: Never   Drug use: No   Sexual  activity: Not on file  Lifestyle   Physical activity    Days per week: Not on file    Minutes per session: Not on file   Stress: Not on file  Relationships   Social connections    Talks on phone: Not on file    Gets together: Not on file    Attends religious service: Not on file    Active member of club or organization: Not on file    Attends meetings of clubs or organizations: Not on file    Relationship status: Not on file   Intimate partner violence    Fear of current or ex partner: Not on file    Emotionally abused: Not on file    Physically abused: Not on file    Forced sexual activity: Not on file  Other Topics Concern   Not on file  Social History Narrative   Not on file    Family History:   The patient's family history includes CAD in her brother and brother; Cancer in her father and sister; Diabetes in her mother; Hypertension in her mother; Stroke in her mother.    ROS:  Please see the history of present illness.  All other ROS reviewed and negative.     Physical Exam/Data:   Vitals:   03/31/19 0500  BP: (!) 139/57  Pulse: 86  Resp: 15  SpO2: 98%  Weight: 93.8 kg  Height: 5\' 9"  (1.753 m)   No intake or output data in the 24 hours ending 03/31/19 6045 Last 3 Weights 03/31/2019 01/17/2019 10/13/2018  Weight (lbs) 206 lb 12.7 oz 192 lb 12.8 oz 195 lb  Weight (kg) 93.8 kg 87.454 kg 88.451 kg     Body mass index is 30.54 kg/m.  General:  Elderly female in NAD HEENT: normal Neck: no JVD Vascular: No carotid bruits Cardiac:  normal S1, S2; RRR; no murmur  Lungs:  clear to auscultation bilaterally, no wheezing, rhonchi or rales  Abd: soft, nontender, no hepatomegaly  Ext: trace edema Musculoskeletal:  No deformities, BUE and BLE strength normal and equal Skin: warm and dry  Neuro:  CNs 2-12 intact, no focal abnormalities noted Psych:  Normal affect    EKG:  The ECG that was done was personally reviewed and demonstrates sinus rhythm, heart rate 78,  incomplete LBBB, Q waves V1, V2 (appears stable from prior tracing)  Relevant CV Studies:  Nuclear stress test 06/19/17: Apical aspect of the inferolateral wall worrisome for inducible ischemia. EF  34%  Heart cath 08/27/17: Chronic total occlusion of RCA with collaterals   Assessment and Plan:   1. CAD s/p multiple PCIs 2. NSTEMI - she has a known disease, last heart cath 2019 - has been turned down for CABG in [facility]  - on ASA and toprol 50 mg daily - troponin 0.27 --> 0.54 - on heparin drip - she is chest pain free - EKG with Q waves V1, V2, incomplete LBBB - appears stable from prior tracing - plan for angiography   3. Ischemic cardiomyopathy 4. Chronic systolic heart failure - ICD in place - pro-BNP at [facility] was 1460 - Lori repeat BNP here - takes 60 mg lasix daily - BMP pending   5. HTN - lopressor 50 mg TID - pressures have been well-controlled   6. HLD with LDL goal < 70 - on 20 mg lipitor - lipids pending   7. DM - trulicity and tresiba - Lori place on SSI   8. CKD stage III - sCr at [facility] was 1.3, baseline unknown - repeat BMP pending - was 1.45 01/21/19     Severity of Illness: The appropriate patient status for this patient is INPATIENT. Inpatient status is judged to be reasonable and necessary in order to provide the required intensity of service to ensure the patient's safety. The patient's presenting symptoms, physical exam findings, and initial radiographic and laboratory data in the context of their chronic comorbidities is felt to place them at high risk for further clinical deterioration. Furthermore, it is not anticipated that the patient Lori be medically stable for discharge from the hospital within 2 midnights of admission. The following factors support the patient status of inpatient.   " The patient's presenting symptoms include unstable angina. " The worrisome physical exam findings include NSTEMI. " The initial  radiographic and laboratory data are worrisome because of NSTEMI " The chronic co-morbidities include CAD   * I certify that at the point of admission it is my clinical judgment that the patient Lori require inpatient hospital care spanning beyond 2 midnights from the point of admission due to high intensity of service, high risk for further deterioration and high frequency of surveillance required.*    For questions or updates, please contact [place] Please consult [email or web address] for contact info under        Signed, Ledora Bottcher, PA  03/31/2019 6:32 AM  As above, patient seen and examined.  Briefly she is a 73 year old female with past medical history of coronary artery disease, ischemic cardiomyopathy, prior ICD, chronic combined systolic/diastolic congestive heart failure, hypertension, hyperlipidemia, diabetes mellitus, chronic stage III kidney disease admitted with non-ST elevation myocardial infarction.  Last cardiac catheterization in July 2019 showed an occluded right coronary artery and significant stenosis of the proximal ramus.  Attempt at PCI of ramus unsuccessful.  Patient developed chest heaviness that was substernal in location without radiation yesterday at noon.  There was associated nausea but no diaphoresis or dyspnea.  She took nitroglycerin with improvement in pain.  However the pain continued to recur.  She eventually was seen at [facility]  Troponin increased and she was transferred for further management.  Presently pain-free. Electrocardiogram shows sinus rhythm, nonspecific ST changes and prolonged QT interval.  Troponin I increased to 0.56 at [facility].  Creatinine is 1.29.  1 non-ST elevation myocardial infarction-patient has ruled in.  Presently pain-free.  Lori treat with aspirin, heparin, metoprolol and statin.  Proceed with cardiac catheterization following gentle hydration.  The risks and benefits including myocardial infarction, CVA and  death as well as renal insufficiency discussed and she agrees to proceed.  Check echocardiogram for LV function.  Given baseline renal insufficiency Lori limit dye and not perform ventriculogram.  2 ischemic cardiomyopathy-continue beta-blocker.  Following catheterization once renal function stable would add ARB.  Repeat echocardiogram.  3 chronic stage III kidney disease-hydrate prior to catheterization as outlined above.  Follow renal function closely after procedure.  4 hyperlipidemia-given history of coronary disease would increase Lipitor to 80 mg daily.  Lori need lipids and liver in 6 weeks.  5 prior ICD  6 history of PAT-continue beta-blocker.  Presently in sinus rhythm.  7 chronic combined systolic/diastolic congestive heart failure-we Lori hold Lasix prior to catheterization.  Resume after procedure once renal function stable.  Kirk Ruths, MD

## 2019-03-31 NOTE — CV Procedure (Signed)
   Left heart cath via right femoral artery using real-time vascular ultrasound guidance.  Attempted right radial and ulnar but arteries were too small to cannulate.  Severe three-vessel disease with heavy three-vessel calcification and extensive stenting in the right coronary and LAD.  40 to 50% left main  Proximal to mid 85% LAD & 90% diagonal (forming [place] 011 bifurcation lesion)  Mid circumflex 75% before the small second obtuse marginal.  Ramus intermedius 90%  Chronic occlusion of the proximal/ostial RCA with left to right collaterals filling a dominant RCA.  EF 35 to 40% with LVEDP 18 mmHg.  RECOMMENDATIONS: Heart team approach to determine surgery versus high risk PCI versus medical therapy.

## 2019-04-01 ENCOUNTER — Other Ambulatory Visit: Payer: Self-pay

## 2019-04-01 ENCOUNTER — Other Ambulatory Visit: Payer: Self-pay | Admitting: *Deleted

## 2019-04-01 ENCOUNTER — Inpatient Hospital Stay (HOSPITAL_COMMUNITY): Payer: Medicare Other

## 2019-04-01 ENCOUNTER — Encounter (HOSPITAL_COMMUNITY): Payer: Self-pay | Admitting: *Deleted

## 2019-04-01 DIAGNOSIS — I351 Nonrheumatic aortic (valve) insufficiency: Secondary | ICD-10-CM

## 2019-04-01 DIAGNOSIS — I340 Nonrheumatic mitral (valve) insufficiency: Secondary | ICD-10-CM

## 2019-04-01 DIAGNOSIS — I2510 Atherosclerotic heart disease of native coronary artery without angina pectoris: Secondary | ICD-10-CM

## 2019-04-01 LAB — BASIC METABOLIC PANEL
Anion gap: 9 (ref 5–15)
BUN: 20 mg/dL (ref 8–23)
CO2: 21 mmol/L — ABNORMAL LOW (ref 22–32)
Calcium: 8.2 mg/dL — ABNORMAL LOW (ref 8.9–10.3)
Chloride: 109 mmol/L (ref 98–111)
Creatinine, Ser: 1.47 mg/dL — ABNORMAL HIGH (ref 0.44–1.00)
GFR calc Af Amer: 40 mL/min — ABNORMAL LOW (ref >60)
GFR calc non Af Amer: 35 mL/min — ABNORMAL LOW (ref >60)
Glucose, Bld: 130 mg/dL — ABNORMAL HIGH (ref 70–99)
Potassium: 4.1 mmol/L (ref 3.5–5.1)
Sodium: 139 mmol/L (ref 135–145)

## 2019-04-01 LAB — CBC
HCT: 35.7 % — ABNORMAL LOW (ref 36.0–46.0)
Hemoglobin: 11.4 g/dL — ABNORMAL LOW (ref 12.0–15.0)
MCH: 29.6 pg (ref 26.0–34.0)
MCHC: 31.9 g/dL (ref 30.0–36.0)
MCV: 92.7 fL (ref 80.0–100.0)
Platelets: 186 10*3/uL (ref 150–400)
RBC: 3.85 MIL/uL — ABNORMAL LOW (ref 3.87–5.11)
RDW: 12.7 % (ref 11.5–15.5)
WBC: 7.1 10*3/uL (ref 4.0–10.5)
nRBC: 0 % (ref 0.0–0.2)

## 2019-04-01 LAB — ECHOCARDIOGRAM COMPLETE
Height: 69 in
Weight: 3308.66 oz

## 2019-04-01 LAB — GLUCOSE, CAPILLARY
Glucose-Capillary: 138 mg/dL — ABNORMAL HIGH (ref 70–99)
Glucose-Capillary: 189 mg/dL — ABNORMAL HIGH (ref 70–99)
Glucose-Capillary: 200 mg/dL — ABNORMAL HIGH (ref 70–99)
Glucose-Capillary: 192 mg/dL — ABNORMAL HIGH (ref 70–99)

## 2019-04-01 LAB — HEPARIN LEVEL (UNFRACTIONATED)
Heparin Unfractionated: 0.17 IU/mL — ABNORMAL LOW (ref 0.30–0.70)
Heparin Unfractionated: 0.24 IU/mL — ABNORMAL LOW (ref 0.30–0.70)
Heparin Unfractionated: 0.5 IU/mL (ref 0.30–0.70)

## 2019-04-01 NOTE — Progress Notes (Signed)
[place] for IV heparin Indication: NSTEMI  Allergies  Allergen Reactions  . Codeine Other (See Comments)    confused  . Penicillins Rash    Did it involve swelling of the face/tongue/throat, SOB, or low BP? Yes Did it involve sudden or severe rash/hives, skin peeling, or any reaction on the inside of your mouth or nose? No Did you need to seek medical attention at a hospital or doctor's office? Yes When did it last happen?20 yrs ago If all above answers are "NO", may proceed with cephalosporin use.     Patient Measurements: Height: 5\' 9"  (175.3 cm) Weight: 206 lb 12.7 oz (93.8 kg) IBW/kg (Calculated) : 66.2 Heparin Dosing Weight: 86.1 kg  Vital Signs: Temp: 97.9 F (36.6 C) (08/21 1509) Temp Source: Oral (08/21 1509) BP: 135/53 (08/21 1509) Pulse Rate: 71 (08/21 1509)  Labs: Recent Labs    03/31/19 0750  03/31/19 1013 04/01/19 0143 04/01/19 0738 04/01/19 1728  HGB  --   --  12.1 11.4*  --   --   HCT  --   --  37.6 35.7*  --   --   PLT  --   --  176 186  --   --   HEPARINUNFRC  --    < > 0.36 0.17* 0.24* 0.50  CREATININE 1.29*  --   --  1.47*  --   --   TROPONINIHS 330*  --   --   --   --   --    < > = values in this interval not displayed.    Estimated Creatinine Clearance: 40.3 mL/min (A) (by C-G formula based on SCr of 1.47 mg/dL (H)).    Medications:  Infusions:  . sodium chloride    . heparin 900 Units/hr (04/01/19 5643)    Assessment: 73 yo female transferred from [facility] now s/p cath. Plans pending for CABG vs high-risk PCI vs. medical rx.  -heparin level is therapeutic at 0.5 on 900 units/hr -no bleeding noted  Goal of Therapy:  Heparin level 0.3-0.7 units/ml Monitor platelets by anticoagulation protocol: Yes   Plan:  Continue heparin drip at 900 units/hr Daily heparin level, CBC Monitor for s/sx of bleeding  Thank you for involving pharmacy in this patient's care.  Renold Genta, PharmD, BCPS Clinical Pharmacist Clinical phone for 04/01/2019 until 21:30p is [phone] 04/01/2019 6:23 PM  **Pharmacist phone directory can be found on [email or web address] listed under [place]**

## 2019-04-01 NOTE — Progress Notes (Signed)
This nurse spoke with patient about her thoughts on surgery. Patient states that she does not want surgery. Asked if she let the MD know when they came around. Patient states that she did. Patient verbalizes concerns about all the tests for the surgery. Will pass on to day shift nurse for reevaluation of patient's concerns towards surgery.

## 2019-04-01 NOTE — Progress Notes (Signed)
[place] for IV heparin Indication: chest pain/ACS  Allergies  Allergen Reactions  . Codeine Other (See Comments)    confused  . Penicillins Rash    Did it involve swelling of the face/tongue/throat, SOB, or low BP? Yes Did it involve sudden or severe rash/hives, skin peeling, or any reaction on the inside of your mouth or nose? No Did you need to seek medical attention at a hospital or doctor's office? Yes When did it last happen?20 yrs ago If all above answers are "NO", may proceed with cephalosporin use.     Patient Measurements: Height: 5\' 9"  (175.3 cm) Weight: 206 lb 12.7 oz (93.8 kg) IBW/kg (Calculated) : 66.2 Heparin Dosing Weight: 86.1 kg  Vital Signs: Temp: 97.7 F (36.5 C) (08/21 0717) Temp Source: Oral (08/21 0717) BP: 126/54 (08/21 0717) Pulse Rate: 75 (08/21 0717)  Labs: Recent Labs    03/31/19 0750 03/31/19 1013 04/01/19 0143 04/01/19 0738  HGB  --  12.1 11.4*  --   HCT  --  37.6 35.7*  --   PLT  --  176 186  --   HEPARINUNFRC  --  0.36 0.17* 0.24*  CREATININE 1.29*  --  1.47*  --   TROPONINIHS 330*  --   --   --     Estimated Creatinine Clearance: 40.3 mL/min (A) (by C-G formula based on SCr of 1.47 mg/dL (H)).    Medications:  Infusions:  . sodium chloride    . heparin 750 Units/hr (03/31/19 2015)    Assessment: 73 yo female transferred from [facility] now s/p cath. Plans pending for CABG vs high-risk PCI vs. medical rx. -heparin level is below goal -Hg= 11.4, plt= 186  Goal of Therapy:  Heparin level 0.3-0.7 units/ml Monitor platelets by anticoagulation protocol: Yes   Plan:  Increase heparin to 900 units/hr Heparin level in 8 hours and daily wth CBC daily  Hildred Laser, PharmD Clinical Pharmacist **Pharmacist phone directory can now be found on [email or web address] ([email or web address]).  Listed under [place].

## 2019-04-01 NOTE — Progress Notes (Signed)
Progress Note  Patient Name: Lori Rivera Date of Encounter: 04/01/2019  Primary Cardiologist: Shirlee More, MD   Subjective   No CP or dyspnea  Inpatient Medications    Scheduled Meds: . aspirin EC  81 mg Oral Daily  . atorvastatin  80 mg Oral q1800  . insulin aspart  0-15 Units Subcutaneous TID WC  . metoprolol tartrate  50 mg Oral TID  . pneumococcal 23 valent vaccine  0.5 mL Intramuscular Tomorrow-1000  . sodium chloride flush  3 mL Intravenous Q12H   Continuous Infusions: . sodium chloride    . heparin 900 Units/hr (04/01/19 0834)   PRN Meds: sodium chloride, acetaminophen, nitroGLYCERIN, ondansetron (ZOFRAN) IV, sodium chloride flush   Vital Signs    Vitals:   04/01/19 0325 04/01/19 0717 04/01/19 0918 04/01/19 1120  BP: (!) 109/45 (!) 126/54 115/71 (!) 117/47  Pulse: 77 75 77 68  Resp: 18 18  14   Temp: 97.7 F (36.5 C) 97.7 F (36.5 C)  98.8 F (37.1 C)  TempSrc: Oral Oral  Oral  SpO2: 96% 98%  96%  Weight:      Height:        Intake/Output Summary (Last 24 hours) at 04/01/2019 1124 Last data filed at 04/01/2019 0802 Gross per 24 hour  Intake 240 ml  Output 400 ml  Net -160 ml   Last 3 Weights 03/31/2019 01/17/2019 10/13/2018  Weight (lbs) 206 lb 12.7 oz 192 lb 12.8 oz 195 lb  Weight (kg) 93.8 kg 87.454 kg 88.451 kg      Telemetry    Sinus - Personally Reviewed  Physical Exam   GEN: No acute distress.   Neck: No JVD Cardiac: RRR, no murmurs, rubs, or gallops.  Respiratory: Clear to auscultation bilaterally. GI: Soft, nontender, non-distended  MS: No edema; radial cath site with no hematoma Neuro:  Nonfocal  Psych: Normal affect   Labs    High Sensitivity Troponin:   Recent Labs  Lab 03/31/19 0750  TROPONINIHS 330*       Chemistry Recent Labs  Lab 03/31/19 0750 04/01/19 0143  NA 140 139  K 3.8 4.1  CL 107 109  CO2 23 21*  GLUCOSE 194* 130*  BUN 19 20  CREATININE 1.29* 1.47*  CALCIUM 8.6* 8.2*  GFRNONAA 40* 35*  GFRAA  47* 40*  ANIONGAP 10 9     Hematology Recent Labs  Lab 03/31/19 1013 04/01/19 0143  WBC 7.0 7.1  RBC 4.08 3.85*  HGB 12.1 11.4*  HCT 37.6 35.7*  MCV 92.2 92.7  MCH 29.7 29.6  MCHC 32.2 31.9  RDW 12.5 12.7  PLT 176 186    Cardiac cath  Severe three-vessel coronary disease including borderline significant left main disease.  The right coronary is heavily stented from proximal to distal and is totally occluded proximally.  The right coronary fills by left-to-right collaterals.  The left main dampens with engagement using a 5 French catheter.  There is 40 to 50% ostial to mid left main narrowing.  The LAD is heavily calcified and stented from proximal to distal.  At least 3 stents are noted.  There is diffuse in-stent restenosis in the proximal LAD with up to 50% narrowing.  Just distal to the stent there is eccentric 85 to 90% stenosis.  An adjacent diagonal contains proximal diffuse 80 to 90% stenosis forming a Medina 011 bifurcation stenosis.  There is distal LAD disease beyond the third stent.  The circumflex is relatively small.  The first  obtuse marginal/ramus contains segmental 95% stenosis from ostial to proximal and apparently has previously undergone attempted PCI and was labeled "non-dilatable".  The circumflex then becomes very small after the first marginal giving to small to moderate sized obtuse marginal branches with a 70% stenosis proximal to the third obtuse marginal.  Left ventricular systolic dysfunction with EF 35 to 40% with regional wall motion abnormality involving the anterior wall and inferior wall.  LVEDP is 21 mmHg consistent with chronic combined systolic and diastolic heart failure.  RECOMMENDATIONS:   Heart team approach: Need to determine surgical versus interventional versus medical therapy.  After completing the procedure and speaking to family members, apparently the patient has had altered cognitive function/interaction since her myocardial  infarction.  She suffered cardiac arrest and had 12 minutes of CPR according to the daughter.  After discussion, perhaps PCI will be her revascularization approach but will be high risk.  At this point it appears that treating the LAD diagonal or simply the LAD is technically possible although with heavy calcification and prior history of non-dilatable lesion, perhaps an atheroablative technique should be considered.  Patient Profile     73 year old female with past medical history of coronary artery disease, ischemic cardiomyopathy, prior ICD, chronic combined systolic/diastolic congestive heart failure, hypertension, hyperlipidemia, diabetes mellitus, chronic stage III kidney disease admitted with non-ST elevation myocardial infarction.    Cardiac catheterization reveals severe three-vessel coronary artery disease and EF 35-40.  Patient is being evaluated for potential coronary artery bypass and graft.  Assessment & Plan    1 non-ST elevation myocardial infarction-patient remains pain-free.  Continue aspirin, heparin, metoprolol and statin.  Cardiac catheterization revealed severe three-vessel coronary artery disease and ischemic cardiomyopathy.  Echocardiogram is pending.  Patient would best be served by coronary artery bypass and graft if she is felt to be a candidate by CVTS.  She is presently being evaluated for this with PFTs and carotid Dopplers pending.  If she is felt not to be a candidate and high risk PCI may be an option but would need further interventional review.  2 ischemic cardiomyopathy-continue beta-blocker.  Await results of echocardiogram.  Will add ARB later once it is clear renal function is stable following catheterization.  3 prior ICD  4 chronic stage III kidney disease-follow renal function closely following recent catheterization.  5 chronic combined systolic/diastolic congestive heart failure-I will continue to hold Lasix (was on 40 mg daily at home) today and resume  tomorrow if renal function stable.  6 history of PAT-continue beta-blocker.  7 hyperlipidemia-continue Lipitor.  For questions or updates, please contact [place] Please consult [email or web address] for contact info under        Signed, Kirk Ruths, MD  04/01/2019, 11:24 AM

## 2019-04-01 NOTE — Progress Notes (Signed)
  Echocardiogram 2D Echocardiogram has been performed.  Lori Rivera 04/01/2019, 2:07 PM

## 2019-04-02 DIAGNOSIS — I255 Ischemic cardiomyopathy: Secondary | ICD-10-CM

## 2019-04-02 DIAGNOSIS — I2510 Atherosclerotic heart disease of native coronary artery without angina pectoris: Secondary | ICD-10-CM

## 2019-04-02 LAB — GLUCOSE, CAPILLARY
Glucose-Capillary: 147 mg/dL — ABNORMAL HIGH (ref 70–99)
Glucose-Capillary: 222 mg/dL — ABNORMAL HIGH (ref 70–99)
Glucose-Capillary: 201 mg/dL — ABNORMAL HIGH (ref 70–99)
Glucose-Capillary: 197 mg/dL — ABNORMAL HIGH (ref 70–99)
Glucose-Capillary: 170 mg/dL — ABNORMAL HIGH (ref 70–99)

## 2019-04-02 LAB — CBC
HCT: 35.7 % — ABNORMAL LOW (ref 36.0–46.0)
Hemoglobin: 11.4 g/dL — ABNORMAL LOW (ref 12.0–15.0)
MCH: 29.5 pg (ref 26.0–34.0)
MCHC: 31.9 g/dL (ref 30.0–36.0)
MCV: 92.2 fL (ref 80.0–100.0)
Platelets: 173 10*3/uL (ref 150–400)
RBC: 3.87 MIL/uL (ref 3.87–5.11)
RDW: 12.7 % (ref 11.5–15.5)
WBC: 7.3 10*3/uL (ref 4.0–10.5)
nRBC: 0 % (ref 0.0–0.2)

## 2019-04-02 LAB — BASIC METABOLIC PANEL
Anion gap: 9 (ref 5–15)
BUN: 26 mg/dL — ABNORMAL HIGH (ref 8–23)
CO2: 23 mmol/L (ref 22–32)
Calcium: 8.4 mg/dL — ABNORMAL LOW (ref 8.9–10.3)
Chloride: 108 mmol/L (ref 98–111)
Creatinine, Ser: 1.65 mg/dL — ABNORMAL HIGH (ref 0.44–1.00)
GFR calc Af Amer: 35 mL/min — ABNORMAL LOW (ref >60)
GFR calc non Af Amer: 30 mL/min — ABNORMAL LOW (ref >60)
Glucose, Bld: 143 mg/dL — ABNORMAL HIGH (ref 70–99)
Potassium: 4.1 mmol/L (ref 3.5–5.1)
Sodium: 140 mmol/L (ref 135–145)

## 2019-04-02 LAB — HEPARIN LEVEL (UNFRACTIONATED): Heparin Unfractionated: 0.43 IU/mL (ref 0.30–0.70)

## 2019-04-02 MED ORDER — ACETAMINOPHEN 325 MG PO TABS: 650.0000 mg | ORAL_TABLET | ORAL | Status: DC | PRN

## 2019-04-02 NOTE — Plan of Care (Signed)

## 2019-04-02 NOTE — Progress Notes (Signed)
      [address]       [place] [address]             [phone]    The patient was referred for consideration of bypass surgery.  Dr. Orvan Seen saw the patient yesterday and discussed with her the risks, options and expectations of coronary artery bypass graft.  No definite decision about surgery had been made, the patient initially had agreed to limited preop testing that is routinely done before bypass surgery and then consider her options.  Today her daughter came in, and after their discussion the patient has decided that she does not wish to pursue considering coronary artery bypass grafting.  We will cancel the pre-CAB testing.  Grace Isaac MD      [address] [place] [address] Office [phone]   [place]

## 2019-04-02 NOTE — Progress Notes (Signed)
Progress Note  Patient Name: Lori Rivera Date of Encounter: 04/02/2019  Primary Cardiologist: Shirlee More, MD   Subjective   No chest pain or shortness of breath.  Inpatient Medications    Scheduled Meds: . aspirin EC  81 mg Oral Daily  . atorvastatin  80 mg Oral q1800  . insulin aspart  0-15 Units Subcutaneous TID WC  . metoprolol tartrate  50 mg Oral TID  . pneumococcal 23 valent vaccine  0.5 mL Intramuscular Tomorrow-1000  . sodium chloride flush  3 mL Intravenous Q12H   Continuous Infusions: . sodium chloride    . heparin 900 Units/hr (04/01/19 2121)   PRN Meds: sodium chloride, acetaminophen, nitroGLYCERIN, ondansetron (ZOFRAN) IV, sodium chloride flush   Vital Signs    Vitals:   04/02/19 0330 04/02/19 0715 04/02/19 1027 04/02/19 1115  BP: (!) 139/53 (!) 135/57 (!) 127/49 (!) 119/47  Pulse: 70 72 79 66  Resp: 14 20  13   Temp: 98 F (36.7 C) 98 F (36.7 C)  98 F (36.7 C)  TempSrc: Oral Oral  Oral  SpO2: 98% 96%  98%  Weight:      Height:        Intake/Output Summary (Last 24 hours) at 04/02/2019 1146 Last data filed at 04/02/2019 0745 Gross per 24 hour  Intake 1267.2 ml  Output 700 ml  Net 567.2 ml   Last 3 Weights 03/31/2019 01/17/2019 10/13/2018  Weight (lbs) 206 lb 12.7 oz 192 lb 12.8 oz 195 lb  Weight (kg) 93.8 kg 87.454 kg 88.451 kg      Telemetry    Sinus rhythm- Personally Reviewed  Physical Exam   GEN: Well nourished, well developed, in no acute distress  HEENT: normal  Neck: no JVD, carotid bruits, or masses Cardiac: RRR; no murmurs, rubs, or gallops,no edema  Respiratory:  clear to auscultation bilaterally, normal work of breathing GI: soft, nontender, nondistended, + BS MS: no deformity or atrophy  Skin: warm and dry, device site well healed Neuro:  Strength and sensation are intact Psych: euthymic mood, full affect   Labs    High Sensitivity Troponin:   Recent Labs  Lab 03/31/19 0750  TROPONINIHS 330*        Chemistry Recent Labs  Lab 03/31/19 0750 04/01/19 0143 04/02/19 0227  NA 140 139 140  K 3.8 4.1 4.1  CL 107 109 108  CO2 23 21* 23  GLUCOSE 194* 130* 143*  BUN 19 20 26*  CREATININE 1.29* 1.47* 1.65*  CALCIUM 8.6* 8.2* 8.4*  GFRNONAA 40* 35* 30*  GFRAA 47* 40* 35*  ANIONGAP 10 9 9      Hematology Recent Labs  Lab 03/31/19 1013 04/01/19 0143 04/02/19 0227  WBC 7.0 7.1 7.3  RBC 4.08 3.85* 3.87  HGB 12.1 11.4* 11.4*  HCT 37.6 35.7* 35.7*  MCV 92.2 92.7 92.2  MCH 29.7 29.6 29.5  MCHC 32.2 31.9 31.9  RDW 12.5 12.7 12.7  PLT 176 186 173    Cardiac cath  Severe three-vessel coronary disease including borderline significant left main disease.  The right coronary is heavily stented from proximal to distal and is totally occluded proximally.  The right coronary fills by left-to-right collaterals.  The left main dampens with engagement using a 5 French catheter.  There is 40 to 50% ostial to mid left main narrowing.  The LAD is heavily calcified and stented from proximal to distal.  At least 3 stents are noted.  There is diffuse in-stent restenosis in the  proximal LAD with up to 50% narrowing.  Just distal to the stent there is eccentric 85 to 90% stenosis.  An adjacent diagonal contains proximal diffuse 80 to 90% stenosis forming a Medina 011 bifurcation stenosis.  There is distal LAD disease beyond the third stent.  The circumflex is relatively small.  The first obtuse marginal/ramus contains segmental 95% stenosis from ostial to proximal and apparently has previously undergone attempted PCI and was labeled "non-dilatable".  The circumflex then becomes very small after the first marginal giving to small to moderate sized obtuse marginal branches with a 70% stenosis proximal to the third obtuse marginal.  Left ventricular systolic dysfunction with EF 35 to 40% with regional wall motion abnormality involving the anterior wall and inferior wall.  LVEDP is 21 mmHg consistent with  chronic combined systolic and diastolic heart failure.  RECOMMENDATIONS:   Heart team approach: Need to determine surgical versus interventional versus medical therapy.  After completing the procedure and speaking to family members, apparently the patient has had altered cognitive function/interaction since her myocardial infarction.  She suffered cardiac arrest and had 12 minutes of CPR according to the daughter.  After discussion, perhaps PCI Tyshae Stair be her revascularization approach but Loys Shugars be high risk.  At this point it appears that treating the LAD diagonal or simply the LAD is technically possible although with heavy calcification and prior history of non-dilatable lesion, perhaps an atheroablative technique should be considered.  Patient Profile     73 year old female with past medical history of coronary artery disease, ischemic cardiomyopathy, prior ICD, chronic combined systolic/diastolic congestive heart failure, hypertension, hyperlipidemia, diabetes mellitus, chronic stage III kidney disease admitted with non-ST elevation myocardial infarction.    Cardiac catheterization reveals severe three-vessel coronary artery disease and EF 35-40.  Patient is being evaluated for potential coronary artery bypass and graft.  Assessment & Plan    1 non-ST elevation myocardial infarction-patient remains pain-free.  Continue aspirin, heparin, metoprolol, statin.  She has severe three-vessel disease.  Transthoracic echo shows an ejection fraction 35 to 40%.  She is currently being evaluated by cardiac surgery.  PFTs and a Dopplers are pending.  Pia Jedlicka await their direction.  If she is turned down for surgery may be a candidate for high risk PCI.   2 ischemic cardiomyopathy-continue beta-blocker.  Jvion Turgeon add ARB once renal function is stabilized.  3 prior ICD  4 chronic stage III kidney disease-overall stable since recent cath  5 chronic combined systolic/diastolic congestive heart failure-no obvious  volume overload.  Continue to hold Lasix.  6 history of PAT-continue beta-blocker  7 hyperlipidemia-continue Lipitor  For questions or updates, please contact [place] Please consult [email or web address] for contact info under        Signed, Addison Freimuth Meredith Leeds, MD  04/02/2019, 11:46 AM

## 2019-04-02 NOTE — Progress Notes (Signed)
[place] for IV heparin Indication: NSTEMI  Allergies  Allergen Reactions  . Codeine Other (See Comments)    confused  . Penicillins Rash    Did it involve swelling of the face/tongue/throat, SOB, or low BP? Yes Did it involve sudden or severe rash/hives, skin peeling, or any reaction on the inside of your mouth or nose? No Did you need to seek medical attention at a hospital or doctor's office? Yes When did it last happen?20 yrs ago If all above answers are "NO", may proceed with cephalosporin use.     Patient Measurements: Height: 5\' 9"  (175.3 cm) Weight: 206 lb 12.7 oz (93.8 kg) IBW/kg (Calculated) : 66.2 Heparin Dosing Weight: 86.1 kg  Vital Signs: Temp: 98 F (36.7 C) (08/22 0330) Temp Source: Oral (08/22 0330) BP: 139/53 (08/22 0330) Pulse Rate: 70 (08/22 0330)  Labs: Recent Labs    03/31/19 0750  03/31/19 1013 04/01/19 0143 04/01/19 0738 04/01/19 1728 04/02/19 0227  HGB  --    < > 12.1 11.4*  --   --  11.4*  HCT  --   --  37.6 35.7*  --   --  35.7*  PLT  --   --  176 186  --   --  173  HEPARINUNFRC  --    < > 0.36 0.17* 0.24* 0.50 0.43  CREATININE 1.29*  --   --  1.47*  --   --  1.65*  TROPONINIHS 330*  --   --   --   --   --   --    < > = values in this interval not displayed.    Estimated Creatinine Clearance: 35.9 mL/min (A) (by C-G formula based on SCr of 1.65 mg/dL (H)).    Medications:  Infusions:  . sodium chloride    . heparin 900 Units/hr (04/01/19 2121)    Assessment: 73 yo female transferred from [facility] now s/p cath. Plans pending for CABG vs high-risk PCI vs. medical rx.  -heparin level is therapeutic at 0.43 on 900 units/hr -CBC stable from yesterday -no bleeding noted  Goal of Therapy:  Heparin level 0.3-0.7 units/ml Monitor platelets by anticoagulation protocol: Yes   Plan:  Continue heparin drip at 900 units/hr Daily heparin level, CBC Monitor for s/sx of  bleeding  Thank you for involving pharmacy in this patient's care.  Vertis Kelch, PharmD PGY2 Cardiology Pharmacy Resident Phone [phone] 04/02/2019       7:44 AM  Please check [email or web address] for unit-specific pharmacist phone numbers

## 2019-04-03 LAB — GLUCOSE, CAPILLARY
Glucose-Capillary: 163 mg/dL — ABNORMAL HIGH (ref 70–99)
Glucose-Capillary: 194 mg/dL — ABNORMAL HIGH (ref 70–99)
Glucose-Capillary: 200 mg/dL — ABNORMAL HIGH (ref 70–99)
Glucose-Capillary: 192 mg/dL — ABNORMAL HIGH (ref 70–99)

## 2019-04-03 LAB — CBC
HCT: 33.6 % — ABNORMAL LOW (ref 36.0–46.0)
HCT: 35 % — ABNORMAL LOW (ref 36.0–46.0)
Hemoglobin: 10.7 g/dL — ABNORMAL LOW (ref 12.0–15.0)
Hemoglobin: 11.4 g/dL — ABNORMAL LOW (ref 12.0–15.0)
MCH: 29.6 pg (ref 26.0–34.0)
MCH: 30.3 pg (ref 26.0–34.0)
MCHC: 31.8 g/dL (ref 30.0–36.0)
MCHC: 32.6 g/dL (ref 30.0–36.0)
MCV: 93.1 fL (ref 80.0–100.0)
MCV: 93.1 fL (ref 80.0–100.0)
Platelets: 168 10*3/uL (ref 150–400)
Platelets: 157 10*3/uL (ref 150–400)
RBC: 3.61 MIL/uL — ABNORMAL LOW (ref 3.87–5.11)
RBC: 3.76 MIL/uL — ABNORMAL LOW (ref 3.87–5.11)
RDW: 12.7 % (ref 11.5–15.5)
RDW: 12.7 % (ref 11.5–15.5)
WBC: 7.2 10*3/uL (ref 4.0–10.5)
WBC: 7.1 10*3/uL (ref 4.0–10.5)
nRBC: 0 % (ref 0.0–0.2)
nRBC: 0 % (ref 0.0–0.2)

## 2019-04-03 LAB — BASIC METABOLIC PANEL
Anion gap: 9 (ref 5–15)
BUN: 33 mg/dL — ABNORMAL HIGH (ref 8–23)
CO2: 21 mmol/L — ABNORMAL LOW (ref 22–32)
Calcium: 8.4 mg/dL — ABNORMAL LOW (ref 8.9–10.3)
Chloride: 104 mmol/L (ref 98–111)
Creatinine, Ser: 2.29 mg/dL — ABNORMAL HIGH (ref 0.44–1.00)
GFR calc Af Amer: 23 mL/min — ABNORMAL LOW (ref >60)
GFR calc non Af Amer: 20 mL/min — ABNORMAL LOW (ref >60)
Glucose, Bld: 210 mg/dL — ABNORMAL HIGH (ref 70–99)
Potassium: 4.8 mmol/L (ref 3.5–5.1)
Sodium: 134 mmol/L — ABNORMAL LOW (ref 135–145)

## 2019-04-03 LAB — HEPARIN LEVEL (UNFRACTIONATED): Heparin Unfractionated: 0.39 IU/mL (ref 0.30–0.70)

## 2019-04-03 LAB — BRAIN NATRIURETIC PEPTIDE: B Natriuretic Peptide: 332 pg/mL — ABNORMAL HIGH (ref 0.0–100.0)

## 2019-04-03 MED ORDER — ASPIRIN EC 81 MG PO TBEC
81.0000 mg | DELAYED_RELEASE_TABLET | Freq: Every day | ORAL | Status: DC
  Administered 2019-04-04 – 2019-04-05 (×2): 81 mg via ORAL
  Filled 2019-04-03 (×2): qty 1

## 2019-04-03 MED ORDER — ONDANSETRON HCL 4 MG/2ML IJ SOLN: 4.0000 mg | Freq: Four times a day (QID) | INTRAMUSCULAR | Status: DC | PRN

## 2019-04-03 MED ORDER — NITROGLYCERIN 0.4 MG SL SUBL
0.4000 mg | SUBLINGUAL_TABLET | SUBLINGUAL | Status: DC | PRN
  Administered 2019-04-04: 03:00:00 0.4 mg via SUBLINGUAL
  Filled 2019-04-03: qty 1

## 2019-04-03 NOTE — Plan of Care (Signed)

## 2019-04-03 NOTE — Progress Notes (Addendum)
[place] for IV heparin Indication: NSTEMI  Allergies  Allergen Reactions  . Codeine Other (See Comments)    confused  . Penicillins Rash    Did it involve swelling of the face/tongue/throat, SOB, or low BP? Yes Did it involve sudden or severe rash/hives, skin peeling, or any reaction on the inside of your mouth or nose? No Did you need to seek medical attention at a hospital or doctor's office? Yes When did it last happen?20 yrs ago If all above answers are "NO", may proceed with cephalosporin use.     Patient Measurements: Height: 5\' 9"  (175.3 cm) Weight: 206 lb 12.7 oz (93.8 kg) IBW/kg (Calculated) : 66.2 Heparin Dosing Weight: 86.1 kg  Vital Signs: Temp: 97.5 F (36.4 C) (08/23 0724) Temp Source: Oral (08/23 0724) BP: 125/50 (08/23 0300) Pulse Rate: 70 (08/23 0300)  Labs: Recent Labs    03/31/19 0750  04/01/19 0143  04/01/19 1728 04/02/19 0227 04/03/19 0226  HGB  --    < > 11.4*  --   --  11.4* 10.7*  HCT  --    < > 35.7*  --   --  35.7* 33.6*  PLT  --    < > 186  --   --  173 168  HEPARINUNFRC  --    < > 0.17*   < > 0.50 0.43 0.39  CREATININE 1.29*  --  1.47*  --   --  1.65*  --   TROPONINIHS 330*  --   --   --   --   --   --    < > = values in this interval not displayed.    Estimated Creatinine Clearance: 35.9 mL/min (A) (by C-G formula based on SCr of 1.65 mg/dL (H)).    Medications:  Infusions:  . sodium chloride    . heparin 900 Units/hr (04/01/19 2121)    Assessment: 73 yo female transferred from [facility] now s/p cath.  Plans pending for CABG vs high-risk PCI vs. medical rx. TCTS noted that pre-CABG testing has now been cancelled due to patient not wanting to pursue CABG.  -heparin level is therapeutic at 0.39 on 900 units/hr -CBC relatively stable from yesterday -no bleeding noted  Goal of Therapy:  Heparin level 0.3-0.7 units/ml Monitor platelets by anticoagulation protocol: Yes    Plan:  Continue heparin drip at 900 units/hr Daily heparin level, CBC Monitor for s/sx of bleeding  Thank you for involving pharmacy in this patient's care.  Vertis Kelch, PharmD PGY2 Cardiology Pharmacy Resident Phone [phone] 04/03/2019       7:31 AM  Please check [email or web address] for unit-specific pharmacist phone numbers

## 2019-04-03 NOTE — Progress Notes (Signed)
Progress Note  Patient Name: Lori Rivera Date of Encounter: 04/03/2019  Primary Cardiologist: Shirlee More, MD   Subjective   Patient feeling well today without chest pain or shortness of breath.  Inpatient Medications    Scheduled Meds: . aspirin EC  81 mg Oral Daily  . atorvastatin  80 mg Oral q1800  . insulin aspart  0-15 Units Subcutaneous TID WC  . metoprolol tartrate  50 mg Oral TID  . pneumococcal 23 valent vaccine  0.5 mL Intramuscular Tomorrow-1000  . sodium chloride flush  3 mL Intravenous Q12H   Continuous Infusions: . sodium chloride    . heparin 900 Units/hr (04/01/19 2121)   PRN Meds: sodium chloride, acetaminophen, nitroGLYCERIN, ondansetron (ZOFRAN) IV, sodium chloride flush   Vital Signs    Vitals:   04/02/19 1951 04/02/19 2300 04/03/19 0300 04/03/19 0724  BP: (!) 110/51 (!) 109/42 (!) 125/50   Pulse: 68 68 70   Resp: 15 17 16    Temp: 98.3 F (36.8 C) (!) 97.5 F (36.4 C) 98.1 F (36.7 C) (!) 97.5 F (36.4 C)  TempSrc: Oral Oral Oral Oral  SpO2: 99% 97% 99%   Weight:      Height:        Intake/Output Summary (Last 24 hours) at 04/03/2019 0747 Last data filed at 04/03/2019 0500 Gross per 24 hour  Intake 225 ml  Output -  Net 225 ml   Last 3 Weights 03/31/2019 01/17/2019 10/13/2018  Weight (lbs) 206 lb 12.7 oz 192 lb 12.8 oz 195 lb  Weight (kg) 93.8 kg 87.454 kg 88.451 kg      Telemetry    Atrial paced- Personally Reviewed  Physical Exam   GEN: Well nourished, well developed, in no acute distress  HEENT: normal  Neck: no JVD, carotid bruits, or masses Cardiac: RRR; no murmurs, rubs, or gallops,no edema  Respiratory:  clear to auscultation bilaterally, normal work of breathing GI: soft, nontender, nondistended, + BS MS: no deformity or atrophy  Skin: warm and dry, device site well healed Neuro:  Strength and sensation are intact Psych: euthymic mood, full affect    Labs    High Sensitivity Troponin:   Recent Labs  Lab  03/31/19 0750  TROPONINIHS 330*       Chemistry Recent Labs  Lab 03/31/19 0750 04/01/19 0143 04/02/19 0227  NA 140 139 140  K 3.8 4.1 4.1  CL 107 109 108  CO2 23 21* 23  GLUCOSE 194* 130* 143*  BUN 19 20 26*  CREATININE 1.29* 1.47* 1.65*  CALCIUM 8.6* 8.2* 8.4*  GFRNONAA 40* 35* 30*  GFRAA 47* 40* 35*  ANIONGAP 10 9 9      Hematology Recent Labs  Lab 04/01/19 0143 04/02/19 0227 04/03/19 0226  WBC 7.1 7.3 7.2  RBC 3.85* 3.87 3.61*  HGB 11.4* 11.4* 10.7*  HCT 35.7* 35.7* 33.6*  MCV 92.7 92.2 93.1  MCH 29.6 29.5 29.6  MCHC 31.9 31.9 31.8  RDW 12.7 12.7 12.7  PLT 186 173 168    Cardiac cath  Severe three-vessel coronary disease including borderline significant left main disease.  The right coronary is heavily stented from proximal to distal and is totally occluded proximally.  The right coronary fills by left-to-right collaterals.  The left main dampens with engagement using a 5 French catheter.  There is 40 to 50% ostial to mid left main narrowing.  The LAD is heavily calcified and stented from proximal to distal.  At least 3 stents are noted.  There is diffuse in-stent restenosis in the proximal LAD with up to 50% narrowing.  Just distal to the stent there is eccentric 85 to 90% stenosis.  An adjacent diagonal contains proximal diffuse 80 to 90% stenosis forming a Medina 011 bifurcation stenosis.  There is distal LAD disease beyond the third stent.  The circumflex is relatively small.  The first obtuse marginal/ramus contains segmental 95% stenosis from ostial to proximal and apparently has previously undergone attempted PCI and was labeled "non-dilatable".  The circumflex then becomes very small after the first marginal giving to small to moderate sized obtuse marginal branches with a 70% stenosis proximal to the third obtuse marginal.  Left ventricular systolic dysfunction with EF 35 to 40% with regional wall motion abnormality involving the anterior wall and  inferior wall.  LVEDP is 21 mmHg consistent with chronic combined systolic and diastolic heart failure.  RECOMMENDATIONS:   Heart team approach: Need to determine surgical versus interventional versus medical therapy.  After completing the procedure and speaking to family members, apparently the patient has had altered cognitive function/interaction since her myocardial infarction.  She suffered cardiac arrest and had 12 minutes of CPR according to the daughter.  After discussion, perhaps PCI Elan Brainerd be her revascularization approach but Lysha Schrade be high risk.  At this point it appears that treating the LAD diagonal or simply the LAD is technically possible although with heavy calcification and prior history of non-dilatable lesion, perhaps an atheroablative technique should be considered.  Patient Profile     73 year old female with past medical history of coronary artery disease, ischemic cardiomyopathy, prior ICD, chronic combined systolic/diastolic congestive heart failure, hypertension, hyperlipidemia, diabetes mellitus, chronic stage III kidney disease admitted with non-ST elevation myocardial infarction.    Cardiac catheterization reveals severe three-vessel coronary artery disease and EF 35-40.  Patient is being evaluated for potential coronary artery bypass and graft.  Assessment & Plan    1. Non-ST elevation myocardial infarction: Patient remains pain-free.  Currently on aspirin, heparin, metoprolol, statin.  She does have severe three-vessel disease.  She was undergoing work-up with cardiac surgery for possible CABG, but both her and her daughter have declined this therapy.  We Benelli Winther need to have a discussion with interventional cardiology whether or not there is an interventional option to treat her severe coronary artery disease.  We Helio Lack keep her n.p.o. after midnight for possible catheterization tomorrow.  2 ischemic cardiomyopathy: Continue beta-blocker.  Clothilde Tippetts add ARB post catheterization.   3 prior ICD  4 chronic stage III kidney disease: Overall stable since recent catheterization.  5 chronic combined systolic/diastolic congestive heart failure: No obvious signs of volume overload  6 history of PAT continue beta-blocker  7 hyperlipidemia continue Lipitor  For questions or updates, please contact [facility] Please consult [email or web address] for contact info under        Signed, Kerie Badger Meredith Leeds, MD  04/03/2019, 7:47 AM

## 2019-04-04 ENCOUNTER — Inpatient Hospital Stay (HOSPITAL_COMMUNITY): Payer: Medicare Other

## 2019-04-04 ENCOUNTER — Encounter (HOSPITAL_COMMUNITY): Payer: Medicare Other

## 2019-04-04 ENCOUNTER — Encounter (HOSPITAL_COMMUNITY): Payer: Self-pay | Admitting: *Deleted

## 2019-04-04 LAB — BASIC METABOLIC PANEL
Anion gap: 7 (ref 5–15)
BUN: 35 mg/dL — ABNORMAL HIGH (ref 8–23)
CO2: 24 mmol/L (ref 22–32)
Calcium: 8.4 mg/dL — ABNORMAL LOW (ref 8.9–10.3)
Chloride: 106 mmol/L (ref 98–111)
Creatinine, Ser: 2.13 mg/dL — ABNORMAL HIGH (ref 0.44–1.00)
GFR calc Af Amer: 26 mL/min — ABNORMAL LOW (ref >60)
GFR calc non Af Amer: 22 mL/min — ABNORMAL LOW (ref >60)
Glucose, Bld: 239 mg/dL — ABNORMAL HIGH (ref 70–99)
Potassium: 4.7 mmol/L (ref 3.5–5.1)
Sodium: 137 mmol/L (ref 135–145)

## 2019-04-04 LAB — GLUCOSE, CAPILLARY
Glucose-Capillary: 226 mg/dL — ABNORMAL HIGH (ref 70–99)
Glucose-Capillary: 191 mg/dL — ABNORMAL HIGH (ref 70–99)
Glucose-Capillary: 163 mg/dL — ABNORMAL HIGH (ref 70–99)
Glucose-Capillary: 169 mg/dL — ABNORMAL HIGH (ref 70–99)
Glucose-Capillary: 188 mg/dL — ABNORMAL HIGH (ref 70–99)

## 2019-04-04 LAB — HEPARIN LEVEL (UNFRACTIONATED)
Heparin Unfractionated: 0.23 IU/mL — ABNORMAL LOW (ref 0.30–0.70)
Heparin Unfractionated: 0.2 IU/mL — ABNORMAL LOW (ref 0.30–0.70)
Heparin Unfractionated: 0.95 IU/mL — ABNORMAL HIGH (ref 0.30–0.70)

## 2019-04-04 NOTE — Progress Notes (Signed)
ANTICOAGULATION CONSULT NOTE - Follow Up Consult  Pharmacy Consult for heparin Indication: CAD awaiting possible PCI  Labs: Recent Labs    04/02/19 0227 04/03/19 0226 04/03/19 1946 04/04/19 0231  HGB 11.4* 10.7* 11.4*  --   HCT 35.7* 33.6* 35.0*  --   PLT 173 168 157  --   HEPARINUNFRC 0.43 0.39  --  0.23*  CREATININE 1.65*  --  2.29* 2.13*    Assessment: 73yo female still subtherapeutic on heparin after several levels at goal though had been trending down; no gtt issues or signs of bleeding per RN.   Goal of Therapy:  Heparin level 0.3-0.7 units/ml   Plan:  Increase heparin to 1300 units/hr Recheck level tonight  Erin Hearing PharmD., BCPS Clinical Pharmacist 04/04/2019 1:13 PM

## 2019-04-04 NOTE — Progress Notes (Signed)
Progress Note  Patient Name: Lori Rivera Date of Encounter: 04/04/2019  Primary Cardiologist: Shirlee More, MD   Subjective   Currently resting comfortably in bed, flat, no chest pain, no shortness of breath.  Inpatient Medications    Scheduled Meds: . aspirin EC  81 mg Oral Daily  . atorvastatin  80 mg Oral q1800  . insulin aspart  0-15 Units Subcutaneous TID WC  . metoprolol tartrate  50 mg Oral TID  . pneumococcal 23 valent vaccine  0.5 mL Intramuscular Tomorrow-1000  . sodium chloride flush  3 mL Intravenous Q12H   Continuous Infusions: . sodium chloride    . heparin 1,100 Units/hr (04/04/19 0356)   PRN Meds: sodium chloride, acetaminophen, nitroGLYCERIN, ondansetron (ZOFRAN) IV, sodium chloride flush   Vital Signs    Vitals:   04/04/19 0314 04/04/19 0320 04/04/19 0730 04/04/19 0752  BP: (!) 141/54 (!) 113/48 (!) 121/57 (!) 113/40  Pulse: 70 72 70 70  Resp:  (!) 25 20 20   Temp:    98.1 F (36.7 C)  TempSrc:    Oral  SpO2:  (!) 89% 99% 98%  Weight:      Height:        Intake/Output Summary (Last 24 hours) at 04/04/2019 0902 Last data filed at 04/04/2019 0753 Gross per 24 hour  Intake -  Output 1250 ml  Net -1250 ml   Last 3 Weights 04/04/2019 03/31/2019 01/17/2019  Weight (lbs) 217 lb 6 oz 206 lb 12.7 oz 192 lb 12.8 oz  Weight (kg) 98.6 kg 93.8 kg 87.454 kg      Telemetry    Continued atrial pacing- Personally Reviewed  ECG    Atrial pacing- Personally Reviewed  Physical Exam   GEN: No acute distress.   Neck: No JVD Cardiac: RRR, no murmurs, rubs, or gallops.  Respiratory: Clear to auscultation bilaterally. GI: Soft, nontender, non-distended  MS: No edema; No deformity. Neuro:  Nonfocal  Psych: Normal affect, pleasant  Labs    High Sensitivity Troponin:   Recent Labs  Lab 03/31/19 0750  TROPONINIHS 330*      Cardiac EnzymesNo results for input(s): TROPONINI in the last 168 hours. No results for input(s): TROPIPOC in the last 168  hours.   Chemistry Recent Labs  Lab 04/02/19 0227 04/03/19 1946 04/04/19 0231  NA 140 134* 137  K 4.1 4.8 4.7  CL 108 104 106  CO2 23 21* 24  GLUCOSE 143* 210* 239*  BUN 26* 33* 35*  CREATININE 1.65* 2.29* 2.13*  CALCIUM 8.4* 8.4* 8.4*  GFRNONAA 30* 20* 22*  GFRAA 35* 23* 26*  ANIONGAP 9 9 7      Hematology Recent Labs  Lab 04/02/19 0227 04/03/19 0226 04/03/19 1946  WBC 7.3 7.2 7.1  RBC 3.87 3.61* 3.76*  HGB 11.4* 10.7* 11.4*  HCT 35.7* 33.6* 35.0*  MCV 92.2 93.1 93.1  MCH 29.5 29.6 30.3  MCHC 31.9 31.8 32.6  RDW 12.7 12.7 12.7  PLT 173 168 157    BNP Recent Labs  Lab 04/03/19 2240  BNP 332.0*     DDimer No results for input(s): DDIMER in the last 168 hours.   Radiology    No results found.  Cardiac Studies   Cardiac catheterization - Severe three-vessel disease.  Daughter and she have declined CABG.  Further discussion with interventional team. -After discussion, perhaps PCI will be her revascularization approach but will be high risk. At this point it appears that treating the LAD diagonal or simply the  LAD is technically possible although with heavy calcification and prior history of non-dilatable lesion, perhaps an atheroablative technique should be considered.  Patient Profile     73 y.o. female with severe three-vessel CAD, declining CABG, with non-ST elevation myocardial infarction ischemic cardiomyopathy EF 35%, ICD, atrial pacing, diabetes with hypertension and chronic kidney disease stage III.  Assessment & Plan    Non-ST elevation myocardial infarction Severe CAD Ischemic cardiomyopathy - Currently pain-free. - Aspirin heparin metoprolol statin - Severe three-vessel CAD however declined CABG option after full discussion with surgical team. - Consideration of potential PCI.  I put in a call to Dr. Claiborne Billings who is in the catheterization lab to review films and discussed with interventional team.    Ischemic cardiomyopathy - Currently on  beta-blocker.  EF 35 to 40%.  Add angiotensin receptor blocker post-cath.  ICD - Atrial pacing previously noted.  Chronic kidney disease stage III - Increase in creatinine from 1.5-2.2 post catheterization.  Currently trending downward to 2.13 this morning.  Possibly result of dye load.  We will have to weigh this risks and benefit if PCI is attempted.   Chronic combined systolic and diastolic heart failure - No overload at this point.  Beta-blocker.  Awaiting ARB post-cath.  Paroxysmal atrial tachycardia -Beta-blocker.  Hyperlipidemia -High intensity statin.  For questions or updates, please contact [place] Please consult [email or web address] for contact info under        Signed, Candee Furbish, MD  04/04/2019, 9:02 AM

## 2019-04-04 NOTE — Progress Notes (Signed)
Discussed with NP if pt is going for PCI today , claimed to go ahead ordering diet for pt.

## 2019-04-04 NOTE — Progress Notes (Signed)
ANTICOAGULATION CONSULT NOTE - Follow Up Consult  Pharmacy Consult for heparin Indication: CAD awaiting possible PCI  Labs: Recent Labs    04/02/19 0227 04/03/19 0226 04/03/19 1946 04/04/19 0231 04/04/19 1251 04/04/19 2201  HGB 11.4* 10.7* 11.4*  --   --   --   HCT 35.7* 33.6* 35.0*  --   --   --   PLT 173 168 157  --   --   --   HEPARINUNFRC 0.43 0.39  --  0.23* 0.20* 0.95*  CREATININE 1.65*  --  2.29* 2.13*  --   --     Assessment: 73yo female still on heparin for ACS and possible PCI. Heparin drip increased from 1100>1300 uts/hr HL jump 0.2>0.9  Possible drawn for close to infusion  - will make small change and reassess in am No s/s of bleeding per RN.   Goal of Therapy:  Heparin level 0.3-0.7 units/ml   Plan:  Decrease heparin to 1200 units/hr Daily HL, CBC  Bonnita Nasuti Pharm.D. CPP, BCPS Clinical Pharmacist [phone] 04/04/2019 11:02 PM

## 2019-04-04 NOTE — Plan of Care (Signed)
  Problem: Education: Goal: Knowledge of General Education information will improve Description: Including pain rating scale, medication(s)/side effects and non-pharmacologic comfort measures 04/04/2019 0923 by Collene Mares I, RN Outcome: Progressing 04/04/2019 0921 by Collene Mares I, RN Outcome: Progressing

## 2019-04-04 NOTE — Progress Notes (Addendum)
Inpatient Diabetes Program Recommendations  AACE/ADA: New Consensus Statement on Inpatient Glycemic Control   Target Ranges:  Prepandial:   less than 140 mg/dL      Peak postprandial:   less than 180 mg/dL (1-2 hours)      Critically ill patients:  140 - 180 mg/dL   Results for Lori Rivera, Lori Rivera (MRN [id number]) as of 04/04/2019 09:41  Ref. Range 04/03/2019 06:21 04/03/2019 11:03 04/03/2019 16:20 04/03/2019 21:29 04/04/2019 06:19 04/04/2019 08:50  Glucose-Capillary Latest Ref Range: 70 - 99 mg/dL 163 (H) 194 (H) 200 (H) 192 (H) 226 (H) 191 (H)   Review of Glycemic Control  Diabetes history: DM2 Outpatient Diabetes medications: Tresiba 10 units daily, Jardiance 10 mg daily Current orders for Inpatient glycemic control: Novolog 0-15 units TID with meals  Inpatient Diabetes Program Recommendations:   Insulin-Basal: Please consider ordering Lantus 5 units daily.  Insulin-Correction: Please consider ordering Novolog 0-5 units QHS for bedtime correction.  Thanks, Barnie Alderman, RN, MSN, CDE Diabetes Coordinator Inpatient Diabetes Program [phone] (Team Pager from 8am to 5pm)

## 2019-04-04 NOTE — Plan of Care (Signed)
  Problem: Education: Goal: Knowledge of General Education information will improve Description: Including pain rating scale, medication(s)/side effects and non-pharmacologic comfort measures Outcome: Progressing   Problem: Health Behavior/Discharge Planning: Goal: Ability to manage health-related needs will improve Outcome: Progressing   Problem: Clinical Measurements: Goal: Ability to maintain clinical measurements within normal limits will improve Outcome: Progressing Goal: Will remain free from infection Outcome: Progressing Goal: Respiratory complications will improve Outcome: Progressing   Problem: Nutrition: Goal: Adequate nutrition will be maintained Outcome: Progressing   Problem: Coping: Goal: Level of anxiety will decrease Outcome: Progressing

## 2019-04-04 NOTE — Progress Notes (Signed)
ANTICOAGULATION CONSULT NOTE - Follow Up Consult  Pharmacy Consult for heparin Indication: CAD awaiting possible PCI  Labs: Recent Labs    04/02/19 0227 04/03/19 0226 04/03/19 1946 04/04/19 0231  HGB 11.4* 10.7* 11.4*  --   HCT 35.7* 33.6* 35.0*  --   PLT 173 168 157  --   HEPARINUNFRC 0.43 0.39  --  0.23*  CREATININE 1.65*  --  2.29* 2.13*    Assessment: 73yo female subtherapeutic on heparin after several levels at goal though had been trending down; no gtt issues or signs of bleeding per RN.  Goal of Therapy:  Heparin level 0.3-0.7 units/ml   Plan:  Will increase heparin gtt by 2 units/kg/hr to 1100 units/hr and check level in 8 hours.    Wynona Neat, PharmD, BCPS  04/04/2019,3:44 AM

## 2019-04-04 NOTE — Care Management Important Message (Signed)
Important Message  Patient Details  Name: Lori Rivera MRN: [id number] Date of Birth: 09-Mar-1946   Medicare Important Message Given:        Memory Argue 04/04/2019, 3:06 PM

## 2019-04-05 DIAGNOSIS — I2510 Atherosclerotic heart disease of native coronary artery without angina pectoris: Secondary | ICD-10-CM

## 2019-04-05 DIAGNOSIS — I200 Unstable angina: Secondary | ICD-10-CM

## 2019-04-05 DIAGNOSIS — Z95810 Presence of automatic (implantable) cardiac defibrillator: Secondary | ICD-10-CM

## 2019-04-05 DIAGNOSIS — N183 Chronic kidney disease, stage 3 (moderate): Secondary | ICD-10-CM

## 2019-04-05 DIAGNOSIS — E119 Type 2 diabetes mellitus without complications: Secondary | ICD-10-CM

## 2019-04-05 DIAGNOSIS — I5042 Chronic combined systolic (congestive) and diastolic (congestive) heart failure: Secondary | ICD-10-CM

## 2019-04-05 LAB — CBC
HCT: 38.5 % (ref 36.0–46.0)
Hemoglobin: 12.3 g/dL (ref 12.0–15.0)
MCH: 29.6 pg (ref 26.0–34.0)
MCHC: 31.9 g/dL (ref 30.0–36.0)
MCV: 92.8 fL (ref 80.0–100.0)
Platelets: 168 10*3/uL (ref 150–400)
RBC: 4.15 MIL/uL (ref 3.87–5.11)
RDW: 12.9 % (ref 11.5–15.5)
WBC: 9.2 10*3/uL (ref 4.0–10.5)
nRBC: 0 % (ref 0.0–0.2)

## 2019-04-05 LAB — BASIC METABOLIC PANEL
Anion gap: 10 (ref 5–15)
BUN: 32 mg/dL — ABNORMAL HIGH (ref 8–23)
CO2: 21 mmol/L — ABNORMAL LOW (ref 22–32)
Calcium: 8.5 mg/dL — ABNORMAL LOW (ref 8.9–10.3)
Chloride: 106 mmol/L (ref 98–111)
Creatinine, Ser: 1.63 mg/dL — ABNORMAL HIGH (ref 0.44–1.00)
GFR calc Af Amer: 35 mL/min — ABNORMAL LOW (ref >60)
GFR calc non Af Amer: 31 mL/min — ABNORMAL LOW (ref >60)
Glucose, Bld: 195 mg/dL — ABNORMAL HIGH (ref 70–99)
Potassium: 4.5 mmol/L (ref 3.5–5.1)
Sodium: 137 mmol/L (ref 135–145)

## 2019-04-05 LAB — GLUCOSE, CAPILLARY
Glucose-Capillary: 183 mg/dL — ABNORMAL HIGH (ref 70–99)
Glucose-Capillary: 216 mg/dL — ABNORMAL HIGH (ref 70–99)
Glucose-Capillary: 221 mg/dL — ABNORMAL HIGH (ref 70–99)

## 2019-04-05 LAB — HEPARIN LEVEL (UNFRACTIONATED): Heparin Unfractionated: 0.83 IU/mL — ABNORMAL HIGH (ref 0.30–0.70)

## 2019-04-05 MED ORDER — CLOPIDOGREL BISULFATE 75 MG PO TABS
75.0000 mg | ORAL_TABLET | Freq: Every day | ORAL | Status: DC
  Administered 2019-04-05: 15:00:00 75 mg via ORAL
  Filled 2019-04-05: qty 1

## 2019-04-05 MED ORDER — CLOPIDOGREL BISULFATE 75 MG PO TABS: 75.0000 mg | ORAL_TABLET | Freq: Every day | ORAL | 3 refills | Status: DC

## 2019-04-05 NOTE — Discharge Summary (Addendum)
Discharge Summary    Patient ID: Lori Rivera MRN: [id number]; DOB: Dec 23, 1945  Admit date: 03/31/2019 Discharge date: 04/05/2019  Primary Care Provider: Garwin Brothers, MD  Primary Cardiologist: Shirlee More, MD  Primary Electrophysiologist:  Constance Haw, MD   Discharge Diagnoses    Active Problems:   CAD in native artery   Chest pain   Chronic combined systolic and diastolic heart failure (HCC)   CKD (chronic kidney disease) stage 3, GFR 30-59 ml/min ([place])   Hyperlipidemia   Ischemic cardiomyopathy   Type 2 diabetes mellitus, without long-term current use of insulin ([place])   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 Yes Did it involve sudden or severe rash/hives, skin peeling, or any reaction on the inside of your mouth or nose? No Did you need to seek medical attention at a hospital or doctor's office? Yes When did it last happen?20 yrs ago If all above answers are NO, may proceed with cephalosporin use.     Diagnostic Studies/Procedures    LEFT HEART CATH AND CORONARY ANGIOGRAPHY 03/31/2019  Conclusion   Severe three-vessel coronary disease including borderline significant left main disease.  The right coronary is heavily stented from proximal to distal and is totally occluded proximally.  The right coronary fills by left-to-right collaterals.  The left main dampens with engagement using a 5 French catheter.  There is 40 to 50% ostial to mid left main narrowing.  The LAD is heavily calcified and stented from proximal to distal.  At least 3 stents are noted.  There is diffuse in-stent restenosis in the proximal LAD with up to 50%  narrowing.  Just distal to the stent there is eccentric 85 to 90% stenosis.  An adjacent diagonal contains proximal diffuse 80 to 90% stenosis forming a [place] 011 bifurcation stenosis.  There is distal LAD disease beyond the third stent.  The circumflex is relatively small.  The first obtuse marginal/ramus contains segmental 95% stenosis from ostial to proximal and apparently has previously undergone attempted PCI and was labeled "non-dilatable".  The circumflex then becomes very small after the first marginal giving to small to moderate sized obtuse marginal branches with a 70% stenosis proximal to the third obtuse marginal.  Left ventricular systolic dysfunction with EF 35 to 40% with regional wall motion abnormality involving the anterior wall and inferior wall.  LVEDP is 21 mmHg consistent with chronic combined systolic and diastolic heart failure.  RECOMMENDATIONS:   Heart team approach: Need to determine surgical versus interventional versus medical therapy.  After completing the procedure and speaking to family members, apparently the patient has had altered cognitive function/interaction since her myocardial infarction.  She suffered cardiac arrest and had 12 minutes of CPR according to the daughter.  After discussion, perhaps PCI will be her revascularization approach but will be high risk.  At this point it appears that treating the LAD diagonal or simply the LAD is technically possible although with heavy calcification and prior history of non-dilatable lesion, perhaps an atheroablative technique should be considered.  Recommendations   Recommend Aspirin 81mg  daily for moderate CAD.   Diagnostic Dominance: Right   _____________  Echocardiogram 04/01/2019 IMPRESSIONS  1. The left ventricle has moderately reduced systolic function, with an ejection fraction of 35-40%. The cavity size was normal. Left ventricular diastolic Doppler parameters are consistent with impaired  relaxation.  2. The right ventricle has normal systolic function. The cavity was normal. There is no increase in right ventricular wall thickness.  3. The mitral valve is abnormal. Moderate thickening of the mitral valve leaflet. Moderate calcification of the mitral valve leaflet.  4. The tricuspid valve is grossly normal.  5. The aortic valve is abnormal. Moderate thickening of the aortic valve. Moderate calcification of the aortic valve. Aortic valve regurgitation is mild by color flow Doppler. Mild stenosis of the aortic valve.  6. The aorta is normal unless otherwise noted.  7. The aortic root and ascending aorta are normal in size and structure.  8. The atrial septum is grossly normal.    History of Present Illness     Lori Rivera is a 73 y.o. female with a history of CAD s/p multiple PCIs, ischemic cardiomyopathy with ICD in place (Biotronik 6387), chronic systolic heart failure EF 40-45%, HTN, HLD, DM, palpitations with ST/AT on device, and family history of heart disease.  Lori Rivera has a history of CAD with cardiac arrest in 2016 90% occluded proximal RCA treated with stent.  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 She follows with Dr. Bettina Gavia. Last heart cath 02/23/18 with unsuccessful PCI of proximal ramus artery due to non-dilatable lesion. 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  Medical therapy was recommended as she was felt to not be a good candidate for CABG (turned down by [facility]).  She has ischemic cardiomyopathy with biotronik ICD in place. She was recently seen by EP [facility]) on 01/13/19 for inappropriate therapy with shocks. Determined she had appropriate therapy for VT 170-200s. BB ws increased.  Echo 04/05/14 with EF of 40-45%, mild aortic regurgitation, and mild to moderate MR.  She presented to [facility] with chest pain after taking 4 SL nitro tablets. EMS provided a 5th nitro and zofran  in route with resolution of her CP. At [facility], troponin increased to 0.56 and she was placed on heparin. Decision was made to transfer to [facility] for cardiac catheterization. Negative COVID test at [facility].  On arrival, she was on heparin drip and chest pain free. She stated her chest pain felt like it did in 2015/2016. It was described as a pressure in the center of her chest that started at approximately midnight. She was resting, not asleep, at the time. She took nitro tablets with relief that only lasted for approximately 5-15 min. She took a total of 4 nitro SL tablets and then called EMS when the chest pain returned. In the ED she was chest pain free. She denied N/V, SOB, orthopnea, but did report lower extremity swelling.    Hospital Course     Consultants: None  High-sensitivity troponin at [facility] was 330.  BNP 332.  She was treated for non-ST elevated myocardial infarction.  She underwent cardiac catheterization on 03/31/2019 with finding of severe three-vessel disease. (see above report) The patient and her daughter declined CABG.  She was continued on aspirin, statin, beta-blocker. Will add Plavix for 1 year due to NSTEMI.  Echocardiogram showed EF down to 35-40%, impaired relaxation, moderately calcified aortic valve with mild aortic stenosis and mild aortic regurgitation. No ARB currently due to renal function. Could consider as outpatient.  The case was discussed with interventional cardiology, Dr. Tamala Julian.  He noted that she has total occlusion of a heavily stented RCA, 40-50% left main, 85% proximal and mid LAD, high-grade obstruction in diagonals, 75% mid circumflex and 90% ramus.  The patient developed acute kidney injury with only small amount of contrast, increasing creatinine greater than 2.2.  Creatinine has improved now to 1.65.    Interventional team review: ([place], Monika Salk) recommend medical therapy indefinitely to control symptoms. Nitroglycerin is her  friend. If angina is not controllable, the LAD could be treated but is high risk given decreased LV function, right coronary dependence on collaterals from the left coronary, heavy calcification requiring atheroablation, and restenting a relatively small diameter artery which would increase the risk of repeat revascularization( i.e., in-stent restenosis/stent thrombosis).  In setting of recurrent ACS and / or refractory angina, the benefit of the high risk procedure would likely outweigh the risk.  Currently, the risk seems to be greater than the benefit as she is asymptomatic for the last several days and kidney function not yet stable.  The above was discussed extensively with the patient and she is agreeable to medical therapy if her symptoms are controllable. She will be discharged on medical therapy.   Acute kidney injury has improved.  Creatinine currently 1.6.  Peak 2.3.  1.3 at [place].  Was 1.45 in June 2020. Will resume home lasix at discharge.   Ischemic cardiomyopathy EF 35-40 percent - Biotronik ICD in place. Followed by Dr. Curt Bears.   Atrial pacing noted.  Beta-blocker was increased back in June when she was having shock therapy with VT zone 1 7200.   Pt walked with cardiac rehab and her walker and feels adequate for discharge. She lives alone and her daughter lives next door.   Patient has been seen by Dr. Marlou Porch today and deemed ready for discharge home. All follow up appointments have been scheduled. Discharge medications are listed below.   _____________  Discharge Vitals Blood pressure (!) 122/53, pulse 73, temperature 98.2 F (36.8 C), temperature source Oral, resp. rate 20, height 5\' 9"  (1.753 m), weight 98.6 kg, SpO2 98 %.  Filed Weights   03/31/19 0500 04/04/19 0100 04/05/19 0300  Weight: 93.8 kg 98.6 kg 98.6 kg    Labs & Radiologic Studies    CBC Recent Labs    04/03/19 1946 04/05/19 0632  WBC 7.1 9.2  HGB 11.4* 12.3  HCT 35.0* 38.5  MCV 93.1 92.8  PLT 157  161   Basic Metabolic Panel Recent Labs    04/04/19 0231 04/05/19 0632  NA 137 137  K 4.7 4.5  CL 106 106  CO2 24 21*  GLUCOSE 239* 195*  BUN 35* 32*  CREATININE 2.13* 1.63*  CALCIUM 8.4* 8.5*   Liver Function Tests No results for input(s): AST, ALT, ALKPHOS, BILITOT, PROT, ALBUMIN in the last 72 hours. No results for input(s): LIPASE, AMYLASE in the last 72 hours. High Sensitivity Troponin:   Recent Labs  Lab 03/31/19 0750  TROPONINIHS 330*    BNP Invalid input(s): POCBNP D-Dimer No results for input(s): DDIMER in the last 72 hours. Hemoglobin A1C No results for input(s): HGBA1C in the last 72 hours. Fasting Lipid Panel No results for input(s): CHOL, HDL, LDLCALC, TRIG, CHOLHDL, LDLDIRECT in the last 72 hours. Thyroid Function Tests No results for input(s): TSH, T4TOTAL, T3FREE, THYROIDAB in the last 72 hours.  Invalid input(s): FREET3 _____________  No results found. Disposition   Pt is being discharged home  today in good condition.  Follow-up Plans & Appointments    Follow-up Information    [name], MD Follow up.   Specialty: Cardiology Why: Cardiology hospital follow-up on Thursday, 04/07/2019 at 2:35 PM.  Please arrive 15 minutes early for check-in. Contact information: [place] [address] [phone]          Discharge Instructions    Amb Referral to Cardiac Rehabilitation   Complete by: As directed    Will send CRP II referral to [place]   Diagnosis: NSTEMI   After initial evaluation and assessments completed: Virtual Based Care may be provided alone or in conjunction with Phase 2 Cardiac Rehab based on patient barriers.: Yes   Diet - low sodium heart healthy   Complete by: As directed    Discharge instructions   Complete by: As directed    PLEASE REMEMBER TO BRING ALL OF [place].  PLEASE ATTEND ALL SCHEDULED FOLLOW-UP APPOINTMENTS.   Activity: Increase activity  slowly as tolerated. You may shower, but no soaking baths (or swimming) for 1 week. No driving for 24 hours. No lifting over 5 lbs for 1 week. No sexual activity for 1 week.   You May Return to Work: in 1 week (if applicable)  Wound Care: You may wash cath site gently with soap and water. Keep cath site clean and dry. If you notice pain, swelling, bleeding or pus at your cath site, please call [phone].   Increase activity slowly   Complete by: As directed       Discharge Medications   Allergies as of 04/05/2019      Reactions   Codeine Other (See Comments)   confused   Penicillins Rash   Did it involve swelling of the face/tongue/throat, SOB, or low BP? Yes Did it involve sudden or severe rash/hives, skin peeling, or any reaction on the inside of your mouth or nose? No Did you need to seek medical attention at a hospital or doctor's office? Yes When did it last happen?20 yrs ago If all above answers are NO, may proceed with cephalosporin use.      Medication List    TAKE these medications   aspirin EC 81 MG tablet Take 81 mg by mouth daily.   atorvastatin 20 MG tablet Commonly known as: LIPITOR Take 20 mg by mouth daily at 6 PM.   clopidogrel 75 MG tablet Commonly known as: PLAVIX Take 1 tablet (75 mg total) by mouth daily.   furosemide 40 MG tablet Commonly known as: LASIX Take 40 mg by mouth daily.   Jardiance 10 MG Tabs tablet Generic drug: empagliflozin Take 10 mg by mouth daily.   metoprolol tartrate 50 MG tablet Commonly known as: LOPRESSOR Take 1 tablet (50 mg total) by mouth 3 (three) times daily. What changed: when to take this   nitroGLYCERIN 0.4 MG SL tablet Commonly known as: NITROSTAT Place 0.4 mg under the tongue every 5 (five) minutes as needed.   potassium chloride 10 MEQ CR capsule Commonly known as: MICRO-K Take 10 mEq by mouth daily.   Tyler Aas FlexTouch 100 UNIT/ML Sopn FlexTouch Pen Generic drug: insulin degludec Inject 10  Units into the skin daily.        Acute coronary syndrome (MI, NSTEMI, STEMI, etc) this admission?: Yes.     AHA/ACC Clinical Performance & Quality Measures: 1. Aspirin prescribed? - Yes 2. ADP Receptor Inhibitor (Plavix/Clopidogrel, Brilinta/Ticagrelor or Effient/Prasugrel) prescribed (includes medically managed patients)? -  Yes 3. Beta Blocker prescribed? - Yes 4. High Intensity Statin (Lipitor 40-80mg  or Crestor 20-40mg ) prescribed? - Yes 5. EF assessed during THIS hospitalization? - Yes 6. For EF <40%, was ACEI/ARB prescribed? - No - Reason:  renal function 7. For EF <40%, Aldosterone Antagonist (Spironolactone or Eplerenone) prescribed? - No - Reason:  renal function 8. Cardiac Rehab Phase II ordered (Included Medically managed Patients)? - Yes     Outstanding Labs/Studies   Follow renal function  Duration of Discharge Encounter   Greater than 30 minutes including physician time.  Signed, Daune Perch, NP 04/05/2019, 2:41 PM  Personally seen and examined. Agree with above.  Appreciate Dr. Thompson Caul and interventional team involvement.  At this time, we will continue with aggressive medical management and stabilization.  If anginal symptoms do develop, we will then need to consider high risk percutaneous intervention on LAD. Overall, she is very comfortable, no chest discomfort no anginal symptoms, able to ambulate without difficulty.  Eager to go home.  Candee Furbish, MD

## 2019-04-05 NOTE — Progress Notes (Signed)
INTERVENTIONAL CARDIOLOGY FOLLOW-UP   I performed coronary angiography on the patient last week.  Her anatomy has been fully outlined.  She has total occlusion of a heavily stented RCA 40 to 50% left main, 85% proximal and mid LAD, high-grade obstruction in diagonals, 75% mid circumflex and 90% ramus.  EF is 35 to 40%.  Post cath where less than 100 cc of contrast was administered she developed acute on chronic kidney injury with creatinine increasing greater than 2.2.  Creatinine is now settling back down and is 1.65 today.  Interventional team review: ([place], Monika Salk) recommend medical therapy indefinitely to control symptoms. Nitroglycerin is her friend. If angina is not controllable, the LAD could be treated but is high risk given decreased LV function, right coronary dependence on collaterals from the left coronary, heavy calcification requiring atheroablation, and restenting a relatively small diameter artery which would increase the risk of repeat revascularization( i.e., in-stent restenosis/stent thrombosis).  In setting of recurrent ACS and / or refractory angina, the benefit of the high risk procedure would likely outweigh the risk.  Currently, the risk seems to be greater than the benefit as she is asymptomatic for the last several days and kidney function not yet stable.  I discussed this approach extensively with the patient and she is agreeable to medical therapy if her symptoms are controllable.

## 2019-04-05 NOTE — Progress Notes (Signed)
Inpatient Diabetes Program Recommendations  AACE/ADA: New Consensus Statement on Inpatient Glycemic Control   Target Ranges:  Prepandial:   less than 140 mg/dL      Peak postprandial:   less than 180 mg/dL (1-2 hours)      Critically ill patients:  140 - 180 mg/dL   Review of Glycemic Control Results for Lori Rivera, Lori Rivera (MRN [id number]) as of 04/05/2019 10:07  Ref. Range 04/04/2019 06:19 04/04/2019 08:50 04/04/2019 12:07 04/04/2019 16:26 04/04/2019 21:21 04/05/2019 06:31 04/05/2019 08:41  Glucose-Capillary Latest Ref Range: 70 - 99 mg/dL 226 (H) 191 (H) 163 (H) 169 (H) 188 (H) 183 (H) 216 (H)   Diabetes history: DM2 Outpatient Diabetes medications: Tresiba 10 units daily, Jardiance 10 mg daily Current orders for Inpatient glycemic control: Novolog 0-15 units TID with meals  Inpatient Diabetes Program Recommendations:   Fasting glucose in the 200's. Please consider ordering half of patient's basal insulin, Lantus 5 units daily.  Thanks, Tama Headings RN, MSN, BC-ADM Inpatient Diabetes Coordinator Team Pager [phone] (8a-5p)

## 2019-04-05 NOTE — Progress Notes (Signed)
ANTICOAGULATION CONSULT NOTE - Follow Up Consult  Pharmacy Consult for heparin Indication: CAD awaiting possible PCI  Labs: Recent Labs    04/03/19 0226 04/03/19 1946 04/04/19 0231 04/04/19 1251 04/04/19 2201 04/05/19 8469 04/05/19 0653  HGB 10.7* 11.4*  --   --   --  12.3  --   HCT 33.6* 35.0*  --   --   --  38.5  --   PLT 168 157  --   --   --  168  --   HEPARINUNFRC 0.39  --  0.23* 0.20* 0.95*  --  0.83*  CREATININE  --  2.29* 2.13*  --   --  1.63*  --     Assessment: 73yo female s/p cath with 3VCAD and plans for PCI vs medical management. Pharmacy dosing heparin. -heparin level= 0.83   Goal of Therapy:  Heparin level 0.3-0.7 units/ml   Plan:  Decrease heparin to 1050 units/hr Heparin level in 8 hours and daily wth CBC daily  Hildred Laser, PharmD Clinical Pharmacist **Pharmacist phone directory can now be found on [email or web address] ([email or web address]).  Listed under [place].

## 2019-04-05 NOTE — TOC Initial Note (Addendum)
Transition of Care [facility]) - Initial/Assessment Note    Patient Details  Name: Lori Rivera MRN: [id number] Date of Birth: 07-23-1946  Transition [facility]) CM/SW Contact:    Alberteen Sam, Delia Phone Number: [phone] 04/05/2019, 2:58 PM  Clinical Narrative:                  CSW met with patient at bedside to review discharge plan, patient reports in the past she has home health services but does not remember who that was through. She requested CSW contact her daughter Joelene Millin. CSW contacted Joelene Millin who reports patient had [place] PT and OT at the first of the year, however does not have that anymore. She reports they private pay a nurse that comes to their home and they have a cleaning/cooking lady that comes Monday and Friday. CSW inquired of preference for home health PT and OT, she reports none at this time. Address confirmed for [place] location in chart.   CSW reached out to [facility], they are able to provide patient with Home health PT and OT.   Patient's daughter reports she will pick patient up at time of discharge. No equipment needs identified by daughter. No further needs at this time.   Expected Discharge Plan: [place] Barriers to Discharge: No Barriers Identified   Patient Goals and CMS Choice   CMS Medicare.gov Compare Post Acute Care list provided to:: Patient Represenative (must comment)(kim) Choice offered to / list presented to : Adult Children(daughter Maudie Mercury)  Expected Discharge Plan and Services Expected Discharge Plan: [place] Choice: [place] arrangements for the past 2 months: Single Family Home Expected Discharge Date: 04/05/19                         HH Arranged: PT, OT [place] Agency: [facility] (formerly [employer]) Date [place]: 04/05/19 Time [place]: [place] Representative spoke with at [place]: [place]  Arrangements/Services Living arrangements for the past 2 months: [place] Lives with:: Self Patient language and need for interpreter reviewed:: Yes Do you feel safe going back to the place where you live?: Yes      Need for Family Participation in Patient Care: Yes (Comment) Care giver support system in place?: Yes (comment) Current home services: Home RN, Housekeeping Criminal Activity/Legal Involvement Pertinent to Current Situation/Hospitalization: No - Comment as needed  Activities of Daily Living Home Assistive Devices/Equipment: Bedside commode/3-in-1, Cane (specify quad or straight), Dentures (specify type), Eyeglasses, CBG Meter, Shower chair with back, Walker (specify type) ADL Screening (condition at time of admission) Patient's cognitive ability adequate to safely complete daily activities?: Yes Is the patient deaf or have difficulty hearing?: No Does the patient have difficulty seeing, even when wearing glasses/contacts?: No Does the patient have difficulty concentrating, remembering, or making decisions?: No Patient able to express need for assistance with ADLs?: Yes Does the patient have difficulty dressing or bathing?: No Independently performs ADLs?: No Communication: Independent Dressing (OT): Independent Grooming: Independent Feeding: Independent Bathing: Needs assistance Is this a change from baseline?: Pre-admission baseline Toileting: Independent In/Out Bed: Independent Walks in Home: Independent Does the patient have difficulty walking or climbing stairs?: Yes Weakness of Legs: Both Weakness of Arms/Hands: None  Permission Sought/Granted Permission sought to share information with : Case Freight forwarder, Customer service manager, Family Supports Permission granted to share  information with : Yes, Verbal Permission Granted  Share Information with NAME: Maudie Mercury  Permission granted to share info w AGENCY: [place] granted to share info w  Relationship: daughter  Permission granted to share info w Contact Information: [phone]  Emotional Assessment Appearance:: Appears stated age Attitude/Demeanor/Rapport: Gracious Affect (typically observed): Calm Orientation: : Oriented to Self, Oriented to Place, Oriented to  Time, Oriented to Situation Alcohol / Substance Use: Not Applicable Psych Involvement: No (comment)  Admission diagnosis:  N-STEMI  Patient Active Problem List   Diagnosis Date Noted  . Unstable angina ([place]) 03/31/2019  . Pressure injury of skin 03/31/2019  . Palpitations 01/17/2019  . PAT (paroxysmal atrial tachycardia) ([place]) 01/17/2019  . Acute renal failure superimposed on stage 3 chronic kidney disease ([place]) 11/27/2017  . Arthritis 09/24/2017  . CHF (congestive heart failure) ([place]) 09/24/2017  . Shortness of breath 09/24/2017  . Aortic regurgitation 09/24/2017  . Vitamin B 12 deficiency 09/24/2017  . Vitamin D deficiency 09/24/2017  . Essential hypertension 06/29/2017  . Coronary artery disease involving native heart with angina pectoris ([place]) 06/29/2017  . 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 Ischemic cardiomyopathy 11/18/2015  . Chronic combined systolic and diastolic heart failure ([place]) 08/19/2015  . Pneumonia due to infectious organism 08/19/2015  . Chest pain 08/08/2015  . PNA (pneumonia) 08/08/2015  . ST elevation myocardial infarction involving right coronary artery ([place]) 08/03/2015  . STEMI (ST elevation myocardial infarction) ([place]) 08/03/2015  . Chronic systolic heart failure ([place]) 06/07/2015  . CKD (chronic kidney disease) stage 3, GFR 30-59 ml/min (HCC) 06/07/2015  . NSTEMI (non-ST elevated myocardial infarction) ([place]) 05/25/2015  . CAD in native artery 03/07/2015  . Hyperlipidemia 03/07/2015  . 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 Type 2 diabetes mellitus, without long-term current use of insulin  ([place]) 03/07/2015   PCP:  Garwin Brothers, MD Pharmacy:   [id number] [id number]: [phone]: [phone]    Social Determinants of Health (SDOH) Interventions    Readmission Risk Interventions No flowsheet data found.

## 2019-04-05 NOTE — Progress Notes (Signed)
Discharged home by wheelchair accompanied by daughter, discharge instructions given to pt. and daughter. Belongings taken home.

## 2019-04-05 NOTE — Discharge Instructions (Signed)
Lifestyle Modifications to Prevent and Treat Heart Disease -Recommend heart healthy/Mediterranean diet, with whole grains, fruits, vegetables, fish, lean meats, nuts, olive oil and avocado oil.  -Limit salt intake to less than 1500 mg per day.  -Recommend moderate walking, starting slowly and building up over time.  -Recommend avoidance of tobacco products. Avoid excess alcohol. -Keep blood pressure well controlled, ideally less than 130/80.

## 2019-04-05 NOTE — Progress Notes (Signed)
Daughter Joelene Millin made aware about the discharge home, claimed to come and pick-up pt . after at 6pm. Pt. made aware.

## 2019-04-05 NOTE — Progress Notes (Signed)
Progress Note  Patient Name: Lori Rivera Date of Encounter: 04/05/2019  Primary Cardiologist: Shirlee More, MD   Subjective   Currently feeling comfortable in bed laying flat, no chest pain.  Pleasant.  Yesterday discussed with interventional team.  Discussed again today with Dr. Tamala Julian.  Inpatient Medications    Scheduled Meds: . aspirin EC  81 mg Oral Daily  . atorvastatin  80 mg Oral q1800  . insulin aspart  0-15 Units Subcutaneous TID WC  . metoprolol tartrate  50 mg Oral TID  . pneumococcal 23 valent vaccine  0.5 mL Intramuscular Tomorrow-1000  . sodium chloride flush  3 mL Intravenous Q12H   Continuous Infusions: . sodium chloride    . heparin 1,050 Units/hr (04/05/19 0756)   PRN Meds: sodium chloride, acetaminophen, nitroGLYCERIN, ondansetron (ZOFRAN) IV, sodium chloride flush   Vital Signs    Vitals:   04/04/19 1926 04/04/19 2300 04/05/19 0300 04/05/19 0843  BP: (!) 122/58 118/71 (!) 129/45 (!) 115/51  Pulse: 69 71 78 72  Resp: 20 (!) 23 18 20   Temp: 98.1 F (36.7 C) 97.8 F (36.6 C) 98.4 F (36.9 C) 98.2 F (36.8 C)  TempSrc: Oral Oral Oral Oral  SpO2: 100% 97% 96% 100%  Weight:   98.6 kg   Height:        Intake/Output Summary (Last 24 hours) at 04/05/2019 0937 Last data filed at 04/05/2019 0900 Gross per 24 hour  Intake 543 ml  Output 201 ml  Net 342 ml   Last 3 Weights 04/05/2019 04/04/2019 03/31/2019  Weight (lbs) 217 lb 6 oz 217 lb 6 oz 206 lb 12.7 oz  Weight (kg) 98.6 kg 98.6 kg 93.8 kg      Telemetry    Atrial pacing- Personally Reviewed  ECG    Atrial pacing- Personally Reviewed  Physical Exam   GEN: No acute distress.   Neck: No JVD Cardiac: RRR, no murmurs, rubs, or gallops.  Respiratory: Clear to auscultation bilaterally. GI: Soft, nontender, non-distended  MS: No edema; No deformity. Neuro:  Nonfocal  Psych: Normal affect   Labs    High Sensitivity Troponin:   Recent Labs  Lab 03/31/19 0750  TROPONINIHS 330*       Chemistry Recent Labs  Lab 04/03/19 1946 04/04/19 0231 04/05/19 0632  NA 134* 137 137  K 4.8 4.7 4.5  CL 104 106 106  CO2 21* 24 21*  GLUCOSE 210* 239* 195*  BUN 33* 35* 32*  CREATININE 2.29* 2.13* 1.63*  CALCIUM 8.4* 8.4* 8.5*  GFRNONAA 20* 22* 31*  GFRAA 23* 26* 35*  ANIONGAP 9 7 10      Hematology Recent Labs  Lab 04/03/19 0226 04/03/19 1946 04/05/19 0632  WBC 7.2 7.1 9.2  RBC 3.61* 3.76* 4.15  HGB 10.7* 11.4* 12.3  HCT 33.6* 35.0* 38.5  MCV 93.1 93.1 92.8  MCH 29.6 30.3 29.6  MCHC 31.8 32.6 31.9  RDW 12.7 12.7 12.9  PLT 168 157 168    BNP Recent Labs  Lab 04/03/19 2240  BNP 332.0*     DDimer No results for input(s): DDIMER in the last 168 hours.   Radiology    No results found.  Cardiac Studies   Cardiac catheterization - Severe three-vessel disease.  Daughter and she have declined CABG.  Further discussion with interventional team. -After discussion, perhaps PCI will be her revascularization approach but will be high risk. At this point it appears that treating the LAD diagonal or simply the LAD is technically  possible although with heavy calcification and prior history of non-dilatable lesion, perhaps an atheroablative technique should be considered.  Diagnostic Dominance: Right    Patient Profile     73 y.o. female with severe multivessel coronary artery disease, non-STEMI, ischemic cardiomyopathy  Assessment & Plan    Non-ST elevation myocardial infarction with severe multivessel coronary artery disease - But she and her daughter did not wish to proceed with CABG after lengthy discussion. - I have been in talks with the interventional team, Dr. Tamala Julian today to discuss potential options such as discharge with planned atherectomy high risk PCI or potentially intervention later today. - Acute kidney injury has improved.  Creatinine currently 1.6.  Peak 2.3.  1.3 at [place].  Was 1.45 in June 2020. - Currently on continued IV heparin drip.   Chest pain-free.  Ischemic cardiomyopathy EF 35-40 percent - Biotronik ICD in place.  Atrial pacing noted.  Beta-blocker was increased back in June when she was having shock therapy with VT zone 1 7200.  Beta-blocker was increased.  Chronic kidney disease stage III - Creatinine improved.    For questions or updates, please contact [place] Please consult [email or web address] for contact info under        Signed, Candee Furbish, MD  04/05/2019, 9:37 AM

## 2019-04-05 NOTE — Evaluation (Signed)
Physical Therapy Evaluation Patient Details Name: Lori Rivera MRN: [id number] DOB: Sep 10, 1945 Today's Date: 04/05/2019   History of Present Illness  Lori Rivera is a 73 y.o. female with a history of CAD s/p multiple PCIs, ischemic cardiomyopathy with ICD in place (Biotronik 7591), chronic systolic heart failure EF 40-45%, HTN, HLD, DM, palpitations with ST/AT on device, and family history of heart disease.  Admitted with new NSTEMI.  Clinical Impression  Patient presents with mobility at S to min A level without device, discussed using a device (RW) at home for safety for next several days and for family to provide initial 24 hour assist during transition from hospital to home.  She is easily fatigued and demonstrated on LOB with turning without device.  Also discussed footwear and ways to conserve energy.  She has an aide Monday and Fridays for 3-4 hours and daughter assists with meals.  Feel she is stable for home with above outlined recommendations and with follow up HHPT.  Will sign off as planned d/c this pm.     Follow Up Recommendations Home health PT    Equipment Recommendations  None recommended by PT    Recommendations for Other Services       Precautions / Restrictions Precautions Precautions: Fall      Mobility  Bed Mobility Overal bed mobility: Modified Independent                Transfers Overall transfer level: Needs assistance Equipment used: None Transfers: Sit to/from Stand Sit to Stand: Min guard         General transfer comment: for stabilizing without device (pt wanted to try without device)  Ambulation/Gait Ambulation/Gait assistance: Supervision Gait Distance (Feet): 125 Feet Assistive device: None Gait Pattern/deviations: Step-through pattern;Step-to pattern;Shuffle;Trunk flexed     General Gait Details: no LOB in hallway, one LOB turning to sit on bed with min A for safety; discussed using walker at home at least  initially  Stairs            Wheelchair Mobility    Modified Rankin (Stroke Patients Only)       Balance Overall balance assessment: Needs assistance   Sitting balance-Leahy Scale: Good       Standing balance-Leahy Scale: Good                               Pertinent Vitals/Pain Pain Assessment: No/denies pain    Home Living Family/patient expects to be discharged to:: Private residence Living Arrangements: Alone Available Help at Discharge: Family;Available PRN/intermittently;Personal care attendant Type of Home: House Home Access: Ramped entrance     Home Layout: One level Home Equipment: Walker - 2 wheels;Cane - single point;Bedside commode      Prior Function Level of Independence: Needs assistance   Gait / Transfers Assistance Needed: indep with cane PTA  ADL's / Homemaking Assistance Needed: daughter assists with meals (pt heats them up) patient has help 2 days a week 7 hours via home instead to assist with shower, housekeeping, etc        Hand Dominance        Extremity/Trunk Assessment        Lower Extremity Assessment Lower Extremity Assessment: RLE deficits/detail;LLE deficits/detail RLE Deficits / Details: AROM WFL, strength hip flexion 3/5, knee extension 4/5 LLE Deficits / Details: AROM WFL, strength hip flexion 3/5, knee extension 4/5    Cervical / Trunk Assessment Cervical / Trunk Assessment:  Kyphotic  Communication   Communication: No difficulties  Cognition Arousal/Alertness: Awake/alert Behavior During Therapy: WFL for tasks assessed/performed Overall Cognitive Status: Within Functional Limits for tasks assessed                                 General Comments: a little slow, but likely baseline      General Comments General comments (skin integrity, edema, etc.): Has lifeline at home and encouraged to wear all the time, discussed footwear as likely to get winded donning socks and shoes and to  consider Sketcher's style slip ons or croc's and for her to have support at home first 24 hours she is home for safe transition.  Also relayed to SW to relay to daughter    Exercises     Assessment/Plan    PT Assessment All further PT needs can be met in the next venue of care  PT Problem List Decreased strength;Decreased activity tolerance;Decreased mobility;Decreased balance;Decreased knowledge of use of DME;Cardiopulmonary status limiting activity       PT Treatment Interventions      PT Goals (Current goals can be found in the Care Plan section)  Acute Rehab PT Goals PT Goal Formulation: All assessment and education complete, DC therapy    Frequency     Barriers to discharge        Co-evaluation               AM-PAC PT "6 Clicks" Mobility  Outcome Measure Help needed turning from your back to your side while in a flat bed without using bedrails?: None Help needed moving from lying on your back to sitting on the side of a flat bed without using bedrails?: None Help needed moving to and from a bed to a chair (including a wheelchair)?: A Little Help needed standing up from a chair using your arms (e.g., wheelchair or bedside chair)?: A Little Help needed to walk in hospital room?: A Little Help needed climbing 3-5 steps with a railing? : A Little 6 Click Score: 20    End of Session   Activity Tolerance: Patient limited by fatigue Patient left: in bed;with call bell/phone within reach   PT Visit Diagnosis: Muscle weakness (generalized) (M62.81);Other abnormalities of gait and mobility (R26.89)    Time: 1157-2620 PT Time Calculation (min) (ACUTE ONLY): 25 min   Charges:   PT Evaluation $PT Eval Moderate Complexity: 1 Mod PT Treatments $Gait Training: 8-22 mins        Lori Rivera, [place] Acute Rehabilitation Services [phone] 04/05/2019   Lori Rivera 04/05/2019, 4:27 PM

## 2019-04-05 NOTE — Progress Notes (Signed)
Pt. claimed that she been not walking  due to feeling weak, NP made aware with order for PT to evaluate.

## 2019-04-05 NOTE — Progress Notes (Signed)
CARDIAC REHAB PHASE I   PRE:  Rate/Rhythm: 38 SR  BP:  Sitting: 116/44      SaO2: 99 RA  MODE:  Ambulation: 240 ft   POST:  Rate/Rhythm: 90 SR  BP:  Sitting: 135/51    SaO2: 100 RA   Pt ambulated 250ft in hallway assist of 2 with gait belt and front wheel walker. Pt c/o fatigue and SOB after walk. Pt coached through purse lipped breathing. Encouraged pt to take rest breaks throughout the walk, pt "just wanted to get back to bed". Pt given MI book, heart healthy and diabetic diets. Reviewed restrictions and site care. Encouraged ambulation as able with an emphasis on safety. Pt admits to several falls after changing positions. Encouraged pt to change positions slowly and use walker when ambulating. Will refer to CRP II [place], pt may not be able to attend though.  [phone] Rufina Falco, RN BSN 04/05/2019 2:14 PM

## 2019-04-06 NOTE — Progress Notes (Deleted)
Cardiology Office Note:    Date:  04/06/2019   ID:  Lori Rivera, DOB Aug 08, 1946, MRN [id number]  PCP:  Garwin Brothers, MD  Cardiologist:  Shirlee More, MD    Referring MD: Garwin Brothers, MD    ASSESSMENT:    No diagnosis found. PLAN:    In order of problems listed above:  1. ***   Next appointment: ***   Medication Adjustments/Labs and Tests Ordered: Current medicines are reviewed at length with the patient today.  Concerns regarding medicines are outlined above.  No orders of the defined types were placed in this encounter.  No orders of the defined types were placed in this encounter.   No chief complaint on file.   History of Present Illness:    Lori Rivera is a 73 y.o. female with a hx of coronary artery disease multiple PCI's and  heart failure last EF 45% hypertension dyslipidemia and type 2 diabetes.  She also has a Biotronik ICD  She was last seen by me 01/17/2019 and discharged from [facility] 04/05/2019. She had a recent left heart catheterization showing multivessel CAD and was judged with a heart team approach to be best treated medically and a poor candidate for percutaneous or surgical revascularization.  Her ejection fraction was 35 to 40% and she had a transient decrease in renal function after contrast administration. Compliance with diet, lifestyle and medications: *** Past Medical History:  Diagnosis Date  . Aortic regurgitation 09/24/2017  . Arthritis 09/24/2017  . CAD in native artery 03/07/2015   Overview:  1. Out of hospital cardiac arrest Aug 2016 with MI, shock, PCI and 2 Xience DES to mid and distal culprit RCA with diffuse LAD disease and hypothermia protocol 2. NonSTEMI,05/27/15  Conclusions: 1. Pt was turned down by CTS due to co-morbidities. Pt is for PCI to multiple lesions on LAD and RCA. Ramus is small, will treat medically. 2. Successful PCI / Xience Drug Eluting Stent of the m  . Chest pain 08/08/2015  . CHF (congestive heart failure) ([place])  09/24/2017  . Chronic combined systolic and diastolic heart failure ([place]) 08/19/2015   Overview:  Echo 08/03/15: Left Ventricle Normal LV chamber size. Normal LV wall thickness. Mildly reduced overall LV systolic function. Estimated LVEF 45% Inferior, posterior and inferoseptal hypokinesis. Abnormal LV diastolic function, Grade 2, with echo evidence of elevated LA pressure.  Overview:  MUGA 20% 09/03/16 Overview:  Echo 08/03/15: Left Ventricle Normal LV chamber size. Normal LV wall   . Chronic systolic heart failure ([place]) 06/07/2015  . CKD (chronic kidney disease) stage 3, GFR 30-59 ml/min (HCC) 06/07/2015  . Coronary artery disease involving native heart with angina pectoris ([place]) 06/29/2017   Overview:  Added automatically from request for surgery [phone]  . 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 Essential hypertension 06/29/2017  . Hyperlipidemia 03/07/2015  . 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 Ischemic cardiomyopathy 11/18/2015   Overview:  EF 20-25%  . NSTEMI (non-ST elevated myocardial infarction) ([place]) 05/25/2015   Overview:  Cardiac cath 05/28/15 :Conclusions 1. Pt was turned down by CTS due to co-morbidities. Pt is for PCI to multiple lesions on LAD and RCA. Ramus is small, will treat medically. 2. Successful PCI / Xience Drug Eluting Stent of the mid and distal Left Anterior Descending Coronary Artery. 3. Successful PCI / Xience Drug Eluting Stent of the ostial and mid Right Coronary Artery. 4. POBA to  Di  . PNA (pneumonia) 08/08/2015  . Pneumonia due to infectious organism 08/19/2015   Overview:  Discharge summary 08/11/15: Her chest x-ray showed airspace opacities concerning for multifocal pneumonia and she will be discharged on oral Levaquin. Follow-up chest x-ray is recommended to ensure resolution.  . Shortness of breath 09/24/2017  . ST elevation myocardial infarction involving  right coronary artery ([place]) 08/03/2015   Overview:  Due to ISR and acute stent thrombosis Cath 08/03/15: Diagnostic procedure: Left Heart Cath PCI procedure: Stent DES, Thrombectomy Complications: No Complications. Conclusions Diagnostic Summary Acute occlusion of RCA ostial stent thrombosis Severe stenosis of the mid RCA, partially ISR Patent LAD stent CTO of small circ system Interventional Summary Successful Thrombectomy/PTCA of the o  . STEMI (ST elevation myocardial infarction) ([place]) 08/03/2015  . Type 2 diabetes mellitus, without long-term current use of insulin ([place]) 03/07/2015  . Vitamin B 12 deficiency 09/24/2017  . Vitamin D deficiency 09/24/2017    Past Surgical History:  Procedure Laterality Date  . C-spine surgery    . CATARACT EXTRACTION, BILATERAL    . CHOLECYSTECTOMY    . CORONARY ANGIOPLASTY WITH STENT PLACEMENT    . INSERT / REPLACE / [place]     ICD, Medtronic  . LEFT HEART CATH AND CORONARY ANGIOGRAPHY N/A 03/31/2019   Procedure: LEFT HEART CATH AND CORONARY ANGIOGRAPHY;  Surgeon: Belva Crome, MD;  Location: [place] [facility];  Service: Cardiovascular;  Laterality: N/A;  . LITHOTRIPSY    . URETEROSCOPY      Current Medications: No outpatient medications have been marked as taking for the 04/07/19 encounter (Appointment) with [name], MD.     Allergies:   Codeine and Penicillins   Social History   Socioeconomic History  . Marital status: Widowed    Spouse name: Not on file  . Number of children: Not on file  . Years of education: Not on file  . Highest education level: Not on file  Occupational History  . Not on file  Social Needs  . Financial resource strain: Not on file  . Food insecurity    Worry: Not on file    Inability: Not on file  . Transportation needs    Medical: Not on file    Non-medical: Not on file  Tobacco Use  . Smoking status: Former Smoker    Quit date: 06/07/1985    Years since quitting: 33.8  . Smokeless tobacco:  Never Used  Substance and Sexual Activity  . Alcohol use: No    Frequency: Never  . Drug use: No  . Sexual activity: Not on file  Lifestyle  . Physical activity    Days per week: Not on file    Minutes per session: Not on file  . Stress: Not on file  Relationships  . Social Herbalist on phone: Not on file    Gets together: Not on file    Attends religious service: Not on file    Active member of club or organization: Not on file    Attends meetings of clubs or organizations: Not on file    Relationship status: Not on file  Other Topics Concern  . Not on file  Social History Narrative  . Not on file     Family History: The patient's ***family history includes CAD in her brother and brother; Cancer in her father and sister; Diabetes in her mother; Hypertension in her mother; Stroke in her mother. ROS:   Please see  the history of present illness.    All other systems reviewed and are negative.  EKGs/Labs/Other Studies Reviewed:    The following studies were reviewed today:  EKG:  EKG ordered today and personally reviewed.  The ekg ordered today demonstrates ***  Recent Labs: 01/21/2019: ALT 11; TSH 2.030 04/03/2019: B Natriuretic Peptide 332.0 04/05/2019: BUN 32; Creatinine, Ser 1.63; Hemoglobin 12.3; Platelets 168; Potassium 4.5; Sodium 137  Recent Lipid Panel    Component Value Date/Time   CHOL 125 03/31/2019 0750   TRIG 66 03/31/2019 0750   HDL 42 03/31/2019 0750   CHOLHDL 3.0 03/31/2019 0750   VLDL 13 03/31/2019 0750   LDLCALC 70 03/31/2019 0750    Physical Exam:    VS:  There were no vitals taken for this visit.    Wt Readings from Last 3 Encounters:  04/05/19 217 lb 6 oz (98.6 kg)  01/17/19 192 lb 12.8 oz (87.5 kg)  10/13/18 195 lb (88.5 kg)     GEN: *** Well nourished, well developed in no acute distress HEENT: Normal NECK: No JVD; No carotid bruits LYMPHATICS: No lymphadenopathy CARDIAC: ***RRR, no murmurs, rubs, gallops RESPIRATORY:   Clear to auscultation without rales, wheezing or rhonchi  ABDOMEN: Soft, non-tender, non-distended MUSCULOSKELETAL:  No edema; No deformity  SKIN: Warm and dry NEUROLOGIC:  Alert and oriented x 3 PSYCHIATRIC:  Normal affect    Signed, Shirlee More, MD  04/06/2019 10:54 AM    [place]

## 2019-04-07 ENCOUNTER — Ambulatory Visit: Payer: Medicare Other | Admitting: Cardiology

## 2019-04-07 ENCOUNTER — Inpatient Hospital Stay (HOSPITAL_COMMUNITY)
Admission: AD | Admit: 2019-04-07 | Discharge: 2019-04-14 | DRG: 215 | Disposition: A | Discharge status: Home Health | Payer: Medicare Other | Source: Other Acute Inpatient Hospital | Attending: Internal Medicine | Admitting: Internal Medicine

## 2019-04-07 ENCOUNTER — Other Ambulatory Visit: Payer: Self-pay

## 2019-04-07 ENCOUNTER — Inpatient Hospital Stay (HOSPITAL_COMMUNITY): Payer: Medicare Other

## 2019-04-07 ENCOUNTER — Encounter (HOSPITAL_COMMUNITY): Payer: Self-pay | Admitting: *Deleted

## 2019-04-07 DIAGNOSIS — Z7902 Long term (current) use of antithrombotics/antiplatelets: Secondary | ICD-10-CM

## 2019-04-07 DIAGNOSIS — J810 Acute pulmonary edema: Secondary | ICD-10-CM | POA: Diagnosis present

## 2019-04-07 DIAGNOSIS — Z880 Allergy status to penicillin: Secondary | ICD-10-CM | POA: Diagnosis not present

## 2019-04-07 DIAGNOSIS — L89153 Pressure ulcer of sacral region, stage 3: Secondary | ICD-10-CM | POA: Diagnosis present

## 2019-04-07 DIAGNOSIS — Z885 Allergy status to narcotic agent status: Secondary | ICD-10-CM

## 2019-04-07 DIAGNOSIS — Z95810 Presence of automatic (implantable) cardiac defibrillator: Secondary | ICD-10-CM

## 2019-04-07 DIAGNOSIS — E1165 Type 2 diabetes mellitus with hyperglycemia: Secondary | ICD-10-CM | POA: Diagnosis present

## 2019-04-07 DIAGNOSIS — Z8249 Family history of ischemic heart disease and other diseases of the circulatory system: Secondary | ICD-10-CM

## 2019-04-07 DIAGNOSIS — E785 Hyperlipidemia, unspecified: Secondary | ICD-10-CM | POA: Diagnosis present

## 2019-04-07 DIAGNOSIS — Z809 Family history of malignant neoplasm, unspecified: Secondary | ICD-10-CM

## 2019-04-07 DIAGNOSIS — Z7982 Long term (current) use of aspirin: Secondary | ICD-10-CM | POA: Diagnosis not present

## 2019-04-07 DIAGNOSIS — N184 Chronic kidney disease, stage 4 (severe): Secondary | ICD-10-CM | POA: Diagnosis present

## 2019-04-07 DIAGNOSIS — Z20828 Contact with and (suspected) exposure to other viral communicable diseases: Secondary | ICD-10-CM | POA: Diagnosis present

## 2019-04-07 DIAGNOSIS — M1990 Unspecified osteoarthritis, unspecified site: Secondary | ICD-10-CM | POA: Diagnosis present

## 2019-04-07 DIAGNOSIS — I255 Ischemic cardiomyopathy: Secondary | ICD-10-CM | POA: Diagnosis present

## 2019-04-07 DIAGNOSIS — I200 Unstable angina: Secondary | ICD-10-CM

## 2019-04-07 DIAGNOSIS — I5023 Acute on chronic systolic (congestive) heart failure: Secondary | ICD-10-CM | POA: Diagnosis present

## 2019-04-07 DIAGNOSIS — I501 Left ventricular failure: Secondary | ICD-10-CM

## 2019-04-07 DIAGNOSIS — I2510 Atherosclerotic heart disease of native coronary artery without angina pectoris: Secondary | ICD-10-CM | POA: Diagnosis not present

## 2019-04-07 DIAGNOSIS — N183 Chronic kidney disease, stage 3 (moderate): Secondary | ICD-10-CM

## 2019-04-07 DIAGNOSIS — E872 Acidosis: Secondary | ICD-10-CM | POA: Diagnosis present

## 2019-04-07 DIAGNOSIS — J9601 Acute respiratory failure with hypoxia: Secondary | ICD-10-CM | POA: Diagnosis present

## 2019-04-07 DIAGNOSIS — Z79899 Other long term (current) drug therapy: Secondary | ICD-10-CM

## 2019-04-07 DIAGNOSIS — E1122 Type 2 diabetes mellitus with diabetic chronic kidney disease: Secondary | ICD-10-CM | POA: Diagnosis present

## 2019-04-07 DIAGNOSIS — I25110 Atherosclerotic heart disease of native coronary artery with unstable angina pectoris: Secondary | ICD-10-CM | POA: Diagnosis present

## 2019-04-07 DIAGNOSIS — I130 Hypertensive heart and chronic kidney disease with heart failure and stage 1 through stage 4 chronic kidney disease, or unspecified chronic kidney disease: Secondary | ICD-10-CM | POA: Diagnosis present

## 2019-04-07 DIAGNOSIS — I214 Non-ST elevation (NSTEMI) myocardial infarction: Secondary | ICD-10-CM | POA: Diagnosis not present

## 2019-04-07 DIAGNOSIS — Z8674 Personal history of sudden cardiac arrest: Secondary | ICD-10-CM

## 2019-04-07 DIAGNOSIS — Z823 Family history of stroke: Secondary | ICD-10-CM

## 2019-04-07 DIAGNOSIS — Z87891 Personal history of nicotine dependence: Secondary | ICD-10-CM | POA: Diagnosis not present

## 2019-04-07 DIAGNOSIS — Z833 Family history of diabetes mellitus: Secondary | ICD-10-CM | POA: Diagnosis not present

## 2019-04-07 DIAGNOSIS — Z7984 Long term (current) use of oral hypoglycemic drugs: Secondary | ICD-10-CM

## 2019-04-07 DIAGNOSIS — N179 Acute kidney failure, unspecified: Secondary | ICD-10-CM | POA: Diagnosis present

## 2019-04-07 DIAGNOSIS — Z66 Do not resuscitate: Secondary | ICD-10-CM | POA: Diagnosis present

## 2019-04-07 DIAGNOSIS — I222 Subsequent non-ST elevation (NSTEMI) myocardial infarction: Secondary | ICD-10-CM | POA: Diagnosis present

## 2019-04-07 DIAGNOSIS — N171 Acute kidney failure with acute cortical necrosis: Secondary | ICD-10-CM | POA: Diagnosis not present

## 2019-04-07 DIAGNOSIS — R079 Chest pain, unspecified: Secondary | ICD-10-CM

## 2019-04-07 LAB — URINALYSIS, ROUTINE W REFLEX MICROSCOPIC
Bilirubin Urine: NEGATIVE
Glucose, UA: >=500 mg/dL — AB
Ketones, ur: NEGATIVE mg/dL
Leukocytes,Ua: NEGATIVE
Nitrite: NEGATIVE
Protein, ur: NEGATIVE mg/dL
Specific Gravity, Urine: 1.02 (ref 1.005–1.030)
pH: 5 (ref 5.0–8.0)

## 2019-04-07 LAB — CBC WITH DIFFERENTIAL/PLATELET
Abs Immature Granulocytes: 0.05 10*3/uL (ref 0.00–0.07)
Basophils Absolute: 0.1 10*3/uL (ref 0.0–0.1)
Basophils Relative: 1 %
Eosinophils Absolute: 0 10*3/uL (ref 0.0–0.5)
Eosinophils Relative: 0 %
HCT: 38.3 % (ref 36.0–46.0)
Hemoglobin: 12.1 g/dL (ref 12.0–15.0)
Immature Granulocytes: 0 %
Lymphocytes Relative: 14 %
Lymphs Abs: 1.6 10*3/uL (ref 0.7–4.0)
MCH: 29.4 pg (ref 26.0–34.0)
MCHC: 31.6 g/dL (ref 30.0–36.0)
MCV: 93.2 fL (ref 80.0–100.0)
Monocytes Absolute: 1 10*3/uL (ref 0.1–1.0)
Monocytes Relative: 8 %
Neutro Abs: 9 10*3/uL — ABNORMAL HIGH (ref 1.7–7.7)
Neutrophils Relative %: 77 %
Platelets: 200 10*3/uL (ref 150–400)
RBC: 4.11 MIL/uL (ref 3.87–5.11)
RDW: 12.9 % (ref 11.5–15.5)
WBC: 11.7 10*3/uL — ABNORMAL HIGH (ref 4.0–10.5)
nRBC: 0 % (ref 0.0–0.2)

## 2019-04-07 LAB — COMPREHENSIVE METABOLIC PANEL
ALT: 15 U/L (ref 0–44)
AST: 16 U/L (ref 15–41)
Albumin: 2.8 g/dL — ABNORMAL LOW (ref 3.5–5.0)
Alkaline Phosphatase: 67 U/L (ref 38–126)
Anion gap: 9 (ref 5–15)
BUN: 33 mg/dL — ABNORMAL HIGH (ref 8–23)
CO2: 26 mmol/L (ref 22–32)
Calcium: 8.5 mg/dL — ABNORMAL LOW (ref 8.9–10.3)
Chloride: 104 mmol/L (ref 98–111)
Creatinine, Ser: 1.96 mg/dL — ABNORMAL HIGH (ref 0.44–1.00)
GFR calc Af Amer: 28 mL/min — ABNORMAL LOW (ref >60)
GFR calc non Af Amer: 24 mL/min — ABNORMAL LOW (ref >60)
Glucose, Bld: 230 mg/dL — ABNORMAL HIGH (ref 70–99)
Potassium: 4.9 mmol/L (ref 3.5–5.1)
Sodium: 139 mmol/L (ref 135–145)
Total Bilirubin: 0.8 mg/dL (ref 0.3–1.2)
Total Protein: 5.9 g/dL — ABNORMAL LOW (ref 6.5–8.1)

## 2019-04-07 LAB — POCT I-STAT 7, (LYTES, BLD GAS, ICA,H+H)
Acid-Base Excess: 1 mmol/L (ref 0.0–2.0)
Bicarbonate: 25.2 mmol/L (ref 20.0–28.0)
Calcium, Ion: 1.19 mmol/L (ref 1.15–1.40)
HCT: 32 % — ABNORMAL LOW (ref 36.0–46.0)
Hemoglobin: 10.9 g/dL — ABNORMAL LOW (ref 12.0–15.0)
O2 Saturation: 100 %
Patient temperature: 98.6
Potassium: 4.9 mmol/L (ref 3.5–5.1)
Sodium: 139 mmol/L (ref 135–145)
TCO2: 26 mmol/L (ref 22–32)
pCO2 arterial: 38.4 mmHg (ref 32.0–48.0)
pH, Arterial: 7.424 (ref 7.350–7.450)
pO2, Arterial: 197 mmHg — ABNORMAL HIGH (ref 83.0–108.0)

## 2019-04-07 LAB — TROPONIN I (HIGH SENSITIVITY)
Troponin I (High Sensitivity): 183 ng/L (ref <18)
Troponin I (High Sensitivity): 167 ng/L (ref <18)

## 2019-04-07 LAB — PROTIME-INR
INR: 1.2 (ref 0.8–1.2)
Prothrombin Time: 15.1 seconds (ref 11.4–15.2)

## 2019-04-07 LAB — MAGNESIUM: Magnesium: 2 mg/dL (ref 1.7–2.4)

## 2019-04-07 LAB — TYPE AND SCREEN
ABO/RH(D): A POS
Antibody Screen: NEGATIVE

## 2019-04-07 LAB — GLUCOSE, CAPILLARY
Glucose-Capillary: 240 mg/dL — ABNORMAL HIGH (ref 70–99)
Glucose-Capillary: 156 mg/dL — ABNORMAL HIGH (ref 70–99)
Glucose-Capillary: 152 mg/dL — ABNORMAL HIGH (ref 70–99)
Glucose-Capillary: 169 mg/dL — ABNORMAL HIGH (ref 70–99)
Glucose-Capillary: 156 mg/dL — ABNORMAL HIGH (ref 70–99)

## 2019-04-07 LAB — HEPARIN LEVEL (UNFRACTIONATED)
Heparin Unfractionated: 0.46 IU/mL (ref 0.30–0.70)
Heparin Unfractionated: 0.49 IU/mL (ref 0.30–0.70)

## 2019-04-07 LAB — APTT: aPTT: 110 seconds — ABNORMAL HIGH (ref 24–36)

## 2019-04-07 LAB — ABO/RH: ABO/RH(D): A POS

## 2019-04-07 LAB — PHOSPHORUS: Phosphorus: 4.3 mg/dL (ref 2.5–4.6)

## 2019-04-07 LAB — LACTIC ACID, PLASMA
Lactic Acid, Venous: 2.1 mmol/L (ref 0.5–1.9)
Lactic Acid, Venous: 2 mmol/L (ref 0.5–1.9)

## 2019-04-07 LAB — PROCALCITONIN: Procalcitonin: 0.21 ng/mL

## 2019-04-07 LAB — TRIGLYCERIDES: Triglycerides: 62 mg/dL (ref <150)

## 2019-04-07 LAB — SARS CORONAVIRUS 2 BY RT PCR (HOSPITAL ORDER, PERFORMED IN ~~LOC~~ HOSPITAL LAB): SARS Coronavirus 2: NEGATIVE

## 2019-04-07 MED ORDER — ORAL CARE MOUTH RINSE
15.0000 mL | OROMUCOSAL | Status: DC
  Administered 2019-04-07 (×3): 15 mL via OROMUCOSAL

## 2019-04-07 MED ORDER — PRO-STAT SUGAR FREE PO LIQD: 60.0000 mL | Freq: Two times a day (BID) | ORAL | Status: DC

## 2019-04-07 MED ORDER — CHLORHEXIDINE GLUCONATE 0.12% ORAL RINSE (MEDLINE KIT)
15.0000 mL | Freq: Two times a day (BID) | OROMUCOSAL | Status: DC
  Administered 2019-04-07: 15 mL via OROMUCOSAL

## 2019-04-07 MED ORDER — ORAL CARE MOUTH RINSE
15.0000 mL | Freq: Two times a day (BID) | OROMUCOSAL | Status: DC
  Administered 2019-04-07 – 2019-04-14 (×11): 15 mL via OROMUCOSAL

## 2019-04-07 MED ORDER — DOCUSATE SODIUM 50 MG/5ML PO LIQD: 100.0000 mg | Freq: Two times a day (BID) | ORAL | Status: DC | PRN

## 2019-04-07 MED ORDER — FUROSEMIDE 10 MG/ML IJ SOLN
40.0000 mg | Freq: Once | INTRAMUSCULAR | Status: AC
  Administered 2019-04-07: 05:00:00 40 mg via INTRAVENOUS
  Filled 2019-04-07: qty 4

## 2019-04-07 MED ORDER — CHLORHEXIDINE GLUCONATE CLOTH 2 % EX PADS
6.0000 | MEDICATED_PAD | Freq: Every day | CUTANEOUS | Status: DC
  Administered 2019-04-07 – 2019-04-09 (×3): 6 via TOPICAL

## 2019-04-07 MED ORDER — CHLORHEXIDINE GLUCONATE 0.12 % MT SOLN
15.0000 mL | Freq: Two times a day (BID) | OROMUCOSAL | Status: DC
  Administered 2019-04-07 – 2019-04-14 (×12): 15 mL via OROMUCOSAL
  Filled 2019-04-07 (×12): qty 15

## 2019-04-07 MED ORDER — ATORVASTATIN CALCIUM 10 MG PO TABS
20.0000 mg | ORAL_TABLET | Freq: Every day | ORAL | Status: DC
  Administered 2019-04-07: 18:00:00 20 mg via ORAL
  Filled 2019-04-07: qty 2

## 2019-04-07 MED ORDER — FENTANYL CITRATE (PF) 100 MCG/2ML IJ SOLN: 25.0000 ug | INTRAMUSCULAR | Status: DC | PRN

## 2019-04-07 MED ORDER — PROPOFOL 1000 MG/100ML IV EMUL: 0.0000 ug/kg/min | INTRAVENOUS | Status: DC

## 2019-04-07 MED ORDER — VITAL HIGH PROTEIN PO LIQD: 1000.0000 mL | ORAL | Status: DC

## 2019-04-07 MED ORDER — HEPARIN (PORCINE) 25000 UT/250ML-% IV SOLN
900.0000 [IU]/h | INTRAVENOUS | Status: DC
  Administered 2019-04-07 – 2019-04-08 (×2): 900 [IU]/h via INTRAVENOUS
  Filled 2019-04-07: qty 250

## 2019-04-07 MED ORDER — INSULIN ASPART 100 UNIT/ML ~~LOC~~ SOLN
1.0000 [IU] | SUBCUTANEOUS | Status: DC
  Administered 2019-04-07 (×2): 2 [IU] via SUBCUTANEOUS
  Administered 2019-04-07: 3 [IU] via SUBCUTANEOUS
  Administered 2019-04-07 (×2): 2 [IU] via SUBCUTANEOUS
  Administered 2019-04-08 (×5): 1 [IU] via SUBCUTANEOUS
  Administered 2019-04-09: 12:00:00 3 [IU] via SUBCUTANEOUS
  Administered 2019-04-09: 05:00:00 1 [IU] via SUBCUTANEOUS
  Administered 2019-04-09 (×2): 2 [IU] via SUBCUTANEOUS
  Administered 2019-04-09: 1 [IU] via SUBCUTANEOUS
  Administered 2019-04-09: 16:00:00 3 [IU] via SUBCUTANEOUS
  Administered 2019-04-10: 05:00:00 1 [IU] via SUBCUTANEOUS
  Administered 2019-04-10: 3 [IU] via SUBCUTANEOUS

## 2019-04-07 MED ORDER — PANTOPRAZOLE SODIUM 40 MG IV SOLR
40.0000 mg | Freq: Every day | INTRAVENOUS | Status: DC
  Administered 2019-04-07 – 2019-04-08 (×2): 40 mg via INTRAVENOUS
  Filled 2019-04-07 (×2): qty 40

## 2019-04-07 MED ORDER — PROPOFOL 1000 MG/100ML IV EMUL
INTRAVENOUS | Status: AC
  Administered 2019-04-07: 04:00:00
  Filled 2019-04-07: qty 100

## 2019-04-07 NOTE — Consult Note (Signed)
CARDIOLOGY CONSULT NOTE  Patient ID: Lori Rivera MRN: [id number] DOB/AGE: [birth year] 75 y.o.  Admit date: 04/07/2019 Primary Care Provider: Garwin Brothers, MD  Primary Cardiologist: Shirlee More, MD  Primary Electrophysiologist:  Will Meredith Leeds, MD  Chief Complaint: Chest Pain Requesting: Critical Care  HPI:   Lori Rivera a 73 y.o.femalewith a history ofCAD s/p multiple PCIs, ischemic cardiomyopathywith ICD in place (Biotronik 2018), HFrEF (40-45%), HTN, HLD, DMII,palpitations who is currently admitted to the ICU for hypoxic respiratory distress requiring intubation. Cardiology is now consulted due to concern that her acute presentation may be due to unrevascularized coronary disease.   Lori Rivera was recently admitted at [facility] from 8/20-8/25 after presenting with chest pain and NSTEMI. She underwent a LHC that revealed severe 3V-CAD. She was evaluated by CT Surgery, but she declined surgical intervention at that time and medical therapy was agreed upon by patient, patient's family, and cardiology team. She was discharged on GDMT with plans to follow-up as an outpatient with cardiology on 8/27. However, on 8/26 patient again developed chest pain and dyspnea. Her symptoms were refractory to [facility] and she called EMS. Upon arrival to OSH ED she was found to be hypoxic with O2 sats in the 60s and was intubated. EKG demonstrated anterolateral ST depressions. Heparin was started. Imaging concerning for pulmonary edema. She briefly required norepinephrine immediately after intubation, likely 2/2 to induction anesthesia. She was later transferred to [facility] for ICU admission.    Past Medical History:  Diagnosis Date   Aortic regurgitation 09/24/2017   Arthritis 09/24/2017   CAD in native artery 03/07/2015   Overview:  1. Out of hospital cardiac arrest Aug 2016 with MI, shock, PCI and 2 Xience DES to mid and distal culprit RCA with diffuse LAD disease and hypothermia protocol 2.  NonSTEMI,05/27/15  Conclusions: 1. Pt was turned down by CTS due to co-morbidities. Pt is for PCI to multiple lesions on LAD and RCA. Ramus is small, will treat medically. 2. Successful PCI / Xience Drug Eluting Stent of the m   Chest pain 08/08/2015   CHF (congestive heart failure) ([place]) 09/24/2017   Chronic combined systolic and diastolic heart failure ([place]) 08/19/2015   Overview:  Echo 08/03/15: Left Ventricle Normal LV chamber size. Normal LV wall thickness. Mildly reduced overall LV systolic function. Estimated LVEF 45% Inferior, posterior and inferoseptal hypokinesis. Abnormal LV diastolic function, Grade 2, with echo evidence of elevated LA pressure.  Overview:  MUGA 20% 09/03/16 Overview:  Echo 08/03/15: Left Ventricle Normal LV chamber size. 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 Pt was turned down by CTS due to co-morbidities. Pt is for PCI to multiple lesions on LAD and RCA. Ramus is small, will treat medically. 2. Successful PCI / Xience Drug Eluting Stent of the mid and distal Left Anterior Descending Coronary Artery. 3. Successful PCI / Xience Drug  Eluting Stent of the ostial and mid Right Coronary Artery. 4. POBA to Di   PNA (pneumonia) 08/08/2015   Pneumonia due to infectious organism 08/19/2015   Overview:   Discharge summary 08/11/15: Her chest x-ray showed airspace opacities concerning for multifocal pneumonia and she will be discharged on oral Levaquin. Follow-up chest x-ray is recommended to ensure resolution.   Shortness of breath 09/24/2017   ST elevation myocardial infarction involving right coronary artery ([place]) 08/03/2015   Overview:  Due to ISR and acute stent thrombosis Cath 08/03/15: Diagnostic procedure: Left Heart Cath PCI procedure: Stent DES, Thrombectomy Complications: No Complications. Conclusions Diagnostic Summary Acute occlusion of RCA ostial stent thrombosis Severe stenosis of the mid RCA, partially ISR Patent LAD stent CTO of small circ system Interventional Summary Successful Thrombectomy/PTCA of the o   STEMI (ST elevation myocardial infarction) ([place]) 08/03/2015   Type 2 diabetes mellitus, without long-term current use of insulin ([place]) 03/07/2015   Vitamin B 12 deficiency 09/24/2017   Vitamin D deficiency 09/24/2017    Past Surgical History:  Procedure Laterality Date   C-spine surgery     CATARACT EXTRACTION, BILATERAL     CHOLECYSTECTOMY     CORONARY ANGIOPLASTY WITH STENT PLACEMENT     INSERT / REPLACE / REMOVE PACEMAKER     ICD, Medtronic   LEFT HEART CATH AND CORONARY ANGIOGRAPHY N/A 03/31/2019   Procedure: LEFT HEART CATH AND CORONARY ANGIOGRAPHY;  Surgeon: Belva Crome, MD;  Location: [place] [facility];  Service: Cardiovascular;  Laterality: N/A;   LITHOTRIPSY     URETEROSCOPY      Allergies  Allergen Reactions   Codeine Other (See Comments)    confused   Penicillins Rash    Did it involve swelling of the face/tongue/throat, SOB, or low BP? Yes Did it involve sudden or severe rash/hives, skin peeling, or any reaction on the inside of your mouth or nose? No Did you need to seek medical attention at a hospital or doctor's office? Yes When did it last happen?20 yrs ago If all above answers are NO, may proceed with cephalosporin use.     Medications Prior to Admission  Medication Sig Dispense Refill Last Dose   aspirin EC 81 MG tablet Take 81 mg by mouth daily.       atorvastatin (LIPITOR) 20 MG tablet Take 20 mg by mouth daily at 6 PM.       clopidogrel (PLAVIX) 75 MG tablet Take 1 tablet (75 mg total) by mouth daily. 90 tablet 3    empagliflozin (JARDIANCE) 10 MG TABS tablet Take 10 mg by mouth daily.      furosemide (LASIX) 40 MG tablet Take 40 mg by mouth daily.       metoprolol tartrate (LOPRESSOR) 50 MG tablet Take 1 tablet (50 mg total) by mouth 3 (three) times daily. (Patient taking differently: Take 50 mg by mouth 2 (two) times daily. ) 270 tablet 0    nitroGLYCERIN (NITROSTAT) 0.4 MG SL tablet Place 0.4 mg under the tongue every 5 (five) minutes as needed.       potassium chloride (MICRO-K) 10 MEQ CR capsule Take 10 mEq by mouth daily.      TRESIBA FLEXTOUCH 100 UNIT/ML SOPN FlexTouch Pen Inject 10 Units into the skin daily.       Family History  Problem Relation Age of Onset   Diabetes Mother    Hypertension Mother    Stroke Mother    Cancer Father    Cancer Sister  CAD Brother    CAD Brother     Social History   Socioeconomic History   Marital status: Widowed    Spouse name: Not on file   Number of children: Not on file   Years of education: Not on file   Highest education level: Not on file  Occupational History   Not on file  Social Needs   Financial resource strain: Not on file   Food insecurity    Worry: Not on file    Inability: Not on file   Transportation needs    Medical: Not on file    Non-medical: Not on file  Tobacco Use   Smoking status: Former Smoker    Quit date: 06/07/1985    Years since quitting: 33.8   Smokeless tobacco: Never Used  Substance and Sexual Activity   Alcohol use: No    Frequency: Never   Drug use: No   Sexual activity: Not on file  Lifestyle   Physical activity    Days per week: Not on file    Minutes per session: Not  on file   Stress: Not on file  Relationships   Social connections    Talks on phone: Not on file    Gets together: Not on file    Attends religious service: Not on file    Active member of club or organization: Not on file    Attends meetings of clubs or organizations: Not on file    Relationship status: Not on file   Intimate partner violence    Fear of current or ex partner: Not on file    Emotionally abused: Not on file    Physically abused: Not on file    Forced sexual activity: Not on file  Other Topics Concern   Not on file  Social History Narrative   Not on file      Review of Systems: Unable to obtain as patient is currently intubated and sedated  Physical Exam: Blood pressure (!) 88/44, pulse 80, temperature 97.9 F (36.6 C), resp. rate 16, height 5\' 7"  (1.702 m), weight 98.1 kg, SpO2 100 %.   GENERAL: Patient is afebrile, Vital signs reviewed, sedated, appears comfortable  HEENT:  normocephalic, atraumatic, intubated CARD:  regular rate and rhythm, heart sounds normal, flat JVD at 45 degrees RESP:  no respiratory distress, coarse breath sounds auscultated in the anterior lung fields  ABD: soft, NT/ND, BS present,  EXT:  1+ pretibial edema bilaterally. NEURO: Sedation limits exam considerably; patient withdraws all extremities to noxious stimuli  Labs: Lab Results  Component Value Date   BUN 32 (H) 04/05/2019   Lab Results  Component Value Date   CREATININE 1.63 (H) 04/05/2019   Lab Results  Component Value Date   NA 139 04/07/2019   K 4.9 04/07/2019   CL 106 04/05/2019   CO2 21 (L) 04/05/2019   No results found for: TROPONINI Lab Results  Component Value Date   WBC 9.2 04/05/2019   HGB 10.9 (L) 04/07/2019   HCT 32.0 (L) 04/07/2019   MCV 92.8 04/05/2019   PLT 168 04/05/2019   Lab Results  Component Value Date   CHOL 125 03/31/2019   HDL 42 03/31/2019   LDLCALC 70 03/31/2019   TRIG 66 03/31/2019   CHOLHDL 3.0 03/31/2019   Lab Results   Component Value Date   ALT 11 01/21/2019   AST 18 01/21/2019   ALKPHOS 73 01/21/2019   BILITOT 0.5 01/21/2019  Radiology:   CXR: Pending EKG: Pending  ASSESSMENT AND PLAN:   Lori Rivera a 73 y.o.femalewith a history ofCAD s/p multiple PCIs, ischemic cardiomyopathywith ICD in place (Biotronik 2018), HFrEF (40-45%), HTN, HLD, DMII,palpitations who is currently admitted to the ICU for hypoxic respiratory distress requiring intubation. Cardiology is now consulted due to concern that her acute presentation may be due to unrevascularized coronary disease.   Patient with known severe 3V-CAD with recent discharge from the cardiology service on 8/25. During her previous admission, she was evaluated by CT surgery but ultimately was not amenable to moving forward with surgical intervention. She was discharged on medical therapy with plans to followup as an outpatient, but unfortunately developed recurrent chest pain and respiratory failure 2/2 pulmonary edema. Whether this represents failure of medical therapy is difficult to say, as she was so recently discharged. Equally difficult to say is where we go from here treatment-wise (ie repeat surgical evaluation vs high risk PCI vs retrial of medical therapy). Fortunately, she is on minimal ventilatory support and hopefully will be able to participate in future conversations regarding goals of care.  For now: - Continue empiric heparin gtt (goal PTT 50-75) - Agree with continued diuresis, aiming for a net negative goal of 1-2L daily - Cont DAPT with ASA and plavix - Continue statin - Hold patient's Metoprolol in setting of HoTN - Cont to wean ventilatory support as appropriate - Trend troponin and follow-up labs drawn on admission  Signed: Clois Dupes 04/07/2019, 4:33 AM

## 2019-04-07 NOTE — Progress Notes (Signed)
ANTICOAGULATION CONSULT NOTE - Initial Consult  Pharmacy Consult for Heparin Indication: chest pain/ACS  Allergies  Allergen Reactions  . Codeine Other (See Comments)    confused  . Penicillins Rash    Did it involve swelling of the face/tongue/throat, SOB, or low BP? Yes Did it involve sudden or severe rash/hives, skin peeling, or any reaction on the inside of your mouth or nose? No Did you need to seek medical attention at a hospital or doctor's office? Yes When did it last happen?20 yrs ago If all above answers are "NO", may proceed with cephalosporin use.     Patient Measurements: Height: 5\' 7"  (170.2 cm) Weight: 216 lb 4.3 oz (98.1 kg) IBW/kg (Calculated) : 61.6 Heparin Dosing Weight: 83.3 kg  Vital Signs: Temp: 99.5 F (37.5 C) (08/27 1300) Temp Source: Bladder (08/27 1100) BP: 122/55 (08/27 1300) Pulse Rate: 28 (08/27 1300)  Labs: Recent Labs    04/04/19 2201  04/05/19 3785 04/05/19 8850 04/07/19 0356 04/07/19 0610 04/07/19 0908 04/07/19 1123  HGB  --    < > 12.3  --  10.9* 12.1  --   --   HCT  --   --  38.5  --  32.0* 38.3  --   --   PLT  --   --  168  --   --  200  --   --   APTT  --   --   --   --   --  110*  --   --   LABPROT  --   --   --   --   --  15.1  --   --   INR  --   --   --   --   --  1.2  --   --   HEPARINUNFRC 0.95*  --   --  0.83*  --   --   --  0.46  CREATININE  --   --  1.63*  --   --  1.96*  --   --   TROPONINIHS  --   --   --   --   --  183* 167*  --    < > = values in this interval not displayed.    Estimated Creatinine Clearance: 29.8 mL/min (A) (by C-G formula based on SCr of 1.96 mg/dL (H)).   Medical History: Past Medical History:  Diagnosis Date  . Aortic regurgitation 09/24/2017  . Arthritis 09/24/2017  . CAD in native artery 03/07/2015   Overview:  1. Out of hospital cardiac arrest Aug 2016 with MI, shock, PCI and 2 Xience DES to mid and distal culprit RCA with diffuse LAD disease and hypothermia protocol 2.  NonSTEMI,05/27/15  Conclusions: 1. Pt was turned down by CTS due to co-morbidities. Pt is for PCI to multiple lesions on LAD and RCA. Ramus is small, will treat medically. 2. Successful PCI / Xience Drug Eluting Stent of the m  . Chest pain 08/08/2015  . CHF (congestive heart failure) ([place]) 09/24/2017  . Chronic combined systolic and diastolic heart failure ([place]) 08/19/2015   Overview:  Echo 08/03/15: Left Ventricle Normal LV chamber size. Normal LV wall thickness. Mildly reduced overall LV systolic function. Estimated LVEF 45% Inferior, posterior and inferoseptal hypokinesis. Abnormal LV diastolic function, Grade 2, with echo evidence of elevated LA pressure.  Overview:  MUGA 20% 09/03/16 Overview:  Echo 08/03/15: Left Ventricle Normal LV chamber size. Normal LV wall   . Chronic systolic heart failure ([place]) 06/07/2015  .  CKD (chronic kidney disease) stage 3, GFR 30-59 ml/min (HCC) 06/07/2015  . Coronary artery disease involving native heart with angina pectoris ([place]) 06/29/2017   Overview:  Added automatically from request for surgery [id number]  . 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 Essential hypertension 06/29/2017  . Hyperlipidemia 03/07/2015  . 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 Ischemic cardiomyopathy 11/18/2015   Overview:  EF 20-25%  . NSTEMI (non-ST elevated myocardial infarction) ([place]) 05/25/2015   Overview:  Cardiac cath 05/28/15 :Conclusions 1. Pt was turned down by CTS due to co-morbidities. Pt is for PCI to multiple lesions on LAD and RCA. Ramus is small, will treat medically. 2. Successful PCI / Xience Drug Eluting Stent of the mid and distal Left Anterior Descending Coronary Artery. 3. Successful PCI / Xience Drug Eluting Stent of the ostial and mid Right Coronary Artery. 4. POBA to Di  . PNA (pneumonia) 08/08/2015  . Pneumonia due to infectious organism 08/19/2015   Overview:   Discharge summary 08/11/15: Her chest x-ray showed airspace opacities concerning for multifocal pneumonia and she will be discharged on oral Levaquin. Follow-up chest x-ray is recommended to ensure resolution.  . Shortness of breath 09/24/2017  . ST elevation myocardial infarction involving right coronary artery ([place]) 08/03/2015   Overview:  Due to ISR and acute stent thrombosis Cath 08/03/15: Diagnostic procedure: Left Heart Cath PCI procedure: Stent DES, Thrombectomy Complications: No Complications. Conclusions Diagnostic Summary Acute occlusion of RCA ostial stent thrombosis Severe stenosis of the mid RCA, partially ISR Patent LAD stent CTO of small circ system Interventional Summary Successful Thrombectomy/PTCA of the o  . STEMI (ST elevation myocardial infarction) ([place]) 08/03/2015  . Type 2 diabetes mellitus, without long-term current use of insulin ([place]) 03/07/2015  . Vitamin B 12 deficiency 09/24/2017  . Vitamin D deficiency 09/24/2017    Medications:  Infusions:  . feeding supplement (VITAL HIGH PROTEIN)    . heparin 900 Units/hr (04/07/19 1300)  . propofol (DIPRIVAN) infusion 5 mcg/kg/min (04/07/19 1300)    Assessment: Lori Rivera transferred from [facility] after presenting with CP and SOB. Heparin was already running at 900 units/hr when she arrived. Pharmacy has been consulted to dose heparin.  She was recently admitted for NSTEMI on 8/20. During this admission, she was therapeutic on a drip rate of 900 units/hr.  Heparin level is therapeutic at 0.46 while on 900 units/hr. Hgb low but stable at 12.1, plts wnl. No reports of bleeding per RN.    Goal of Therapy:  Heparin level 0.3-0.7 units/ml Monitor platelets by anticoagulation protocol: Yes   Plan:  - Continue heparin infusion at 900 units/hr - Check heparin level in 8 hours and daily while on heparin - Continue to monitor H&H and platelets  Richardine Service, PharmD PGY1 Pharmacy Resident Phone: [phone] 04/07/2019  1:40  PM  Please check [email or web address] for unit-specific pharmacy phone numbers.

## 2019-04-07 NOTE — Progress Notes (Signed)
Afternoon rounds: Fully awake currently on pressure support ventilation appears comfortable.  Spoke to cardiology who do not feel she would need ventilation to facilitate further cardiac diagnostics if indicated or decided upon.  She currently appears very comfortable on pressure support of 5 she is appropriate and following commands.  Plan Extubate to nasal cannula Keep in ICU

## 2019-04-07 NOTE — Progress Notes (Signed)
Initial Nutrition Assessment  DOCUMENTATION CODES:   Obesity unspecified  INTERVENTION:   Tube feeding: - Vital High Protein @ 35 ml/hr (840 ml/day) - Pro-stat 60 ml BID  Tube feeding regimen provides 1240 kcal, 134 grams of protein, and 702 ml of H2O.   Tube feeding regimen and current propofol provides 1456 total kcal (>100% of needs).  NUTRITION DIAGNOSIS:   Inadequate oral intake related to acute illness as evidenced by estimated needs  GOAL:   Provide needs based on ASPEN/SCCM guidelines  MONITOR:   Vent status, Labs, Weight trends, Skin, I & O's, TF tolerance  REASON FOR ASSESSMENT:   Ventilator, Consult Enteral/tube feeding initiation and management  ASSESSMENT:   73 year old female who was admitted from [facility] with respiratory failure s/p intubation, acute pulmonary edema. Pt was discharged from [facility] on 8/25 after being admitted for NSTEMI. PMH of CAD, CHF, cardiac arrest in 2016, VT with ICD, and DM.   Discussed pt with RN and during ICU rounds. Cardiology trying to decide on possible intervention.  OG tube in place, currently to low intermittent suction.  RD consulted for tube feeding initiation and management.  Patient is currently intubated on ventilator support MV: 7.7 L/min Temp (24hrs), Avg:98.2 F (36.8 C), Min:97.3 F (36.3 C), Max:99.3 F (37.4 C) BP: 112/45 MAP: 64  Drips: Propofol: 8.2 ml/hr (provides 216 kcal daily from lipid) Heparin: 9 ml/hr  Medications reviewed and include: SSI q 4 hours, Protonix  Labs reviewed. CBG's: 156, 240  NUTRITION - FOCUSED PHYSICAL EXAM:    Most Recent Value  Orbital Region  No depletion  Upper Arm Region  No depletion  Thoracic and Lumbar Region  No depletion  Buccal Region  No depletion  Temple Region  No depletion  Clavicle Bone Region  Mild depletion  Clavicle and Acromion Bone Region  Mild depletion  Scapular Bone Region  Unable to assess  Dorsal Hand  No depletion  Patellar  Region  No depletion  Anterior Thigh Region  No depletion  Posterior Calf Region  No depletion  Edema (RD Assessment)  Moderate [BUE, BLE]  Hair  Reviewed  Eyes  Reviewed  Mouth  Unable to assess  Skin  Reviewed  Nails  Reviewed       Diet Order:   Diet Order            Diet NPO time specified  Diet effective now              EDUCATION NEEDS:   No education needs have been identified at this time  Skin:  Skin Assessment: Skin Integrity Issues: Skin Integrity Issues:: Stage II, Incisions Stage II: buttocks Incisions: right leg  Last BM:  no documented BM  Height:   Ht Readings from Last 1 Encounters:  04/07/19 5\' 7"  (1.702 m)    Weight:   Wt Readings from Last 1 Encounters:  04/07/19 98.1 kg    Ideal Body Weight:  61.4 kg  BMI:  Body mass index is 33.87 kg/m.  Estimated Nutritional Needs:   Kcal:  0076-2263  Protein:  125-150 grams  Fluid:  >/= 1.4 L    Gaynell Face, MS, RD, LDN Inpatient Clinical Dietitian Pager: [phone] Weekend/After Hours: [phone]

## 2019-04-07 NOTE — Progress Notes (Signed)
    Creatinine 1.96 up from 1.6.  High-sensitivity troponin  183 which is down from previous admission.  Had chest pain leading up to this event.  Currently on 40% FiO2.  Readmission.  Just left the hospital.  Has severe three-vessel coronary disease with a potential option for high risk PCI to the LAD.  On discharge the plan was to continue with aggressive secondary risk factor prevention, medical management and see how she did at home.  She was very stable in her last few days here in the hospital without any chest discomfort.  It appears that angina preceded this event and perhaps we now need to consider the option of high risk PCI to her LAD.  This vessel even postatherectomy would be a fairly small vessel.  I will speak to our interventional team once again for potential options.  1 potential question is do we keep her intubated if we want to proceed with PCI to stabilize respiratory system during high risk PCI.  She was able to open her eyes in response to my voice.  Looking around.  FiO2 currently 40.  Discussion with critical care team, nursing, extensive lab review, review of prior cardiac catheterization in this complex situation with multisystem involvement.  CRITICAL CARE Performed by: Candee Furbish   Total critical care time: 35 minutes  Critical care time was exclusive of separately billable procedures and treating other patients.  Critical care was necessary to treat or prevent imminent or life-threatening deterioration.  Critical care was time spent personally by me on the following activities: development of treatment plan with patient and/or surrogate as well as nursing, discussions with consultants, evaluation of patient's response to treatment, examination of patient, obtaining history from patient or surrogate, ordering and performing treatments and interventions, ordering and review of laboratory studies, ordering and review of radiographic studies, pulse oximetry  and re-evaluation of patient's condition.  Candee Furbish, MD

## 2019-04-07 NOTE — Progress Notes (Signed)
[place] for Heparin Indication: chest pain/ACS  Allergies  Allergen Reactions  . Codeine Other (See Comments)    confused  . Penicillins Rash    Did it involve swelling of the face/tongue/throat, SOB, or low BP? Yes Did it involve sudden or severe rash/hives, skin peeling, or any reaction on the inside of your mouth or nose? No Did you need to seek medical attention at a hospital or doctor's office? Yes When did it last happen?20 yrs ago If all above answers are "NO", may proceed with cephalosporin use.     Patient Measurements: Height: 5\' 7"  (170.2 cm) Weight: 216 lb 4.3 oz (98.1 kg) IBW/kg (Calculated) : 61.6 Heparin Dosing Weight: 83.3 kg  Vital Signs: Temp: 100 F (37.8 C) (08/27 1900) Temp Source: Bladder (08/27 1900) BP: 108/40 (08/27 1900) Pulse Rate: 88 (08/27 1900)  Labs: Recent Labs    04/05/19 2458 04/05/19 0998 04/07/19 0356 04/07/19 0610 04/07/19 0908 04/07/19 1123 04/07/19 1908  HGB 12.3  --  10.9* 12.1  --   --   --   HCT 38.5  --  32.0* 38.3  --   --   --   PLT 168  --   --  200  --   --   --   APTT  --   --   --  110*  --   --   --   LABPROT  --   --   --  15.1  --   --   --   INR  --   --   --  1.2  --   --   --   HEPARINUNFRC  --  0.83*  --   --   --  0.46 0.49  CREATININE 1.63*  --   --  1.96*  --   --   --   TROPONINIHS  --   --   --  183* 167*  --   --     Estimated Creatinine Clearance: 29.8 mL/min (A) (by C-G formula based on SCr of 1.96 mg/dL (H)).   Medical History: Past Medical History:  Diagnosis Date  . Aortic regurgitation 09/24/2017  . Arthritis 09/24/2017  . CAD in native artery 03/07/2015   Overview:  1. Out of hospital cardiac arrest Aug 2016 with MI, shock, PCI and 2 Xience DES to mid and distal culprit RCA with diffuse LAD disease and hypothermia protocol 2. NonSTEMI,05/27/15  Conclusions: 1. Pt was turned down by CTS due to co-morbidities. Pt is for PCI to multiple lesions on  LAD and RCA. Ramus is small, will treat medically. 2. Successful PCI / Xience Drug Eluting Stent of the m  . Chest pain 08/08/2015  . CHF (congestive heart failure) ([place]) 09/24/2017  . Chronic combined systolic and diastolic heart failure ([place]) 08/19/2015   Overview:  Echo 08/03/15: Left Ventricle Normal LV chamber size. Normal LV wall thickness. Mildly reduced overall LV systolic function. Estimated LVEF 45% Inferior, posterior and inferoseptal hypokinesis. Abnormal LV diastolic function, Grade 2, with echo evidence of elevated LA pressure.  Overview:  MUGA 20% 09/03/16 Overview:  Echo 08/03/15: Left Ventricle Normal LV chamber size. Normal LV wall   . Chronic systolic heart failure ([place]) 06/07/2015  . CKD (chronic kidney disease) stage 3, GFR 30-59 ml/min (HCC) 06/07/2015  . Coronary artery disease involving native heart with angina pectoris ([place]) 06/29/2017   Overview:  Added automatically from request for surgery [phone]  . 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 Essential hypertension 06/29/2017  . Hyperlipidemia 03/07/2015  . 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 Ischemic cardiomyopathy 11/18/2015   Overview:  EF 20-25%  . NSTEMI (non-ST elevated myocardial infarction) ([place]) 05/25/2015   Overview:  Cardiac cath 05/28/15 :Conclusions 1. Pt was turned down by CTS due to co-morbidities. Pt is for PCI to multiple lesions on LAD and RCA. Ramus is small, will treat medically. 2. Successful PCI / Xience Drug Eluting Stent of the mid and distal Left Anterior Descending Coronary Artery. 3. Successful PCI / Xience Drug Eluting Stent of the ostial and mid Right Coronary Artery. 4. POBA to Di  . PNA (pneumonia) 08/08/2015  . Pneumonia due to infectious organism 08/19/2015   Overview:  Discharge summary 08/11/15: Her chest x-ray showed airspace opacities concerning for multifocal pneumonia and she will  be discharged on oral Levaquin. Follow-up chest x-ray is recommended to ensure resolution.  . Shortness of breath 09/24/2017  . ST elevation myocardial infarction involving right coronary artery ([place]) 08/03/2015   Overview:  Due to ISR and acute stent thrombosis Cath 08/03/15: Diagnostic procedure: Left Heart Cath PCI procedure: Stent DES, Thrombectomy Complications: No Complications. Conclusions Diagnostic Summary Acute occlusion of RCA ostial stent thrombosis Severe stenosis of the mid RCA, partially ISR Patent LAD stent CTO of small circ system Interventional Summary Successful Thrombectomy/PTCA of the o  . STEMI (ST elevation myocardial infarction) ([place]) 08/03/2015  . Type 2 diabetes mellitus, without long-term current use of insulin ([place]) 03/07/2015  . Vitamin B 12 deficiency 09/24/2017  . Vitamin D deficiency 09/24/2017    Medications:  Infusions:  . heparin 900 Units/hr (04/07/19 1900)    Assessment: 73 YOF [facility] from [facility] after presenting with CP and SOB. Heparin was already running at 900 units/hr when she arrived. Pharmacy has been consulted to dose heparin.  She was recently admitted for NSTEMI on 8/20. During this admission, she was therapeutic on a drip rate of 900 units/hr.  Heparin level is therapeutic x2 on 900 units/hr. Hgb low but stable at 12.1, plts wnl. No reports of bleeding per RN.    Goal of Therapy:  Heparin level 0.3-0.7 units/ml Monitor platelets by anticoagulation protocol: Yes   Plan:  - Continue heparin infusion at 900 units/hr - Continue to monitor H&H and platelets  Erin Hearing PharmD., BCPS Clinical Pharmacist 04/07/2019 7:39 PM

## 2019-04-07 NOTE — Progress Notes (Signed)
eLink Physician-Brief Progress Note Patient Name: AVONDA TOSO DOB: Dec 29, 1945 MRN: [id number]   Date of Service  04/07/2019  HPI/Events of Note  Pt recently discharged from [facility], admitted from [facility] ED with acute  Respiratory failure s/p intubation, acute pulmonary edema, r/o MI, history of CAD.  eICU Interventions  New patient evaluation completed, PCCM on the ground requested to evaluate patient and complete H & P.        [place]wo U Ogan 04/07/2019, 3:20 AM

## 2019-04-07 NOTE — Plan of Care (Signed)

## 2019-04-07 NOTE — Procedures (Signed)
Extubation Procedure Note  Patient Details:   Name: CARAH BARRIENTES DOB: [birth year] MRN: [id number]   Airway Documentation:    Vent end date: 04/07/19 Vent end time: 1420   Evaluation  O2 sats: stable throughout Complications: No apparent complications Patient did tolerate procedure well. Bilateral Breath Sounds: Diminished   Yes   Pt extubated per physician order. Pt with leak prior to extubation. Pt able to speak name, good cough and no stridor was heard. Pt placed on 2L nasal cannula. RT will continue to monitor.   Sharla Kidney 04/07/2019, 2:29 PM

## 2019-04-07 NOTE — Progress Notes (Signed)
    Spoke to Dr. Tamala Julian of interventional team who will once again coordinate with his colleagues for potential PCI of LAD, high risk.  We will go ahead and proceed with extubation.  Creatinine needs to be monitored closely for she is at risk for acute kidney injury in the setting of contrast load.  During last hospitalization, her creatinine peaked at 2.2 and decreased to 1.6 at discharge.  Hypothesis is that she developed anginal symptoms with subsequent flash pulmonary edema that quickly resolved with both positive pressure ventilation as well as single dose of IV Lasix.  She is already declined CABG (discussion with daughter)  Candee Furbish, MD

## 2019-04-07 NOTE — H&P (Addendum)
NAME:  Lori Rivera, MRN:  [id number], DOB:  07/11/1946, LOS: 0 ADMISSION DATE:  04/07/2019, CONSULTATION DATE:  8/27 REFERRING MD: Oval Linsey EDP, CHIEF COMPLAINT:  Chest pain.    Brief History   73 year old female with cardiac history discharged from [facility] 8/25 after admit for NSTEMI. Then 8/26 presented to [facility] ED with CP and SOB. Intubated for hypoxia. Transferred to [facility].   History of present illness   73 year old female with PMH as below, which is significant for CAD, HFrEF (40-45%), Cardiac arrest in 2016, VT with ICD, and DM. She was admitted to [facility] from 8/20 to 8/25 for NSTEMI. She underwent cardiac catheterization 8/20, which demonstrated severe three-vessel CAD. There was some consideration for CABG, however, after meeting with CVTS the patient determined she would not want to undergo such a major operation. Felt to be high risk for PCI. She was recommended medical management and was discharged to home.   Then on 8/26 she again developed chest pain and dyspnea. Symptoms did not improve with sublingual nitroglycerine and she called EMS. Upon arrival she was satting poorly and was started on CPAP. Upon arrival to [facility] ED O2 sats were in the 60s and she required intubation. EKG demonstrated lateral ST depression. Heparin was started. Imaging concerning for pulmonary edema. She briefly required norepinephrine immediately after intubation. She was transferred to [facility] for ICU admission.   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 (04/15/2017), Essential hypertension (06/29/2017), Hyperlipidemia (03/07/2015), 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   Significant Hospital Events   8/20 > 8/25 admit for NSTEMI 8/26 admit for CHF, intubated  Consults:  Cardiology 8/27  Procedures:  ETT 8/27 >  Significant Diagnostic Tests:    Micro Data:    Antimicrobials:     Interim history/subjective:    Objective   There were no vitals taken for this visit.       No intake or output data in the 24 hours ending 04/07/19 0333 There were no vitals filed for this visit.  Examination: General: Elderly female on vent HENT: [place]/AT, PERRL, unable to appreciate JVD  Lungs: Bibasilar crackles Cardiovascular: RRR, no MRG. 2+ pitting lower extremity edema.  Abdomen: Soft, Non-distended Extremities: No acute deformity. Sutured area on medial aspect of RLE.  Neuro: RASS -2  Resolved Hospital Problem list     Assessment & Plan:   Acute hypoxic respiratory failure: in the setting of cardiogenic pulmonary edema - Full vent support - CXR pending - ABG pending - VAP bundle - Propofol infusion for RASS goal -1 to -2.  - Daily SBT and WUA - Considered diuresis, but CXR now looks clear. Defer to cardiology.   Acute on  chronic HFrEF: known history of ischemic cardiomyopathy Unstable  angina: Troponin very mildly elevated at [place] ED CAD: 3 vessel CAD by cath 8/20. Refused CABG workup. Medical management suggested.  VT: history. Followed by EP, Biotronik ICD in place. - Continue heparin infusion - Continue statin - holding asa/plavis considering may need procedures.  - Consult cardiology - Trend troponin - EKG  AKI CKD - Repeat BMP now  DM - CBG monitoring and SSI  Global: Spoke with daughter Joelene Millin on phone. Tells me that Jaliyah has had DNR established for several years. OK to continue with ETT for now, but no long term. Would not want additional lines or procedures.    Best practice:  Diet: NPO Pain/Anxiety/Delirium protocol (if indicated): Prop per protocol VAP protocol (if indicated): Yes DVT prophylaxis: heparin infusion GI prophylaxis: PPI Glucose control: SSI Mobility: BR Code Status: FULL  Family Communication: Daughter Joelene Millin updated 0400. Disposition: ICU  Labs   CBC: Recent Labs  Lab 04/01/19 0143 04/02/19 0227 04/03/19 0226 04/03/19 1946 04/05/19 0632  WBC 7.1 7.3 7.2 7.1 9.2  HGB 11.4* 11.4* 10.7* 11.4* 12.3  HCT 35.7* 35.7* 33.6* 35.0* 38.5  MCV 92.7 92.2 93.1 93.1 92.8  PLT 186 173 168 157 098    Basic Metabolic Panel: Recent Labs  Lab 04/01/19 0143 04/02/19 0227 04/03/19 1946 04/04/19 0231 04/05/19 0632  NA 139 140 134* 137 137  K 4.1 4.1 4.8 4.7 4.5  CL 109 108 104 106 106  CO2 21* 23 21* 24 21*  GLUCOSE 130* 143* 210* 239* 195*  BUN 20 26* 33* 35* 32*  CREATININE 1.47* 1.65* 2.29* 2.13* 1.63*  CALCIUM 8.2* 8.4* 8.4* 8.4* 8.5*   GFR: Estimated Creatinine Clearance: 37.3 mL/min (A) (by C-G formula based on SCr of 1.63 mg/dL (H)). Recent Labs  Lab 04/02/19 0227 04/03/19 0226 04/03/19 1946 04/05/19 0632  WBC 7.3 7.2 7.1 9.2    Liver Function Tests: No results for input(s): AST, ALT, ALKPHOS, BILITOT, PROT, ALBUMIN in the last 168 hours. No results for input(s): LIPASE, AMYLASE in the last 168  hours. No results for input(s): AMMONIA in the last 168 hours.  ABG No results found for: PHART, PCO2ART, PO2ART, HCO3, TCO2, ACIDBASEDEF, O2SAT   Coagulation Profile: No results for input(s): INR, PROTIME in the last 168 hours.  Cardiac Enzymes: No results for input(s): CKTOTAL, CKMB, CKMBINDEX, TROPONINI in the last 168 hours.  HbA1C: Hgb A1c MFr Bld  Date/Time Value Ref Range Status  03/31/2019 10:13 AM 8.6 (H) 4.8 - 5.6 % Final    Comment:    (NOTE) Pre diabetes:          5.7%-6.4% Diabetes:              >6.4% Glycemic control for   <7.0% adults with diabetes     CBG: Recent Labs  Lab 04/04/19 1626 04/04/19 2121 04/05/19 0631 04/05/19 0841 04/05/19 1035  GLUCAP 169* 188* 183* 216* 221*    Review of Systems:   Unable as patient is intubated and encephalopathic  Past Medical History  She,  has a past medical history of Aortic regurgitation (09/24/2017), Arthritis (09/24/2017), CAD in native artery (03/07/2015), Chest pain (08/08/2015), CHF (congestive heart failure) ([place]) (09/24/2017), Chronic combined systolic and diastolic heart failure ([place]) (08/11/9145), Chronic systolic heart failure ([place]) (06/07/2015), CKD (chronic kidney disease) stage 3, GFR 30-59 ml/min (HCC) (06/07/2015), Coronary artery disease involving native heart with angina pectoris ([place]) (06/29/2017), 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 (04/15/2017), Essential hypertension (06/29/2017), Hyperlipidemia (  03/07/2015), 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   Surgical History    Past Surgical History:  Procedure Laterality Date   C-spine surgery     CATARACT EXTRACTION, BILATERAL     CHOLECYSTECTOMY     CORONARY ANGIOPLASTY WITH STENT PLACEMENT     INSERT / REPLACE / REMOVE PACEMAKER     ICD, Medtronic   LEFT HEART CATH AND CORONARY ANGIOGRAPHY N/A 03/31/2019   Procedure: LEFT HEART CATH AND CORONARY ANGIOGRAPHY;  Surgeon: Belva Crome, MD;  Location: [place] [facility];  Service: Cardiovascular;  Laterality: N/A;   LITHOTRIPSY     URETEROSCOPY       Social History   reports that she quit smoking about 33 years ago. She has never used smokeless tobacco. She reports that she does not drink alcohol or use drugs.   Family History   Her family history includes CAD in her brother and brother; Cancer in her father and sister; Diabetes in her mother; Hypertension in her mother; Stroke in her mother.   Allergies Allergies  Allergen Reactions   Codeine Other (See Comments)    confused   Penicillins Rash    Did it involve swelling of the face/tongue/throat, SOB, or low BP? Yes Did it involve sudden or severe rash/hives, skin peeling, or any reaction on the inside of your mouth or nose? No Did you need to seek medical attention at a hospital or doctor's office? Yes When did it last happen?20 yrs ago If all above answers are NO, may proceed with cephalosporin use.      Home Medications  Prior to Admission medications   Medication Sig Start Date End Date Taking? Authorizing Provider  aspirin EC 81 MG tablet Take 81 mg by mouth daily.     [provider]  atorvastatin (LIPITOR) 20 MG tablet Take 20 mg by mouth daily at 6 PM.  04/14/14   [provider]  clopidogrel (PLAVIX) 75 MG tablet Take 1 tablet (75 mg total) by mouth daily. 04/05/19 03/30/20  Daune Perch, NP  empagliflozin  (JARDIANCE) 10 MG TABS tablet Take 10 mg by mouth daily.    [provider]  furosemide (LASIX) 40 MG tablet Take 40 mg by mouth daily.  04/14/14   [provider]  metoprolol tartrate (LOPRESSOR) 50 MG tablet Take 1 tablet (50 mg total) by mouth 3 (three) times daily. Patient taking differently: Take 50 mg by mouth 2 (two) times daily.  01/17/19   [name], MD  nitroGLYCERIN (NITROSTAT) 0.4 MG SL tablet Place 0.4 mg under the tongue every 5 (five) minutes as needed.  06/11/15 10/12/21  [provider]  potassium chloride (MICRO-K) 10 MEQ CR capsule Take 10 mEq by mouth daily.    [provider]  TRESIBA FLEXTOUCH 100 UNIT/ML SOPN FlexTouch Pen Inject 10 Units into the skin daily.  01/16/19   [provider]     CRITICAL CARE  Performed in the setting of acute hypoxemic respiratory failure and acute on chronic HFrEF  Total critical care time: 45 minutes  Critical care time was exclusive of separately  billable procedures and treating other patients.  Critical care was necessary to treat or prevent imminent or life-threatening deterioration.  Critical care was time spent personally by me on the following activities: development of treatment plan with patient and/or surrogate as well as nursing, discussions with consultants, evaluation of patient's response to treatment, examination of patient, obtaining history from patient or surrogate, ordering and performing treatments and interventions, ordering and review of laboratory studies, ordering and review of radiographic studies, pulse oximetry and re-evaluation of patient's condition.   Georgann Housekeeper, AGACNP-BC [place] Pager [phone] or [phone]  04/07/2019 4:15 AM

## 2019-04-07 NOTE — Progress Notes (Addendum)
NAME:  Lori Rivera, MRN:  [id number], DOB:  Jan 20, 1946, LOS: 0 ADMISSION DATE:  04/07/2019, CONSULTATION DATE:  8/27 REFERRING MD: Oval Linsey EDP, CHIEF COMPLAINT:  Chest pain.    Brief History   73 year old female with cardiac history discharged from [facility] 8/25 after admit for NSTEMI. Then 8/26 presented to [facility] ED with CP and SOB. Intubated for hypoxia. Transferred to [facility].    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 (04/15/2017), Essential hypertension (06/29/2017), Hyperlipidemia (03/07/2015), 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   Significant Hospital Events   8/20 > 8/25 admit for NSTEMI 8/26 admit for CHF, intubated  Consults:  Cardiology 8/27  Procedures:  ETT 8/27 >  Significant Diagnostic Tests:    Micro Data:    Antimicrobials:     Interim history/subjective:   Looks better. Now fully  awake and appropriate  Objective   Blood pressure (Abnormal) 112/45, pulse 76, temperature 98.8 F (37.1 C), resp. rate 17, height 5\' 7"  (1.702 m), weight 98.1 kg, SpO2 100 %.    Vent Mode: PRVC FiO2 (%):  [40 %-60 %] 40 % Set Rate:  [16 bmp] 16 bmp Vt Set:  [490 mL] 490 mL PEEP:  [5 cmH20] 5 cmH20 Plateau Pressure:  [17 cmH20-18 cmH20] 17 cmH20   Intake/Output Summary (Last 24 hours) at 04/07/2019 1102 Last data filed at 04/07/2019 0800 Gross per 24 hour  Intake 13.98 ml  Output 1200 ml  Net -1186.02 ml   Filed Weights   04/07/19 0307  Weight: 98.1 kg    Examination:  General: frail 73 year old white female. Currently sedated on vent HENT: NCAT no JVD orally intubated Pulm scattered rales/wheeze equal chest rise Card RRR + systolic murmur over LSB abd not tender Ext LE edema. There are sutures on left lower extremity if uncertain etiology, scattered areas of ecchymosis on UEs gu cl yellow Neuro opens eyes. Follow commands   Resolved Hospital Problem list     Assessment & Plan:   Acute hypoxic respiratory failure in setting of diffuse edema from decompensated HF  pcxr personally reviewed.ETT good position. Infiltrates improved. Plan Cont full vent support, currently on pressure support/spontaneous breathing trial VAP bundle  Lasix as able, will hold further dosing as chest x-ray clear PAD protocol while intubated Assess for extubation  Unstable angina in setting of severe 3 vessel CAD; now readmitted w/ Acute on chronic HFrEF:  -h/o VT - Followed by EP, Biotronik ICD in place. Plan Cont heparin infusion Cont statin Holding asa/plavix for now Trend trops Cards consulted. Trying to decide on possible intervention again. Issue being target vessel would be LAD and this is a small caliber vessel  Acute on chronic renal failure -looks like  baseline cr 1.6 to 2 range Plan Renal dose meds as appropriate  Serial chemistries Strict I&O  Mild Lactic  acidosis=->improved Plan No need to trend further   DM w/ hyperglycemia  Plan ssi   Global: Per P Hoiffman ACNP: Spoke with daughter Joelene Millin on phone. Tells me that [place] has had DNR established for several years. OK to continue with ETT for now, but no long term. Would not want additional lines or procedures.    Best practice:  Diet: NPO; will start tubefeeds Pain/Anxiety/Delirium protocol (if indicated): Prop per protocol VAP protocol (if indicated): Yes DVT prophylaxis: heparin infusion GI prophylaxis: PPI Glucose control: SSI Mobility: BR Code Status: FULL  Family Communication: Daughter Joelene Millin updated 0400. Disposition: ICU   cct 32 minutes  Critically ill due to acute hypoxic respiratory failure and volume overload. Case discussed at bedside w/ consulting cardiology team to decide on further appropriate plan of care    Erick Colace ACNP-BC [place] Pager # [phone] OR # [phone] if no answer

## 2019-04-08 ENCOUNTER — Encounter (HOSPITAL_COMMUNITY): Payer: Self-pay

## 2019-04-08 ENCOUNTER — Inpatient Hospital Stay (HOSPITAL_COMMUNITY): Admission: AD | Disposition: A | Discharge status: Home Health | Payer: Self-pay | Attending: Internal Medicine

## 2019-04-08 DIAGNOSIS — I214 Non-ST elevation (NSTEMI) myocardial infarction: Secondary | ICD-10-CM

## 2019-04-08 DIAGNOSIS — I2510 Atherosclerotic heart disease of native coronary artery without angina pectoris: Secondary | ICD-10-CM

## 2019-04-08 HISTORY — PX: CORONARY STENT INTERVENTION W/IMPELLA: CATH118235

## 2019-04-08 HISTORY — PX: CORONARY ATHERECTOMY: CATH118238

## 2019-04-08 LAB — CBC
HCT: 33.3 % — ABNORMAL LOW (ref 36.0–46.0)
HCT: 33.4 % — ABNORMAL LOW (ref 36.0–46.0)
Hemoglobin: 10.5 g/dL — ABNORMAL LOW (ref 12.0–15.0)
Hemoglobin: 10.4 g/dL — ABNORMAL LOW (ref 12.0–15.0)
MCH: 29.4 pg (ref 26.0–34.0)
MCH: 29.5 pg (ref 26.0–34.0)
MCHC: 31.5 g/dL (ref 30.0–36.0)
MCHC: 31.1 g/dL (ref 30.0–36.0)
MCV: 93.3 fL (ref 80.0–100.0)
MCV: 94.9 fL (ref 80.0–100.0)
Platelets: 195 10*3/uL (ref 150–400)
Platelets: 172 10*3/uL (ref 150–400)
RBC: 3.57 MIL/uL — ABNORMAL LOW (ref 3.87–5.11)
RBC: 3.52 MIL/uL — ABNORMAL LOW (ref 3.87–5.11)
RDW: 13 % (ref 11.5–15.5)
RDW: 13.1 % (ref 11.5–15.5)
WBC: 8.9 10*3/uL (ref 4.0–10.5)
WBC: 6.8 10*3/uL (ref 4.0–10.5)
nRBC: 0 % (ref 0.0–0.2)
nRBC: 0 % (ref 0.0–0.2)

## 2019-04-08 LAB — GLUCOSE, CAPILLARY
Glucose-Capillary: 121 mg/dL — ABNORMAL HIGH (ref 70–99)
Glucose-Capillary: 128 mg/dL — ABNORMAL HIGH (ref 70–99)
Glucose-Capillary: 130 mg/dL — ABNORMAL HIGH (ref 70–99)
Glucose-Capillary: 131 mg/dL — ABNORMAL HIGH (ref 70–99)
Glucose-Capillary: 139 mg/dL — ABNORMAL HIGH (ref 70–99)
Glucose-Capillary: 148 mg/dL — ABNORMAL HIGH (ref 70–99)

## 2019-04-08 LAB — BASIC METABOLIC PANEL
Anion gap: 10 (ref 5–15)
BUN: 30 mg/dL — ABNORMAL HIGH (ref 8–23)
CO2: 25 mmol/L (ref 22–32)
Calcium: 8.1 mg/dL — ABNORMAL LOW (ref 8.9–10.3)
Chloride: 106 mmol/L (ref 98–111)
Creatinine, Ser: 1.73 mg/dL — ABNORMAL HIGH (ref 0.44–1.00)
GFR calc Af Amer: 33 mL/min — ABNORMAL LOW (ref >60)
GFR calc non Af Amer: 28 mL/min — ABNORMAL LOW (ref >60)
Glucose, Bld: 134 mg/dL — ABNORMAL HIGH (ref 70–99)
Potassium: 3.6 mmol/L (ref 3.5–5.1)
Sodium: 141 mmol/L (ref 135–145)

## 2019-04-08 LAB — CREATININE, SERUM
Creatinine, Ser: 1.69 mg/dL — ABNORMAL HIGH (ref 0.44–1.00)
GFR calc Af Amer: 34 mL/min — ABNORMAL LOW (ref >60)
GFR calc non Af Amer: 29 mL/min — ABNORMAL LOW (ref >60)

## 2019-04-08 LAB — HEPARIN LEVEL (UNFRACTIONATED): Heparin Unfractionated: 0.33 IU/mL (ref 0.30–0.70)

## 2019-04-08 LAB — POCT ACTIVATED CLOTTING TIME: Activated Clotting Time: 411 seconds

## 2019-04-08 LAB — TRIGLYCERIDES: Triglycerides: 64 mg/dL (ref <150)

## 2019-04-08 SURGERY — CORONARY ATHERECTOMY
Anesthesia: LOCAL

## 2019-04-08 MED ORDER — ASPIRIN EC 81 MG PO TBEC
81.0000 mg | DELAYED_RELEASE_TABLET | Freq: Every day | ORAL | Status: DC
  Administered 2019-04-08 – 2019-04-14 (×7): 81 mg via ORAL
  Filled 2019-04-08 (×7): qty 1

## 2019-04-08 MED ORDER — BIVALIRUDIN TRIFLUOROACETATE 250 MG IV SOLR
INTRAVENOUS | Status: AC
  Filled 2019-04-08: qty 250

## 2019-04-08 MED ORDER — ASPIRIN 81 MG PO CHEW
81.0000 mg | CHEWABLE_TABLET | ORAL | Status: AC
  Administered 2019-04-08: 06:00:00 81 mg via ORAL
  Filled 2019-04-08: qty 1

## 2019-04-08 MED ORDER — SODIUM CHLORIDE 0.9 % IV SOLN: 250.0000 mL | INTRAVENOUS | Status: DC | PRN

## 2019-04-08 MED ORDER — HEPARIN (PORCINE) IN NACL 1000-0.9 UT/500ML-% IV SOLN
INTRAVENOUS | Status: AC
  Filled 2019-04-08: qty 500

## 2019-04-08 MED ORDER — LIDOCAINE HCL (PF) 1 % IJ SOLN
INTRAMUSCULAR | Status: AC
  Filled 2019-04-08: qty 30

## 2019-04-08 MED ORDER — MIDAZOLAM HCL 2 MG/2ML IJ SOLN
INTRAMUSCULAR | Status: AC
  Filled 2019-04-08: qty 2

## 2019-04-08 MED ORDER — NITROGLYCERIN 1 MG/10 ML FOR IR/CATH LAB
INTRA_ARTERIAL | Status: AC
  Filled 2019-04-08: qty 10

## 2019-04-08 MED ORDER — BIVALIRUDIN BOLUS VIA INFUSION - CUPID
INTRAVENOUS | Status: DC | PRN
  Administered 2019-04-08: 14:00:00 69.975 mg via INTRAVENOUS

## 2019-04-08 MED ORDER — SODIUM CHLORIDE 0.9% FLUSH: 3.0000 mL | Freq: Two times a day (BID) | INTRAVENOUS | Status: DC

## 2019-04-08 MED ORDER — HEPARIN (PORCINE) IN NACL 1000-0.9 UT/500ML-% IV SOLN
INTRAVENOUS | Status: DC | PRN
  Administered 2019-04-08: 500 mL

## 2019-04-08 MED ORDER — HEPARIN SODIUM (PORCINE) 5000 UNIT/ML IJ SOLN
5000.0000 [IU] | Freq: Three times a day (TID) | INTRAMUSCULAR | Status: DC
  Administered 2019-04-09 – 2019-04-14 (×16): 5000 [IU] via SUBCUTANEOUS
  Filled 2019-04-08 (×16): qty 1

## 2019-04-08 MED ORDER — IOHEXOL 350 MG/ML SOLN
INTRAVENOUS | Status: DC | PRN
  Administered 2019-04-08: 16:00:00 105 mL via INTRA_ARTERIAL

## 2019-04-08 MED ORDER — SODIUM CHLORIDE 0.9 % IV SOLN
INTRAVENOUS | Status: DC
  Administered 2019-04-08: 06:00:00 via INTRAVENOUS

## 2019-04-08 MED ORDER — LIDOCAINE HCL (PF) 1 % IJ SOLN
INTRAMUSCULAR | Status: DC | PRN
  Administered 2019-04-08: 15 mL

## 2019-04-08 MED ORDER — MORPHINE SULFATE (PF) 2 MG/ML IV SOLN
2.0000 mg | INTRAVENOUS | Status: DC | PRN
  Administered 2019-04-08: 4 mg via INTRAVENOUS
  Filled 2019-04-08: qty 2

## 2019-04-08 MED ORDER — FENTANYL CITRATE (PF) 100 MCG/2ML IJ SOLN
INTRAMUSCULAR | Status: AC
  Filled 2019-04-08: qty 2

## 2019-04-08 MED ORDER — NITROGLYCERIN 1 MG/10 ML FOR IR/CATH LAB
INTRA_ARTERIAL | Status: DC | PRN
  Administered 2019-04-08: 150 ug via INTRACORONARY
  Administered 2019-04-08: 200 ug via INTRACORONARY

## 2019-04-08 MED ORDER — SODIUM CHLORIDE 0.9 % WEIGHT BASED INFUSION: 1.0000 mL/kg/h | INTRAVENOUS | Status: DC

## 2019-04-08 MED ORDER — MIDAZOLAM HCL 2 MG/2ML IJ SOLN
INTRAMUSCULAR | Status: DC | PRN
  Administered 2019-04-08 (×4): 1 mg via INTRAVENOUS

## 2019-04-08 MED ORDER — ONDANSETRON HCL 4 MG/2ML IJ SOLN: 4.0000 mg | Freq: Four times a day (QID) | INTRAMUSCULAR | Status: DC | PRN

## 2019-04-08 MED ORDER — SODIUM CHLORIDE 0.9 % IV SOLN
INTRAVENOUS | Status: DC | PRN
  Administered 2019-04-08 (×2): 1.75 mg/kg/h via INTRAVENOUS

## 2019-04-08 MED ORDER — TICAGRELOR 90 MG PO TABS
90.0000 mg | ORAL_TABLET | Freq: Two times a day (BID) | ORAL | Status: DC
  Administered 2019-04-08 – 2019-04-14 (×12): 90 mg via ORAL
  Filled 2019-04-08 (×12): qty 1

## 2019-04-08 MED ORDER — ACETAMINOPHEN 325 MG PO TABS: 650.0000 mg | ORAL_TABLET | ORAL | Status: DC | PRN

## 2019-04-08 MED ORDER — HEPARIN (PORCINE) IN NACL 1000-0.9 UT/500ML-% IV SOLN
INTRAVENOUS | Status: AC
  Filled 2019-04-08: qty 1000

## 2019-04-08 MED ORDER — SODIUM CHLORIDE 0.9% FLUSH
3.0000 mL | INTRAVENOUS | Status: DC | PRN
  Administered 2019-04-11: 21:00:00 3 mL via INTRAVENOUS
  Filled 2019-04-08: qty 3

## 2019-04-08 MED ORDER — SODIUM CHLORIDE 0.9% FLUSH: 3.0000 mL | INTRAVENOUS | Status: DC | PRN

## 2019-04-08 MED ORDER — LABETALOL HCL 5 MG/ML IV SOLN: 10.0000 mg | INTRAVENOUS | Status: AC | PRN

## 2019-04-08 MED ORDER — TICAGRELOR 90 MG PO TABS
180.0000 mg | ORAL_TABLET | Freq: Once | ORAL | Status: AC
  Administered 2019-04-08: 180 mg via ORAL
  Filled 2019-04-08: qty 2

## 2019-04-08 MED ORDER — FENTANYL CITRATE (PF) 100 MCG/2ML IJ SOLN
INTRAMUSCULAR | Status: DC | PRN
  Administered 2019-04-08 (×5): 25 ug via INTRAVENOUS

## 2019-04-08 MED ORDER — SODIUM CHLORIDE 0.9% FLUSH
3.0000 mL | Freq: Two times a day (BID) | INTRAVENOUS | Status: DC
  Administered 2019-04-09 – 2019-04-14 (×12): 3 mL via INTRAVENOUS

## 2019-04-08 MED ORDER — ATORVASTATIN CALCIUM 40 MG PO TABS
40.0000 mg | ORAL_TABLET | Freq: Every day | ORAL | Status: DC
  Administered 2019-04-08 – 2019-04-14 (×7): 40 mg via ORAL
  Filled 2019-04-08 (×7): qty 1

## 2019-04-08 MED ORDER — HYDRALAZINE HCL 20 MG/ML IJ SOLN: 10.0000 mg | INTRAMUSCULAR | Status: AC | PRN

## 2019-04-08 SURGICAL SUPPLY — 32 items
BALLN SAPPHIRE 3.0X12 (BALLOONS) ×2
BALLN SAPPHIRE ~~LOC~~ 3.25X15 (BALLOONS) ×1 IMPLANT
BALLN ~~LOC~~ SAPPHIRE 4.5X10 (BALLOONS) ×2
BALLOON SAPPHIRE 3.0X12 (BALLOONS) IMPLANT
BALLOON ~~LOC~~ SAPPHIRE 4.5X10 (BALLOONS) IMPLANT
CATH INFINITI 5FR ANG PIGTAIL (CATHETERS) ×1 IMPLANT
CATH VISTA GUIDE 6FR XBLAD3.5 (CATHETERS) ×1 IMPLANT
CROWN DIAMONDBACK CLASSIC 1.25 (BURR) ×1 IMPLANT
DEVICE CLOSURE PERCLS PRGLD 6F (VASCULAR PRODUCTS) IMPLANT
ELECT DEFIB PAD ADLT CADENCE (PAD) ×1 IMPLANT
FEM STOP ARCH (HEMOSTASIS) ×1
HOVERMATT SINGLE USE (MISCELLANEOUS) ×1 IMPLANT
KIT ENCORE 26 ADVANTAGE (KITS) ×1 IMPLANT
KIT HEART LEFT (KITS) ×2 IMPLANT
KIT MICROPUNCTURE NIT STIFF (SHEATH) ×1 IMPLANT
LUBRICANT VIPERSLIDE CORONARY (MISCELLANEOUS) ×2 IMPLANT
PACK CARDIAC CATHETERIZATION (CUSTOM PROCEDURE TRAY) ×2 IMPLANT
PERCLOSE PROGLIDE 6F (VASCULAR PRODUCTS) ×4
SET IMPELLA CP PUMP (CATHETERS) ×1 IMPLANT
SHEATH PINNACLE 6F 10CM (SHEATH) ×1 IMPLANT
SHEATH PINNACLE 7F 10CM (SHEATH) ×2 IMPLANT
SHEATH PINNACLE ST 6F 45CM (SHEATH) ×1 IMPLANT
SHEATH PROBE COVER 6X72 (BAG) ×1 IMPLANT
STENT SYNERGY DES 3X28 (Permanent Stent) ×1 IMPLANT
STENT SYNERGY DES 4X16 (Permanent Stent) ×1 IMPLANT
SYSTEM COMPRESSION FEMOSTOP (HEMOSTASIS) IMPLANT
TRANSDUCER W/STOPCOCK (MISCELLANEOUS) ×2 IMPLANT
TUBING CIL FLEX 10 FLL-RA (TUBING) ×2 IMPLANT
WIRE COUGAR XT STRL 190CM (WIRE) ×1 IMPLANT
WIRE EMERALD 3MM-J .035X150CM (WIRE) ×2 IMPLANT
WIRE LUNDERQUIST .035X180CM (WIRE) ×1 IMPLANT
WIRE VIPERWIRE COR FLEX .012 (WIRE) ×1 IMPLANT

## 2019-04-08 NOTE — Interval H&P Note (Signed)
History and Physical Interval Note:  04/08/2019 1:30 PM  Lori Rivera  has presented today for surgery, with the diagnosis of Unstable Angina.  The various methods of treatment have been discussed with the patient and family. After consideration of risks, benefits and other options for treatment, the patient has consented to  Procedure(s): CORONARY ATHERECTOMY (N/A) Coronary Stent Intervention w/Impella (N/A) as a surgical intervention.  The patient's history has been reviewed, patient examined, no change in status, stable for surgery.  I have reviewed the patient's chart and labs.  Questions were answered to the patient's satisfaction.    Spoke at length with patient and her daughter who is at the bedside. Reviewed procedural plan for Impella supported left main/LAD atherectomy and PTCA/stenting. They understand the increased risk of the procedure in the setting of heart failure, reduced LV function, and chronically occluded RCA. Limited options and she has failed medical therapy. They would like to proceed. She was loaded with ticagrelor this morning.   Sherren Mocha

## 2019-04-08 NOTE — Progress Notes (Signed)
Referred by nurse to go see patient and daughter.  Offered prayer for procedure, which was accepted by Ms. Lori Rivera and her daughter.  Will follow-up as needed.

## 2019-04-08 NOTE — H&P (View-Only) (Signed)
Progress Note  Patient Name: Lori Rivera Date of Encounter: 04/08/2019  Primary Cardiologist: Shirlee More, MD   Subjective   Did well postextubation.  No longer having any chest discomfort.  Breathing comfortably.  Smiling at times but appears tired.  We had long conversation about procedure.  Inpatient Medications    Scheduled Meds:  aspirin EC  81 mg Oral Daily   atorvastatin  40 mg Oral q1800   chlorhexidine  15 mL Mouth Rinse BID   Chlorhexidine Gluconate Cloth  6 each Topical Daily   insulin aspart  1-3 Units Subcutaneous Q4H   mouth rinse  15 mL Mouth Rinse q12n4p   pantoprazole (PROTONIX) IV  40 mg Intravenous QHS   sodium chloride flush  3 mL Intravenous Q12H   Continuous Infusions:  sodium chloride     sodium chloride 50 mL/hr at 04/08/19 0700   heparin 900 Units/hr (04/08/19 0700)   PRN Meds: sodium chloride, docusate, sodium chloride flush   Vital Signs    Vitals:   04/08/19 0400 04/08/19 0500 04/08/19 0600 04/08/19 0700  BP: (!) 104/42 (!) 104/47 (!) 118/51 (!) 114/48  Pulse: 82 78 83 79  Resp: 14 15 16 19   Temp: 98.3 F (36.8 C)   98.6 F (37 C)  TempSrc: Oral   Oral  SpO2: 100% 100% 100% 100%  Weight: 93.3 kg     Height:        Intake/Output Summary (Last 24 hours) at 04/08/2019 0926 Last data filed at 04/08/2019 0700 Gross per 24 hour  Intake 333.21 ml  Output 1435 ml  Net -1101.79 ml   Last 3 Weights 04/08/2019 04/07/2019 04/05/2019  Weight (lbs) 205 lb 11 oz 216 lb 4.3 oz 217 lb 6 oz  Weight (kg) 93.3 kg 98.1 kg 98.6 kg      Telemetry    Sinus rhythm, occasional PVC- Personally Reviewed  ECG    No longer a paced, nonspecific ST changes- Personally Reviewed  Physical Exam   GEN: No acute distress.   Neck: No JVD Cardiac: RRR, no murmurs, rubs, or gallops.  Respiratory: Clear to auscultation bilaterally. GI: Soft, nontender, non-distended  MS: No edema; No deformity. Neuro:  Nonfocal  Psych: Normal affect    Labs    High Sensitivity Troponin:   Recent Labs  Lab 03/31/19 0750 04/07/19 0610 04/07/19 0908  TROPONINIHS 330* 183* 167*      Chemistry Recent Labs  Lab 04/05/19 6294 04/07/19 0356 04/07/19 0610 04/08/19 0222  NA 137 139 139 141  K 4.5 4.9 4.9 3.6  CL 106  --  104 106  CO2 21*  --  26 25  GLUCOSE 195*  --  230* 134*  BUN 32*  --  33* 30*  CREATININE 1.63*  --  1.96* 1.73*  CALCIUM 8.5*  --  8.5* 8.1*  PROT  --   --  5.9*  --   ALBUMIN  --   --  2.8*  --   AST  --   --  16  --   ALT  --   --  15  --   ALKPHOS  --   --  67  --   BILITOT  --   --  0.8  --   GFRNONAA 31*  --  24* 28*  GFRAA 35*  --  28* 33*  ANIONGAP 10  --  9 10     Hematology Recent Labs  Lab 04/05/19 [id number] 04/07/19 0356 04/07/19 0610 04/08/19  0222  WBC 9.2  --  11.7* 8.9  RBC 4.15  --  4.11 3.57*  HGB 12.3 10.9* 12.1 10.5*  HCT 38.5 32.0* 38.3 33.3*  MCV 92.8  --  93.2 93.3  MCH 29.6  --  29.4 29.4  MCHC 31.9  --  31.6 31.5  RDW 12.9  --  12.9 13.0  PLT 168  --  200 195    BNP Recent Labs  Lab 04/03/19 2240  BNP 332.0*     DDimer No results for input(s): DDIMER in the last 168 hours.   Radiology    Dg Chest Port 1 View  Result Date: 04/07/2019 CLINICAL DATA:  73 year old female. Evaluate for endotracheal and enteric tube placement. EXAM: PORTABLE CHEST 1 VIEW COMPARISON:  Earlier radiograph dated 04/06/2019 FINDINGS: Endotracheal tube with tip approximately 4 cm above the carina. Enteric tube extends below the diaphragm with tip beyond the inferior margin of the image. Trace bilateral pleural effusions may be present. There is bibasilar atelectatic changes. Pneumonia is not excluded. Clinical correlation is recommended. No pneumothorax. The cardiac silhouette is within normal limits. Coronary vascular calcification or stent. Left pectoral pacemaker device. No acute osseous pathology. Partially visualized cervical ACDF. IMPRESSION: 1. Endotracheal tube above the carina. Enteric  tube extends below the diaphragm with tip beyond the inferior margin of the image. 2. Bibasilar atelectatic changes. Pneumonia is not excluded. Electronically Signed   By: Anner Crete M.D.   On: 04/07/2019 04:07    Cardiac Studies     1. The left ventricle has moderately reduced systolic function, with an ejection fraction of 35-40%. The cavity size was normal. Left ventricular diastolic Doppler parameters are consistent with impaired relaxation.  2. The right ventricle has normal systolic function. The cavity was normal. There is no increase in right ventricular wall thickness.  3. The mitral valve is abnormal. Moderate thickening of the mitral valve leaflet. Moderate calcification of the mitral valve leaflet.  4. The tricuspid valve is grossly normal.  5. The aortic valve is abnormal. Moderate thickening of the aortic valve. Moderate calcification of the aortic valve. Aortic valve regurgitation is mild by color flow Doppler. Mild stenosis of the aortic valve.  6. The aorta is normal unless otherwise noted.  7. The aortic root and ascending aorta are normal in size and structure.  8. The atrial septum is grossly normal.   Patient Profile     73 y.o. female with flash pulmonary edema in the setting of unstable angina with recent non-ST elevation myocardial infarction and underlying severe coronary artery disease.  Assessment & Plan    Unstable angina - Thankfully she was extubated yesterday without difficulty.  Conversant this morning but tired.  Able to answer all questions without difficulty.  Creatinine down from 2-1.7.  During last admission baseline was approximately 1.6. - I have talked with Dr. Burt Knack of interventional cardiology team again this morning.  I have also talked with her about the upcoming procedure, Impella assisted, rotational atherectomy of left main/LAD.  She understands the risks and benefits including stroke heart attack death further renal impairment and she  is willing to proceed.  Dr. Burt Knack will talk with her to reiterate procedural details and talk to her daughter as well. -I relayed to her that without an attempt at this procedure, it is very likely that she will have another flash pulmonary edema/severe anginal event and may not survive.  Severe CAD - Diagnostic Dominance: Right   Acute kidney injury - Creatinine currently  improving.  Acute respiratory failure - Extubated 8/27, approximately 24 hours on ventilator.  Did well. -Appreciate critical care team  Diabetes with hyperglycemia - Continue with sliding scale insulin appreciate direction of critical care team.  CRITICAL CARE Performed by: Candee Furbish   Total critical care time: 35 minutes  Critical care time was exclusive of separately billable procedures and treating other patients.  Critical care was necessary to treat or prevent imminent or life-threatening deterioration.  Critical care was time spent personally by me on the following activities: development of treatment plan with patient and/or surrogate as well as nursing, discussions with consultants, evaluation of patient's response to treatment, examination of patient, obtaining history from patient or surrogate, ordering and performing treatments and interventions, ordering and review of laboratory studies, ordering and review of radiographic studies, pulse oximetry and re-evaluation of patient's condition.       For questions or updates, please contact [place] Please consult [email or web address] for contact info under        Signed, Candee Furbish, MD  04/08/2019, 9:26 AM

## 2019-04-08 NOTE — Progress Notes (Signed)
NAME:  Lori Rivera, MRN:  [id number], DOB:  08-Oct-1945, LOS: 1 ADMISSION DATE:  04/07/2019, CONSULTATION DATE:  8/27 REFERRING MD: Oval Linsey EDP, CHIEF COMPLAINT:  Chest pain.    Brief History   73 year old female with cardiac history discharged from [facility] 8/25 after admit for NSTEMI. Then 8/26 presented to [facility] ED with CP and SOB. Intubated for hypoxia. Transferred to [facility].    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), Essential hypertension (06/29/2017), Hyperlipidemia (03/07/2015), 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   Significant Hospital Events   8/20 > 8/25 admit for NSTEMI 8/26 admit for CHF, intubated 8/27 extubated.  8/28 sutures removed from left left biopsy site. Awaiting cath lab   Consults:  Cardiology 8/27  Procedures:  ETT 8/27 >  Significant Diagnostic Tests:    Micro Data:     Antimicrobials:     Interim history/subjective:   Feels well. Awaiting cath lab  Objective   Blood pressure (Abnormal) 114/48, pulse 79, temperature 98.6 F (37 C), temperature source Oral, resp. rate 19, height 5\' 7"  (1.702 m), weight 93.3 kg, SpO2 100 %.    Vent Mode: CPAP;PSV FiO2 (%):  [40 %] 40 % PEEP:  [5 cmH20] 5 cmH20 Pressure Support:  [8 cmH20] 8 cmH20 Plateau Pressure:  [13 cmH20] 13 cmH20   Intake/Output Summary (Last 24 hours) at 04/08/2019 0859 Last data filed at 04/08/2019 0700 Gross per 24 hour  Intake 340.91 ml  Output 1435 ml  Net -1094.09 ml   Filed Weights   04/07/19 0307 04/08/19 0400  Weight: 98.1 kg 93.3 kg    Examination:  General frail 73 year old white female resting in bed. Not in acute distress currently  HENT NCAT no JVD. MMM Pulm some scattered rhonchi Card: systolic heart murmur at LSB Ext generalized edema. Brisk CR scattered areas of ecchymosis abd not tender + bowel sounds no OM Neuro awake and oriented.    Resolved Hospital Problem list    Lactic acidosis  Assessment & Plan:   Acute hypoxic respiratory failure in setting of diffuse edema from decompensated HF  pcxr personally reviewed s/p intubation.ETT was in  good position. Infiltrates improved. -appears comfortable on [place] -extubated 8/27 Plan Wean oxygen Mobilize when able Daily assessment for diuretics  Unstable angina in setting of severe 3 vessel CAD; now readmitted w/ Acute on chronic HFrEF:  -h/o VT - Followed by EP, [employer] ICD in place. Plan Cont heparin Cont statin, plavix and aspirin Shooting for cath lab today w/plan for intervention on LAD Will be high risk so keep her in CICU   Acute on chronic renal failure -looks like baseline cr 1.6 to 2 range; sitting at baseline. Hope dye load  doesn't push her over edge Plan Renal dose meds F/u am chem   DM w/ hyperglycemia  Plan ssi  Global: Per P Hoiffman ACNP: Spoke with daughter Joelene Millin on phone.  Tells me that [place] has had DNR established for several years. OK to continue with ETT for now, but no long term. Would not want additional lines or procedures.    Best practice:  Diet: NPO; will start tubefeeds Pain/Anxiety/Delirium protocol (if indicated): Prop per protocol VAP protocol (if indicated): Yes DVT prophylaxis: heparin infusion GI prophylaxis: PPI Glucose control: SSI Mobility: BR Code Status: FULL  Family Communication: Daughter Joelene Millin updated 0400. Disposition: ICU; while awaiting cath lab.       Erick Colace ACNP-BC [place] Pager # [phone] OR # [phone] if no answer

## 2019-04-08 NOTE — Progress Notes (Signed)
Progress Note  Patient Name: Lori Rivera Date of Encounter: 04/08/2019  Primary Cardiologist: Shirlee More, MD   Subjective   Did well postextubation.  No longer having any chest discomfort.  Breathing comfortably.  Smiling at times but appears tired.  We had long conversation about procedure.  Inpatient Medications    Scheduled Meds:  aspirin EC  81 mg Oral Daily   atorvastatin  40 mg Oral q1800   chlorhexidine  15 mL Mouth Rinse BID   Chlorhexidine Gluconate Cloth  6 each Topical Daily   insulin aspart  1-3 Units Subcutaneous Q4H   mouth rinse  15 mL Mouth Rinse q12n4p   pantoprazole (PROTONIX) IV  40 mg Intravenous QHS   sodium chloride flush  3 mL Intravenous Q12H   Continuous Infusions:  sodium chloride     sodium chloride 50 mL/hr at 04/08/19 0700   heparin 900 Units/hr (04/08/19 0700)   PRN Meds: sodium chloride, docusate, sodium chloride flush   Vital Signs    Vitals:   04/08/19 0400 04/08/19 0500 04/08/19 0600 04/08/19 0700  BP: (!) 104/42 (!) 104/47 (!) 118/51 (!) 114/48  Pulse: 82 78 83 79  Resp: 14 15 16 19   Temp: 98.3 F (36.8 C)   98.6 F (37 C)  TempSrc: Oral   Oral  SpO2: 100% 100% 100% 100%  Weight: 93.3 kg     Height:        Intake/Output Summary (Last 24 hours) at 04/08/2019 0926 Last data filed at 04/08/2019 0700 Gross per 24 hour  Intake 333.21 ml  Output 1435 ml  Net -1101.79 ml   Last 3 Weights 04/08/2019 04/07/2019 04/05/2019  Weight (lbs) 205 lb 11 oz 216 lb 4.3 oz 217 lb 6 oz  Weight (kg) 93.3 kg 98.1 kg 98.6 kg      Telemetry    Sinus rhythm, occasional PVC- Personally Reviewed  ECG    No longer a paced, nonspecific ST changes- Personally Reviewed  Physical Exam   GEN: No acute distress.   Neck: No JVD Cardiac: RRR, no murmurs, rubs, or gallops.  Respiratory: Clear to auscultation bilaterally. GI: Soft, nontender, non-distended  MS: No edema; No deformity. Neuro:  Nonfocal  Psych: Normal affect    Labs    High Sensitivity Troponin:   Recent Labs  Lab 03/31/19 0750 04/07/19 0610 04/07/19 0908  TROPONINIHS 330* 183* 167*      Chemistry Recent Labs  Lab 04/05/19 3785 04/07/19 0356 04/07/19 0610 04/08/19 0222  NA 137 139 139 141  K 4.5 4.9 4.9 3.6  CL 106  --  104 106  CO2 21*  --  26 25  GLUCOSE 195*  --  230* 134*  BUN 32*  --  33* 30*  CREATININE 1.63*  --  1.96* 1.73*  CALCIUM 8.5*  --  8.5* 8.1*  PROT  --   --  5.9*  --   ALBUMIN  --   --  2.8*  --   AST  --   --  16  --   ALT  --   --  15  --   ALKPHOS  --   --  67  --   BILITOT  --   --  0.8  --   GFRNONAA 31*  --  24* 28*  GFRAA 35*  --  28* 33*  ANIONGAP 10  --  9 10     Hematology Recent Labs  Lab 04/05/19 [phone] 04/07/19 0356 04/07/19 0610 04/08/19  0222  WBC 9.2  --  11.7* 8.9  RBC 4.15  --  4.11 3.57*  HGB 12.3 10.9* 12.1 10.5*  HCT 38.5 32.0* 38.3 33.3*  MCV 92.8  --  93.2 93.3  MCH 29.6  --  29.4 29.4  MCHC 31.9  --  31.6 31.5  RDW 12.9  --  12.9 13.0  PLT 168  --  200 195    BNP Recent Labs  Lab 04/03/19 2240  BNP 332.0*     DDimer No results for input(s): DDIMER in the last 168 hours.   Radiology    Dg Chest Port 1 View  Result Date: 04/07/2019 CLINICAL DATA:  73 year old female. Evaluate for endotracheal and enteric tube placement. EXAM: PORTABLE CHEST 1 VIEW COMPARISON:  Earlier radiograph dated 04/06/2019 FINDINGS: Endotracheal tube with tip approximately 4 cm above the carina. Enteric tube extends below the diaphragm with tip beyond the inferior margin of the image. Trace bilateral pleural effusions may be present. There is bibasilar atelectatic changes. Pneumonia is not excluded. Clinical correlation is recommended. No pneumothorax. The cardiac silhouette is within normal limits. Coronary vascular calcification or stent. Left pectoral pacemaker device. No acute osseous pathology. Partially visualized cervical ACDF. IMPRESSION: 1. Endotracheal tube above the carina. Enteric  tube extends below the diaphragm with tip beyond the inferior margin of the image. 2. Bibasilar atelectatic changes. Pneumonia is not excluded. Electronically Signed   By: Anner Crete M.D.   On: 04/07/2019 04:07    Cardiac Studies     1. The left ventricle has moderately reduced systolic function, with an ejection fraction of 35-40%. The cavity size was normal. Left ventricular diastolic Doppler parameters are consistent with impaired relaxation.  2. The right ventricle has normal systolic function. The cavity was normal. There is no increase in right ventricular wall thickness.  3. The mitral valve is abnormal. Moderate thickening of the mitral valve leaflet. Moderate calcification of the mitral valve leaflet.  4. The tricuspid valve is grossly normal.  5. The aortic valve is abnormal. Moderate thickening of the aortic valve. Moderate calcification of the aortic valve. Aortic valve regurgitation is mild by color flow Doppler. Mild stenosis of the aortic valve.  6. The aorta is normal unless otherwise noted.  7. The aortic root and ascending aorta are normal in size and structure.  8. The atrial septum is grossly normal.   Patient Profile     73 y.o. female with flash pulmonary edema in the setting of unstable angina with recent non-ST elevation myocardial infarction and underlying severe coronary artery disease.  Assessment & Plan    Unstable angina - Thankfully she was extubated yesterday without difficulty.  Conversant this morning but tired.  Able to answer all questions without difficulty.  Creatinine down from 2-1.7.  During last admission baseline was approximately 1.6. - I have talked with Dr. Burt Knack of interventional cardiology team again this morning.  I have also talked with her about the upcoming procedure, Impella assisted, rotational atherectomy of left main/LAD.  She understands the risks and benefits including stroke heart attack death further renal impairment and she  is willing to proceed.  Dr. Burt Knack will talk with her to reiterate procedural details and talk to her daughter as well. -I relayed to her that without an attempt at this procedure, it is very likely that she will have another flash pulmonary edema/severe anginal event and may not survive.  Severe CAD - Diagnostic Dominance: Right   Acute kidney injury - Creatinine currently  improving.  Acute respiratory failure - Extubated 8/27, approximately 24 hours on ventilator.  Did well. -Appreciate critical care team  Diabetes with hyperglycemia - Continue with sliding scale insulin appreciate direction of critical care team.  CRITICAL CARE Performed by: Candee Furbish   Total critical care time: 35 minutes  Critical care time was exclusive of separately billable procedures and treating other patients.  Critical care was necessary to treat or prevent imminent or life-threatening deterioration.  Critical care was time spent personally by me on the following activities: development of treatment plan with patient and/or surrogate as well as nursing, discussions with consultants, evaluation of patient's response to treatment, examination of patient, obtaining history from patient or surrogate, ordering and performing treatments and interventions, ordering and review of laboratory studies, ordering and review of radiographic studies, pulse oximetry and re-evaluation of patient's condition.       For questions or updates, please contact [place] Please consult [email or web address] for contact info under        Signed, Candee Furbish, MD  04/08/2019, 9:26 AM

## 2019-04-08 NOTE — Progress Notes (Signed)
eLink Physician-Brief Progress Note Patient Name: Lori Rivera DOB: Feb 03, 1946 MRN: [id number]   Date of Service  04/08/2019  HPI/Events of Note  Pt needs foley catheter for close monitoring of urine output  eICU Interventions  Order for foley catheter entered.        Kerry Kass Ogan 04/08/2019, 12:18 AM

## 2019-04-08 NOTE — Progress Notes (Signed)
[facility] for Heparin Indication: chest pain/ACS  Allergies  Allergen Reactions  . Codeine Other (See Comments)    confused  . Penicillins Rash    Did it involve swelling of the face/tongue/throat, SOB, or low BP? Yes Did it involve sudden or severe rash/hives, skin peeling, or any reaction on the inside of your mouth or nose? No Did you need to seek medical attention at a hospital or doctor's office? Yes When did it last happen?20 yrs ago If all above answers are "NO", may proceed with cephalosporin use.     Patient Measurements: Height: 5\' 7"  (170.2 cm) Weight: 205 lb 11 oz (93.3 kg) IBW/kg (Calculated) : 61.6 Heparin Dosing Weight: 83.3 kg  Vital Signs: Temp: 98.6 F (37 C) (08/28 0700) Temp Source: Oral (08/28 0700) BP: 114/48 (08/28 0700) Pulse Rate: 79 (08/28 0700)  Labs: Recent Labs    04/07/19 0356 04/07/19 0610 04/07/19 0908 04/07/19 1123 04/07/19 1908 04/08/19 0222  HGB 10.9* 12.1  --   --   --  10.5*  HCT 32.0* 38.3  --   --   --  33.3*  PLT  --  200  --   --   --  195  APTT  --  110*  --   --   --   --   LABPROT  --  15.1  --   --   --   --   INR  --  1.2  --   --   --   --   HEPARINUNFRC  --   --   --  0.46 0.49 0.33  CREATININE  --  1.96*  --   --   --  1.73*  TROPONINIHS  --  183* 167*  --   --   --     Estimated Creatinine Clearance: 33 mL/min (A) (by C-G formula based on SCr of 1.73 mg/dL (H)).   Medical History: Past Medical History:  Diagnosis Date  . Aortic regurgitation 09/24/2017  . Arthritis 09/24/2017  . CAD in native artery 03/07/2015   Overview:  1. Out of hospital cardiac arrest Aug 2016 with MI, shock, PCI and 2 Xience DES to mid and distal culprit RCA with diffuse LAD disease and hypothermia protocol 2. NonSTEMI,05/27/15  Conclusions: 1. Pt was turned down by CTS due to co-morbidities. Pt is for PCI to multiple lesions on LAD and RCA. Ramus is small, will treat medically. 2. Successful  PCI / Xience Drug Eluting Stent of the m  . Chest pain 08/08/2015  . CHF (congestive heart failure) ([place]) 09/24/2017  . Chronic combined systolic and diastolic heart failure ([place]) 08/19/2015   Overview:  Echo 08/03/15: Left Ventricle Normal LV chamber size. Normal LV wall thickness. Mildly reduced overall LV systolic function. Estimated LVEF 45% Inferior, posterior and inferoseptal hypokinesis. Abnormal LV diastolic function, Grade 2, with echo evidence of elevated LA pressure.  Overview:  MUGA 20% 09/03/16 Overview:  Echo 08/03/15: Left Ventricle Normal LV chamber size. Normal LV wall   . Chronic systolic heart failure ([place]) 06/07/2015  . CKD (chronic kidney disease) stage 3, GFR 30-59 ml/min (HCC) 06/07/2015  . Coronary artery disease involving native heart with angina pectoris ([place]) 06/29/2017   Overview:  Added automatically from request for surgery [id number]  . 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 Essential hypertension 06/29/2017  . Hyperlipidemia 03/07/2015  . 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 Ischemic cardiomyopathy 11/18/2015   Overview:  EF 20-25%  . NSTEMI (non-ST elevated myocardial infarction) ([place]) 05/25/2015   Overview:  Cardiac cath 05/28/15 :Conclusions 1. Pt was turned down by CTS due to co-morbidities. Pt is for PCI to multiple lesions on LAD and RCA. Ramus is small, will treat medically. 2. Successful PCI / Xience Drug Eluting Stent of the mid and distal Left Anterior Descending Coronary Artery. 3. Successful PCI / Xience Drug Eluting Stent of the ostial and mid Right Coronary Artery. 4. POBA to Di  . PNA (pneumonia) 08/08/2015  . Pneumonia due to infectious organism 08/19/2015   Overview:  Discharge summary 08/11/15: Her chest x-ray showed airspace opacities concerning for multifocal pneumonia and she will be discharged on oral Levaquin. Follow-up chest x-ray is  recommended to ensure resolution.  . Shortness of breath 09/24/2017  . ST elevation myocardial infarction involving right coronary artery ([place]) 08/03/2015   Overview:  Due to ISR and acute stent thrombosis Cath 08/03/15: Diagnostic procedure: Left Heart Cath PCI procedure: Stent DES, Thrombectomy Complications: No Complications. Conclusions Diagnostic Summary Acute occlusion of RCA ostial stent thrombosis Severe stenosis of the mid RCA, partially ISR Patent LAD stent CTO of small circ system Interventional Summary Successful Thrombectomy/PTCA of the o  . STEMI (ST elevation myocardial infarction) ([place]) 08/03/2015  . Type 2 diabetes mellitus, without long-term current use of insulin ([place]) 03/07/2015  . Vitamin B 12 deficiency 09/24/2017  . Vitamin D deficiency 09/24/2017    Medications:  Infusions:  . sodium chloride    . sodium chloride 50 mL/hr at 04/08/19 0700  . heparin 900 Units/hr (04/08/19 0700)    Assessment: 73 YOF transferred from [facility] after presenting with CP and SOB. Heparin was already running at 900 units/hr when she arrived. Pharmacy has been consulted to dose heparin.  She was recently admitted for NSTEMI on 8/20. During this admission, she was therapeutic on a drip rate of 900 units/hr.  Heparin level is therapeutic x3 on 900 units/hr. Hgb low at 10.5, plts wnl. No reports of bleeding per RN. Planning cath for Impella-assisted high-risk PCI later today.  Goal of Therapy:  Heparin level 0.3-0.7 units/ml Monitor platelets by anticoagulation protocol: Yes   Plan:  - Continue heparin infusion at 900 units/hr - Daily heparin level and CBC - Follow-up anticoagulation plans post-cath  Richardine Service, PharmD PGY1 Pharmacy Resident Phone: [phone] 04/08/2019  8:44 AM  Please check [email or web address] for unit-specific pharmacy phone numbers.

## 2019-04-09 DIAGNOSIS — N179 Acute kidney failure, unspecified: Secondary | ICD-10-CM

## 2019-04-09 DIAGNOSIS — I255 Ischemic cardiomyopathy: Secondary | ICD-10-CM

## 2019-04-09 LAB — BASIC METABOLIC PANEL
Anion gap: 10 (ref 5–15)
BUN: 26 mg/dL — ABNORMAL HIGH (ref 8–23)
CO2: 23 mmol/L (ref 22–32)
Calcium: 8.2 mg/dL — ABNORMAL LOW (ref 8.9–10.3)
Chloride: 103 mmol/L (ref 98–111)
Creatinine, Ser: 1.56 mg/dL — ABNORMAL HIGH (ref 0.44–1.00)
GFR calc Af Amer: 37 mL/min — ABNORMAL LOW (ref >60)
GFR calc non Af Amer: 32 mL/min — ABNORMAL LOW (ref >60)
Glucose, Bld: 286 mg/dL — ABNORMAL HIGH (ref 70–99)
Potassium: 4.3 mmol/L (ref 3.5–5.1)
Sodium: 136 mmol/L (ref 135–145)

## 2019-04-09 LAB — CBC
HCT: 31.6 % — ABNORMAL LOW (ref 36.0–46.0)
Hemoglobin: 10.1 g/dL — ABNORMAL LOW (ref 12.0–15.0)
MCH: 29.6 pg (ref 26.0–34.0)
MCHC: 32 g/dL (ref 30.0–36.0)
MCV: 92.7 fL (ref 80.0–100.0)
Platelets: 181 10*3/uL (ref 150–400)
RBC: 3.41 MIL/uL — ABNORMAL LOW (ref 3.87–5.11)
RDW: 13 % (ref 11.5–15.5)
WBC: 6.6 10*3/uL (ref 4.0–10.5)
nRBC: 0 % (ref 0.0–0.2)

## 2019-04-09 LAB — GLUCOSE, CAPILLARY
Glucose-Capillary: 129 mg/dL — ABNORMAL HIGH (ref 70–99)
Glucose-Capillary: 136 mg/dL — ABNORMAL HIGH (ref 70–99)
Glucose-Capillary: 168 mg/dL — ABNORMAL HIGH (ref 70–99)
Glucose-Capillary: 200 mg/dL — ABNORMAL HIGH (ref 70–99)
Glucose-Capillary: 236 mg/dL — ABNORMAL HIGH (ref 70–99)
Glucose-Capillary: 304 mg/dL — ABNORMAL HIGH (ref 70–99)

## 2019-04-09 MED ORDER — FUROSEMIDE 10 MG/ML IJ SOLN
20.0000 mg | Freq: Once | INTRAMUSCULAR | Status: AC
  Administered 2019-04-09: 11:00:00 20 mg via INTRAVENOUS
  Filled 2019-04-09: qty 2

## 2019-04-09 MED ORDER — DOCUSATE SODIUM 100 MG PO CAPS: 100.0000 mg | ORAL_CAPSULE | Freq: Two times a day (BID) | ORAL | Status: DC | PRN

## 2019-04-09 NOTE — Plan of Care (Signed)

## 2019-04-09 NOTE — Progress Notes (Addendum)
NAME:  Lori Rivera, MRN:  [id number], DOB:  07-22-1946, LOS: 2 ADMISSION DATE:  04/07/2019, CONSULTATION DATE:  8/27 REFERRING MD: Oval Linsey EDP, CHIEF COMPLAINT:  Chest pain.    Brief History   73 year old female with cardiac history discharged from [facility] 8/25 after admit for NSTEMI. Then 8/26 presented to [facility] ED with CP and SOB. Intubated for hypoxia. Transferred to [facility].    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 (04/15/2017), Essential hypertension (06/29/2017), Hyperlipidemia (03/07/2015), 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) (03/07/2015), Vitamin B 12 deficiency (09/24/2017), and Vitamin D deficiency (09/24/2017).   Significant Hospital Events   8/20 > 8/25 admit for NSTEMI 8/26 admit for CHF, intubated 8/27 extubated.  8/28 sutures removed from left left biopsy site. Awaiting cath lab  - loaded with Ticagrelor. Plan for Impella supported left main/LAD atherectomy and PTCA/stenting  Consults:   Cardiology 8/27  Procedures:  ETT 8/27 > 8/28  Significant Diagnostic Tests:    Micro Data:    Antimicrobials:     Interim history/subjective:   8/29 - reamins extubated. S/p cath and stent yesterday. Doing well per RN. Sitting in chair. Pulse ox 100% on 2L [place]. RN feels patient can be transferred out., Anasarca +. Creat better.    Objective   Blood pressure (!) 115/99, pulse 92, temperature 97.6 F (36.4 C), temperature source Oral, resp. rate 19, height 5\' 7"  (1.702 m), weight 95.9 kg, SpO2 100 %.        Intake/Output Summary (Last 24 hours) at 04/09/2019 0954 Last data filed at 04/09/2019 0800 Gross per 24 hour  Intake 1470.42 ml  Output 1090 ml  Net 380.42 ml   Filed Weights   04/07/19 0307 04/08/19 0400 04/09/19 0500  Weight: 98.1 kg 93.3 kg 95.9 kg    Examination:  General Appearance:  Looks deconditioned. Sitting in chair.  Head:  Normocephalic, without obvious abnormality, atraumatic Eyes:  PERRL - yes, conjunctiva/corneas - muddy     Ears:  Normal external ear canals, both ears Nose:  G tube - no but has [place] 2L  Throat:  ETT TUBE - no , OG tube - no Neck:  Supple,  No enlargement/tenderness/nodules Lungs: Clear to auscultation bilaterally, Heart:  S1 and S2 normal, no murmur, CVP - no.  Pressors - no Abdomen:  Soft, no masses, no organomegaly Genitalia / Rectal:  Not done Extremities:  Extremities- edema + Skin:  ntact in exposed areas . Sacral area - not examined Neurologic:  Sedation - none -> RASS - +1 . Moves all 4s - yes. CAM-ICU - neg . Orientation - x3+       Resolved Hospital Problem list    Lactic acidosis  Assessment & Plan:   Acute hypoxic respiratory failure in setting of diffuse edema from  decompensated HF   04/09/2019 - down to 2 L N C but probably can be weaned off   Plan WEan o2   Unstable angina in setting of severe 3 vessel CAD; now readmitted w/ Acute on chronic HFrEF:  -h/o VT - Followed by EP, Biotronik ICD in place.   04/09/2019  S/ stent  Plan Cont heparin S! Cont statin,  brilinta and aspirin     Acute on chronic renal failure -looks like baseline cr 1.6 to 2 range; sitting at baseline. Hope dye load doesn't push her over edge  04/09/2019 - anasarca +   Plan Renal dose meds F/u am chem Lasix x 1   DM w/ hyperglycemia  Plan ssi  Deconditioned  - PT /OT consult 04/10/19 - OOB to cair   Global:  DNR but continue active medical care   Best practice:  Diet: NPO; will start tubefeeds Pain/Anxiety/Delirium protocol (if indicated): dc morphine prn VAP protocol (if indicated): Yes DVT prophylaxis: heparin s1GI prophylaxis: PPI Glucose control: SSI Mobility: BR Code Status: FULL  Family Communication: Daughter Joelene Millin updated 10:11 AM 04/09/2019  Disposition: Ok to go to [facility] from ccm perspective + if cardiology ok with it after rounds. Also d/w Dr Cooper-> patient best served with [facility] PRimary from 04/10/19\  -> dw Dr Samantha Crimes -> [facility] primary from 04/10/19   ATTESTATION & SIGNATURE   Dr. Brand Males, M.D., [facility].C.P Pulmonary and Critical Care Medicine Staff Physician [facility] Pager: [phone], If no answer or between  15:00h - 7:00h: call [phone]  04/09/2019 9:54 AM   LABS    PULMONARY Recent Labs  Lab 04/07/19 0356  PHART 7.424  PCO2ART 38.4  PO2ART 197.0*  HCO3 25.2  TCO2 26  O2SAT 100.0    CBC Recent Labs  Lab 04/07/19 0610 04/08/19 0222 04/08/19 1712  HGB 12.1 10.5* 10.4*  HCT 38.3 33.3* 33.4*  WBC 11.7* 8.9 6.8  PLT 200 195 172    COAGULATION Recent Labs  Lab 04/07/19 0610  INR 1.2    CARDIAC  No results for input(s): TROPONINI in the last 168 hours. No results for input(s): PROBNP in the last 168 hours.   CHEMISTRY Recent Labs  Lab 04/03/19 1946 04/04/19 0231 04/05/19 3299 04/07/19 0356 04/07/19 0610 04/08/19 0222 04/08/19 1712  NA 134* 137 137 139 139 141  --    K 4.8 4.7 4.5 4.9 4.9 3.6  --   CL 104 106 106  --  104 106  --   CO2 21* 24 21*  --  26 25  --   GLUCOSE 210* 239* 195*  --  230* 134*  --   BUN 33* 35* 32*  --  33* 30*  --   CREATININE 2.29* 2.13* 1.63*  --  1.96* 1.73* 1.69*  CALCIUM 8.4* 8.4* 8.5*  --  8.5* 8.1*  --   MG  --   --   --   --  2.0  --   --   PHOS  --   --   --   --  4.3  --   --    Estimated Creatinine Clearance: 34.2 mL/min (A) (by C-G formula based on SCr of 1.69 mg/dL (H)).   LIVER Recent Labs  Lab 04/07/19 0610  AST 16  ALT 15  ALKPHOS 67  BILITOT 0.8  PROT 5.9*  ALBUMIN 2.8*  INR 1.2     INFECTIOUS Recent Labs  Lab 04/07/19 0610 04/07/19 0908  LATICACIDVEN 2.1* 2.0*  PROCALCITON 0.21  --      ENDOCRINE CBG (last 3)  Recent Labs    04/09/19 0026 04/09/19 0437 04/09/19 0758  GLUCAP 168* 129* 136*         IMAGING x48h  - image(s) personally visualized  -   highlighted in bold No results found.

## 2019-04-09 NOTE — Progress Notes (Signed)
Progress Note   Subjective   Doing well today, the patient denies CP or SOB.  No new concerns  Inpatient Medications    Scheduled Meds: . aspirin EC  81 mg Oral Daily  . atorvastatin  40 mg Oral q1800  . chlorhexidine  15 mL Mouth Rinse BID  . Chlorhexidine Gluconate Cloth  6 each Topical Daily  . heparin  5,000 Units Subcutaneous Q8H  . insulin aspart  1-3 Units Subcutaneous Q4H  . mouth rinse  15 mL Mouth Rinse q12n4p  . pantoprazole (PROTONIX) IV  40 mg Intravenous QHS  . sodium chloride flush  3 mL Intravenous Q12H  . ticagrelor  90 mg Oral BID   Continuous Infusions: . sodium chloride     PRN Meds: sodium chloride, docusate, ondansetron (ZOFRAN) IV, sodium chloride flush   Vital Signs    Vitals:   04/09/19 0600 04/09/19 0700 04/09/19 0800 04/09/19 1000  BP: (!) 107/38 (!) 113/45 (!) 115/99 (!) 115/49  Pulse: 76 76 92 89  Resp: 13 13 19 17   Temp:  97.6 F (36.4 C)    TempSrc:  Oral    SpO2: 100% 100% 100% 100%  Weight:      Height:        Intake/Output Summary (Last 24 hours) at 04/09/2019 1143 Last data filed at 04/09/2019 0800 Gross per 24 hour  Intake 1353.58 ml  Output 1090 ml  Net 263.58 ml   Filed Weights   04/07/19 0307 04/08/19 0400 04/09/19 0500  Weight: 98.1 kg 93.3 kg 95.9 kg    Telemetry    sinus - Personally Reviewed  Physical Exam   GEN- The patient is elderly appearing, alert and oriented x 3 today.   Head- normocephalic, atraumatic Eyes-  Sclera clear, conjunctiva pink Ears- hearing intact Oropharynx- clear Neck- supple, Lungs-  normal work of breathing Heart- Regular rate and rhythm  GI- soft, NT, ND, + BS Extremities- no clubbing, cyanosis, + dependant edema  MS- diffuse atrophy Skin- no rash or lesion Psych- euthymic mood, full affect Neuro- strength and sensation are intact   Labs    Chemistry Recent Labs  Lab 04/05/19 0632 04/07/19 0356 04/07/19 0610 04/08/19 0222 04/08/19 1712  NA 137 139 139 141  --    K 4.5 4.9 4.9 3.6  --   CL 106  --  104 106  --   CO2 21*  --  26 25  --   GLUCOSE 195*  --  230* 134*  --   BUN 32*  --  33* 30*  --   CREATININE 1.63*  --  1.96* 1.73* 1.69*  CALCIUM 8.5*  --  8.5* 8.1*  --   PROT  --   --  5.9*  --   --   ALBUMIN  --   --  2.8*  --   --   AST  --   --  16  --   --   ALT  --   --  15  --   --   ALKPHOS  --   --  67  --   --   BILITOT  --   --  0.8  --   --   GFRNONAA 31*  --  24* 28* 29*  GFRAA 35*  --  28* 33* 34*  ANIONGAP 10  --  9 10  --      Hematology Recent Labs  Lab 04/07/19 0610 04/08/19 0222 04/08/19 1712  WBC 11.7* 8.9  6.8  RBC 4.11 3.57* 3.52*  HGB 12.1 10.5* 10.4*  HCT 38.3 33.3* 33.4*  MCV 93.2 93.3 94.9  MCH 29.4 29.4 29.5  MCHC 31.6 31.5 31.1  RDW 12.9 13.0 13.1  PLT 200 195 172   Patient Profile     73 y.o. female with flash pulmonary edema in the setting of unstable angina with recent non-ST elevation myocardial infarction and underlying severe coronary artery disease.  S/p successful atherectomy and stenting of the proximal LAD and left mainstem with impella support by Dr Burt Knack 04/08/2019.  Assessment & Plan    1.  CAD with unstable angina/ acute decompensation/ flash pulmonary edema The patient has extensive CAD and flash pulmonary edema. S/p successful atherectomy and stenting of the proximal LAD and left mainstem with impella support yesterday. Continue ASA and ticagrelor x 12 months Continue aggressive medical therapy  2. Acute kidney injury Creatinine is slightly improved Caution with diuresis  3. Acute respiratory failure/ flash pulmonary edema She has ongoing volume overload Now extubated x 48 hours and making some improvement Weaning O2 as ablve  4. Diabetes Stable No change required today  5. Acute on chronic systolic / ischemic CM She has ongoing volume overload Diuresis is limited by renal failure Would try to keep Is and Os about even to slightly negative She has an ICD in place with  narrow qrs  6. Hypertensive cardiorenal disease with CHF Stable No change required today  Ok to transfer to telemetry today  Thompson Grayer MD, [facility] 04/09/2019 11:43 AM

## 2019-04-09 NOTE — Progress Notes (Signed)
CARDIAC REHAB PHASE I   PRE:  Rate/Rhythm: 95 SR    BP: sitting 105/39    SaO2: 100 RA  MODE:  Ambulation: 60 ft in hall   POST:  Rate/Rhythm: 121 ST    BP: sitting 132/46     SaO2: 100 RA  Pt very weak. Initially stood with max assist x2 and gait belt and she became anxious and felt like she was falling so sat back down. HR up to 121 ST. Pt calmed down and was willing to try again. Able to stand with mod assist x2 and gait belt. Walked with RW, assist x2 in hall, slow pace. HR slowly up to 121 ST. Return to recliner. She would benefit from PT c/s for strengthening and d/c planning (lives alone with daughter next door). [place], ACSM 04/09/2019 11:32 AM

## 2019-04-09 NOTE — Progress Notes (Signed)
Patient arrived to unit, says she feels much better. She came up in a wheelchair and transferred to bed with one assist. VSS. Will continue to monitor.

## 2019-04-10 DIAGNOSIS — N171 Acute kidney failure with acute cortical necrosis: Secondary | ICD-10-CM

## 2019-04-10 LAB — CBC
HCT: 32.2 % — ABNORMAL LOW (ref 36.0–46.0)
Hemoglobin: 10.2 g/dL — ABNORMAL LOW (ref 12.0–15.0)
MCH: 29.5 pg (ref 26.0–34.0)
MCHC: 31.7 g/dL (ref 30.0–36.0)
MCV: 93.1 fL (ref 80.0–100.0)
Platelets: 178 10*3/uL (ref 150–400)
RBC: 3.46 MIL/uL — ABNORMAL LOW (ref 3.87–5.11)
RDW: 12.8 % (ref 11.5–15.5)
WBC: 7.6 10*3/uL (ref 4.0–10.5)
nRBC: 0 % (ref 0.0–0.2)

## 2019-04-10 LAB — GLUCOSE, CAPILLARY
Glucose-Capillary: 149 mg/dL — ABNORMAL HIGH (ref 70–99)
Glucose-Capillary: 153 mg/dL — ABNORMAL HIGH (ref 70–99)
Glucose-Capillary: 184 mg/dL — ABNORMAL HIGH (ref 70–99)
Glucose-Capillary: 209 mg/dL — ABNORMAL HIGH (ref 70–99)
Glucose-Capillary: 217 mg/dL — ABNORMAL HIGH (ref 70–99)
Glucose-Capillary: 252 mg/dL — ABNORMAL HIGH (ref 70–99)

## 2019-04-10 LAB — PHOSPHORUS: Phosphorus: 3 mg/dL (ref 2.5–4.6)

## 2019-04-10 LAB — LACTIC ACID, PLASMA: Lactic Acid, Venous: 1.1 mmol/L (ref 0.5–1.9)

## 2019-04-10 LAB — MAGNESIUM: Magnesium: 1.7 mg/dL (ref 1.7–2.4)

## 2019-04-10 MED ORDER — METOPROLOL TARTRATE 25 MG PO TABS
25.0000 mg | ORAL_TABLET | Freq: Four times a day (QID) | ORAL | Status: DC
  Administered 2019-04-10 – 2019-04-11 (×4): 25 mg via ORAL
  Filled 2019-04-10 (×4): qty 1

## 2019-04-10 MED ORDER — NITROGLYCERIN IN D5W 200-5 MCG/ML-% IV SOLN
0.0000 ug/min | INTRAVENOUS | Status: DC
  Administered 2019-04-10: 5 ug/min via INTRAVENOUS
  Administered 2019-04-10: 10:00:00 10 ug/min via INTRAVENOUS
  Administered 2019-04-10: 5 ug/min via INTRAVENOUS
  Filled 2019-04-10: qty 250

## 2019-04-10 MED ORDER — NITROGLYCERIN 0.4 MG SL SUBL
0.4000 mg | SUBLINGUAL_TABLET | SUBLINGUAL | Status: DC | PRN
  Administered 2019-04-10 – 2019-04-11 (×4): 0.4 mg via SUBLINGUAL
  Filled 2019-04-10 (×2): qty 1

## 2019-04-10 MED ORDER — INSULIN ASPART 100 UNIT/ML ~~LOC~~ SOLN
0.0000 [IU] | Freq: Every day | SUBCUTANEOUS | Status: DC
  Administered 2019-04-10: 2 [IU] via SUBCUTANEOUS
  Administered 2019-04-10: 5 [IU] via SUBCUTANEOUS

## 2019-04-10 MED ORDER — METOPROLOL TARTRATE 5 MG/5ML IV SOLN
5.0000 mg | INTRAVENOUS | Status: AC | PRN
  Administered 2019-04-10 (×2): 5 mg via INTRAVENOUS
  Filled 2019-04-10 (×2): qty 5

## 2019-04-10 MED ORDER — INSULIN ASPART 100 UNIT/ML ~~LOC~~ SOLN
0.0000 [IU] | Freq: Three times a day (TID) | SUBCUTANEOUS | Status: DC
  Administered 2019-04-10: 18:00:00 2 [IU] via SUBCUTANEOUS
  Administered 2019-04-11: 17:00:00 3 [IU] via SUBCUTANEOUS
  Administered 2019-04-11: 2 [IU] via SUBCUTANEOUS
  Administered 2019-04-11: 3 [IU] via SUBCUTANEOUS
  Administered 2019-04-12: 5 [IU] via SUBCUTANEOUS
  Administered 2019-04-12: 3 [IU] via SUBCUTANEOUS
  Administered 2019-04-12 – 2019-04-13 (×2): 2 [IU] via SUBCUTANEOUS
  Administered 2019-04-13: 3 [IU] via SUBCUTANEOUS
  Administered 2019-04-13: 12:00:00 5 [IU] via SUBCUTANEOUS
  Administered 2019-04-14: 2 [IU] via SUBCUTANEOUS
  Administered 2019-04-14: 3 [IU] via SUBCUTANEOUS
  Administered 2019-04-14: 5 [IU] via SUBCUTANEOUS

## 2019-04-10 MED ORDER — NITROGLYCERIN 0.4 MG SL SUBL
SUBLINGUAL_TABLET | SUBLINGUAL | Status: AC
  Administered 2019-04-10: 0.4 mg
  Filled 2019-04-10: qty 1

## 2019-04-10 MED ORDER — HYDROMORPHONE HCL 1 MG/ML IJ SOLN
0.2500 mg | Freq: Once | INTRAMUSCULAR | Status: AC
  Administered 2019-04-10: 0.25 mg via INTRAVENOUS
  Filled 2019-04-10: qty 0.5

## 2019-04-10 NOTE — Progress Notes (Signed)
Patient with CP 4/10, EKG done and provider notified.

## 2019-04-10 NOTE — Progress Notes (Signed)
Cardiology NP-  Reassessed patient-  Will add Lopressor to decrease HR.  3 x q 5 minutes.    10:20: cardiology came by to assess patient.  Pain is resolving.  Will keep nitro drip at rate of 3 mcg/min and continue metoprolol.  Monitoring BP closely. If improves after a couple hours - will start to taper nitro.

## 2019-04-10 NOTE — Progress Notes (Signed)
Patient wanted to speak with daughter- called Mrs. Adams on hospital phone.  Patient was able to speak to her daughter

## 2019-04-10 NOTE — Progress Notes (Addendum)
Patient stated having 6 out of 10 chest discomfort.  EKG obtain, vitals collected. MD ([facility] and cardiology ) aware.   Administered 3x SL nitro.  1st SL-  No relief 2nd SL- slight relief After 3rd BP dropped to 98/48.  Pt stated pain dropped to 3.     Cards is aware will continue to monitor.  Set vitals sequence at q 15 minters

## 2019-04-10 NOTE — Progress Notes (Signed)
PROGRESS NOTE    Lori Rivera  ZDG:[id number] DOB: January 17, 1946 DOA: 04/07/2019 PCP: Garwin Brothers, MD  Outpatient Specialists:   Brief Narrative:  Patient is a 73 year old female with extensive cardiac history.  Patient carries diagnosis of diabetes mellitus type 2, STEMI, and STEMI, ischemic cardiomyopathy, hypertension, CKD stage III, combined systolic and diastolic congestive heart failure and aortic regurgitation.  Patient presented to [facility] with chest pain and shortness of breath.  Patient was noted to be hypoxemic respiratory failure was transferred to [facility].  Patient was admitted under the ICU team and intubated.  Cardiology team directed cardiac care.  Patient underwent coronary angiography that revealed multivessel disease.  After due consideration, decision was taken to proceed with atherectomy and PCI rather than CABG.  Patient was transferred out of the ICU, and the hospitalist team has assumed primary care.  Cardiology team is directing cardiac management.  Patient developed chest pain today, and is currently on nitro drip.  Input from Dr. Rayann Heman, cardiologist is highly appreciated.   Assessment & Plan:   Active Problems:   Acute on chronic systolic heart failure (HCC)   Acute respiratory failure with hypoxia (HCC)   Cardiogenic pulmonary edema (HCC)  Acute hypoxic respiratory failure secondary to decompensated congestive heart failure: Resolved significantly. Patient has been extubated and transferred out of ICU. Congestive heart failure management is being directed by the cardiology team.  Unstable angina with severe three-vessel coronary artery disease: Patient has undergone atherectomy and PCI. Chest pain reported today. Patient is currently on nitro drip as well. Cardiology is directing care.  Diabetes mellitus: Blood sugar is reasonably controlled. Sliding scale insulin coverage.  DVT prophylaxis: Subacute heparin Code Status: Partial  Family Communication: Daughter Disposition Plan: This will depend on hospital course   Consultants:   Cardiology  Procedures:   Patient has undergone atherectomy and PCI  Antimicrobials:   None   Subjective: Patient was having chest pain earlier today, but none for now. She is currently on nitro drip.  Objective: Vitals:   04/10/19 0957 04/10/19 1002 04/10/19 1007 04/10/19 1014  BP: (!) 153/67 (!) 154/67 (!) 148/59 (!) 128/53  Pulse: (!) 110 (!) 110 (!) 110 (!) 101  Resp:      Temp:      TempSrc:      SpO2: 99% 98% 97% 97%  Weight:      Height:        Intake/Output Summary (Last 24 hours) at 04/10/2019 1020 Last data filed at 04/10/2019 0749 Gross per 24 hour  Intake 940 ml  Output 2000 ml  Net -1060 ml   Filed Weights   04/09/19 0500 04/09/19 2026 04/10/19 0444  Weight: 95.9 kg 94.7 kg 95.4 kg    Examination:  General exam: Appears calm and comfortable.  Patient is obese. Respiratory system: Clear to auscultation. Respiratory effort normal. Cardiovascular system: S1 & S2  Gastrointestinal system: Abdomen is obese, soft and nontender. No organomegaly or masses felt. Normal bowel sounds heard. Central nervous system: Alert and oriented.  Patient moves all extremities. Extremities: Bilateral leg edema.    Data Reviewed: I have personally reviewed following labs and imaging studies  CBC: Recent Labs  Lab 04/07/19 0610 04/08/19 0222 04/08/19 1712 04/09/19 1859 04/10/19 0515  WBC 11.7* 8.9 6.8 6.6 7.6  NEUTROABS 9.0*  --   --   --   --   HGB 12.1 10.5* 10.4* 10.1* 10.2*  HCT 38.3 33.3* 33.4* 31.6* 32.2*  MCV 93.2 93.3 94.9 92.7  93.1  PLT 200 195 172 181 163   Basic Metabolic Panel: Recent Labs  Lab 04/04/19 0231 04/05/19 0632 04/07/19 0356 04/07/19 0610 04/08/19 0222 04/08/19 1712 04/09/19 1859 04/10/19 0515  NA 137 137 139 139 141  --  136  --   K 4.7 4.5 4.9 4.9 3.6  --  4.3  --   CL 106 106  --  104 106  --  103  --   CO2 24 21*  --   26 25  --  23  --   GLUCOSE 239* 195*  --  230* 134*  --  286*  --   BUN 35* 32*  --  33* 30*  --  26*  --   CREATININE 2.13* 1.63*  --  1.96* 1.73* 1.69* 1.56*  --   CALCIUM 8.4* 8.5*  --  8.5* 8.1*  --  8.2*  --   MG  --   --   --  2.0  --   --   --  1.7  PHOS  --   --   --  4.3  --   --   --  3.0   GFR: Estimated Creatinine Clearance: 38.3 mL/min (A) (by C-G formula based on SCr of 1.56 mg/dL (H)). Liver Function Tests: Recent Labs  Lab 04/07/19 0610  AST 16  ALT 15  ALKPHOS 67  BILITOT 0.8  PROT 5.9*  ALBUMIN 2.8*   No results for input(s): LIPASE, AMYLASE in the last 168 hours. No results for input(s): AMMONIA in the last 168 hours. Coagulation Profile: Recent Labs  Lab 04/07/19 0610  INR 1.2   Cardiac Enzymes: No results for input(s): CKTOTAL, CKMB, CKMBINDEX, TROPONINI in the last 168 hours. BNP (last 3 results) No results for input(s): PROBNP in the last 8760 hours. HbA1C: No results for input(s): HGBA1C in the last 72 hours. CBG: Recent Labs  Lab 04/09/19 1553 04/09/19 2108 04/10/19 0044 04/10/19 0433 04/10/19 0747  GLUCAP 304* 200* 209* 149* 153*   Lipid Profile: Recent Labs    04/08/19 0222  TRIG 64   Thyroid Function Tests: No results for input(s): TSH, T4TOTAL, FREET4, T3FREE, THYROIDAB in the last 72 hours. Anemia Panel: No results for input(s): VITAMINB12, FOLATE, FERRITIN, TIBC, IRON, RETICCTPCT in the last 72 hours. Urine analysis:    Component Value Date/Time   COLORURINE YELLOW 04/07/2019 0446   APPEARANCEUR HAZY (A) 04/07/2019 0446   LABSPEC 1.020 04/07/2019 0446   PHURINE 5.0 04/07/2019 0446   GLUCOSEU >=500 (A) 04/07/2019 0446   HGBUR MODERATE (A) 04/07/2019 0446   BILIRUBINUR NEGATIVE 04/07/2019 0446   KETONESUR NEGATIVE 04/07/2019 0446   PROTEINUR NEGATIVE 04/07/2019 0446   NITRITE NEGATIVE 04/07/2019 0446   LEUKOCYTESUR NEGATIVE 04/07/2019 0446   Sepsis Labs: @LABRCNTIP (procalcitonin:4,lacticidven:4)  ) Recent Results  (from the past 240 hour(s))  SARS Coronavirus 2 [facility] order, Performed in [facility] hospital lab) Nasopharyngeal Nasopharyngeal Swab     Status: None   Collection Time: 03/31/19  1:23 PM   Specimen: Nasopharyngeal Swab  Result Value Ref Range Status   SARS Coronavirus 2 NEGATIVE NEGATIVE Final    Comment: (NOTE) If result is NEGATIVE SARS-CoV-2 target nucleic acids are NOT DETECTED. The SARS-CoV-2 RNA is generally detectable in upper and lower  respiratory specimens during the acute phase of infection. The lowest  concentration of SARS-CoV-2 viral copies this assay can detect is 250  copies / mL. A negative result does not preclude SARS-CoV-2 infection  and should not be  used as the sole basis for treatment or other  patient management decisions.  A negative result may occur with  improper specimen collection / handling, submission of specimen other  than nasopharyngeal swab, presence of viral mutation(s) within the  areas targeted by this assay, and inadequate number of viral copies  (<250 copies / mL). A negative result must be combined with clinical  observations, patient history, and epidemiological information. If result is POSITIVE SARS-CoV-2 target nucleic acids are DETECTED. The SARS-CoV-2 RNA is generally detectable in upper and lower  respiratory specimens dur ing the acute phase of infection.  Positive  results are indicative of active infection with SARS-CoV-2.  Clinical  correlation with patient history and other diagnostic information is  necessary to determine patient infection status.  Positive results do  not rule out bacterial infection or co-infection with other viruses. If result is PRESUMPTIVE POSTIVE SARS-CoV-2 nucleic acids MAY BE PRESENT.   A presumptive positive result was obtained on the submitted specimen  and confirmed on repeat testing.  While 2019 novel coronavirus  (SARS-CoV-2) nucleic acids may be present in the submitted sample  additional  confirmatory testing may be necessary for epidemiological  and / or clinical management purposes  to differentiate between  SARS-CoV-2 and other Sarbecovirus currently known to infect humans.  If clinically indicated additional testing with an alternate test  methodology [phone]) is advised. The SARS-CoV-2 RNA is generally  detectable in upper and lower respiratory sp ecimens during the acute  phase of infection. The expected result is Negative. Fact Sheet for Patients:  StrictlyIdeas.no Fact Sheet for Healthcare Providers: BankingDealers.co.za This test is not yet approved or cleared by the [place] FDA and has been authorized for detection and/or diagnosis of SARS-CoV-2 by FDA under an Emergency Use Authorization (EUA).  This EUA will remain in effect (meaning this test can be used) for the duration of the COVID-19 declaration under Section 564(b)(1) of the Act, 21 U.S.C. section 360bbb-3(b)(1), unless the authorization is terminated or revoked sooner. Performed at [facility], [place] [address], [place], [place] [address]   SARS Coronavirus 2 [facility] order, Performed in [facility] hospital lab)     Status: None   Collection Time: 04/07/19  3:36 AM  Result Value Ref Range Status   SARS Coronavirus 2 NEGATIVE NEGATIVE Final    Comment: (NOTE) If result is NEGATIVE SARS-CoV-2 target nucleic acids are NOT DETECTED. The SARS-CoV-2 RNA is generally detectable in upper and lower  respiratory specimens during the acute phase of infection. The lowest  concentration of SARS-CoV-2 viral copies this assay can detect is 250  copies / mL. A negative result does not preclude SARS-CoV-2 infection  and should not be used as the sole basis for treatment or other  patient management decisions.  A negative result may occur with  improper specimen collection / handling, submission of specimen other  than nasopharyngeal swab, presence of  viral mutation(s) within the  areas targeted by this assay, and inadequate number of viral copies  (<250 copies / mL). A negative result must be combined with clinical  observations, patient history, and epidemiological information. If result is POSITIVE SARS-CoV-2 target nucleic acids are DETECTED. The SARS-CoV-2 RNA is generally detectable in upper and lower  respiratory specimens dur ing the acute phase of infection.  Positive  results are indicative of active infection with SARS-CoV-2.  Clinical  correlation with patient history and other diagnostic information is  necessary to determine patient infection status.  Positive  results do  not rule out bacterial infection or co-infection with other viruses. If result is PRESUMPTIVE POSTIVE SARS-CoV-2 nucleic acids MAY BE PRESENT.   A presumptive positive result was obtained on the submitted specimen  and confirmed on repeat testing.  While 2019 novel coronavirus  (SARS-CoV-2) nucleic acids may be present in the submitted sample  additional confirmatory testing may be necessary for epidemiological  and / or clinical management purposes  to differentiate between  SARS-CoV-2 and other Sarbecovirus currently known to infect humans.  If clinically indicated additional testing with an alternate test  methodology [phone]) is advised. The SARS-CoV-2 RNA is generally  detectable in upper and lower respiratory sp ecimens during the acute  phase of infection. The expected result is Negative. Fact Sheet for Patients:  StrictlyIdeas.no Fact Sheet for Healthcare Providers: BankingDealers.co.za This test is not yet approved or cleared by the [place] FDA and has been authorized for detection and/or diagnosis of SARS-CoV-2 by FDA under an Emergency Use Authorization (EUA).  This EUA will remain in effect (meaning this test can be used) for the duration of the COVID-19 declaration under Section  564(b)(1) of the Act, 21 U.S.C. section 360bbb-3(b)(1), unless the authorization is terminated or revoked sooner. Performed at [facility], [place] [address], [place], [place] [address]          Radiology Studies: No results found.      Scheduled Meds: . aspirin EC  81 mg Oral Daily  . atorvastatin  40 mg Oral q1800  . chlorhexidine  15 mL Mouth Rinse BID  . Chlorhexidine Gluconate Cloth  6 each Topical Daily  . heparin  5,000 Units Subcutaneous Q8H  . insulin aspart  1-3 Units Subcutaneous Q4H  . mouth rinse  15 mL Mouth Rinse q12n4p  . sodium chloride flush  3 mL Intravenous Q12H  . ticagrelor  90 mg Oral BID   Continuous Infusions: . sodium chloride    . nitroGLYCERIN 10 mcg/min (04/10/19 0958)     LOS: 3 days    Time spent: 35 minutes   Dana Allan, MD  [facility] Pager #: [phone] 7PM-7AM contact night coverage as above

## 2019-04-10 NOTE — Progress Notes (Addendum)
Called MD- they gave verbal order to discontinue Nitro drip.

## 2019-04-10 NOTE — Progress Notes (Signed)
Progress Note   Subjective   The patient has developed recurrent angina this am.  Now resolved after nitro and dilaudid.  Currently sleepy but resting comfortably.  Inpatient Medications    Scheduled Meds: . aspirin EC  81 mg Oral Daily  . atorvastatin  40 mg Oral q1800  . chlorhexidine  15 mL Mouth Rinse BID  . Chlorhexidine Gluconate Cloth  6 each Topical Daily  . heparin  5,000 Units Subcutaneous Q8H  . insulin aspart  1-3 Units Subcutaneous Q4H  . mouth rinse  15 mL Mouth Rinse q12n4p  . sodium chloride flush  3 mL Intravenous Q12H  . ticagrelor  90 mg Oral BID   Continuous Infusions: . sodium chloride    . nitroGLYCERIN 10 mcg/min (04/10/19 0958)   PRN Meds: sodium chloride, docusate sodium, nitroGLYCERIN, ondansetron (ZOFRAN) IV, sodium chloride flush   Vital Signs    Vitals:   04/10/19 1002 04/10/19 1007 04/10/19 1014 04/10/19 1020  BP: (!) 154/67 (!) 148/59 (!) 128/53 (!) 120/59  Pulse: (!) 110 (!) 110 (!) 101 97  Resp:      Temp:      TempSrc:      SpO2: 98% 97% 97% 96%  Weight:      Height:        Intake/Output Summary (Last 24 hours) at 04/10/2019 1028 Last data filed at 04/10/2019 0749 Gross per 24 hour  Intake 940 ml  Output 2000 ml  Net -1060 ml   Filed Weights   04/09/19 0500 04/09/19 2026 04/10/19 0444  Weight: 95.9 kg 94.7 kg 95.4 kg    Telemetry    sinus - Personally Reviewed  Physical Exam   GEN- The patient is elderly and frail appearing, sleepy but conversant Head- normocephalic, atraumatic Eyes-  Sclera clear, conjunctiva pink Ears- hearing intact Oropharynx- clear Neck- supple, Lungs-  normal work of breathing Heart- Regular rate and rhythm  GI- soft, NT, ND, + BS Extremities- no clubbing, cyanosis, + dependant edema  MS- diffuse atrophy Skin- echymosis noted Psych- euthymic mood, full affect Neuro- strength and sensation are intact   Labs    Chemistry Recent Labs  Lab 04/07/19 0610 04/08/19 0222 04/08/19 1712  04/09/19 1859  NA 139 141  --  136  K 4.9 3.6  --  4.3  CL 104 106  --  103  CO2 26 25  --  23  GLUCOSE 230* 134*  --  286*  BUN 33* 30*  --  26*  CREATININE 1.96* 1.73* 1.69* 1.56*  CALCIUM 8.5* 8.1*  --  8.2*  PROT 5.9*  --   --   --   ALBUMIN 2.8*  --   --   --   AST 16  --   --   --   ALT 15  --   --   --   ALKPHOS 67  --   --   --   BILITOT 0.8  --   --   --   GFRNONAA 24* 28* 29* 32*  GFRAA 28* 33* 34* 37*  ANIONGAP 9 10  --  10     Hematology Recent Labs  Lab 04/08/19 1712 04/09/19 1859 04/10/19 0515  WBC 6.8 6.6 7.6  RBC 3.52* 3.41* 3.46*  HGB 10.4* 10.1* 10.2*  HCT 33.4* 31.6* 32.2*  MCV 94.9 92.7 93.1  MCH 29.5 29.6 29.5  MCHC 31.1 32.0 31.7  RDW 13.1 13.0 12.8  PLT 172 181 178   Patient Profile  73 y.o.femalewith flash pulmonary edema in the setting of unstable angina with recent non-ST elevation myocardial infarction and underlying severe coronary artery disease.  S/p successful atherectomy and stenting of the proximal LAD and left mainstem with impella support by Dr Burt Knack 04/08/2019.   Assessment & Plan    1.  CAD with unstable angina She has extensive CAD.  S/p successful atherectomy and stenting of the proximal LAD and left main with impella support yesterday. Continue ASA and ticagrelor Currently on nitro drip,  Receiving IV lopressor Will need substantial intensification of her medical regimen to treat angina Start metoprolol 25mg  PO q6 hours which can be uptitrated to goal HR < 60 Holding off on ACEi due to acute on chronic renal failure.  May be able to add ACEi eventually Also consider addition of norvasc once beta blocker is optimized.  2. Acute on chronic renal failure Continues to improve Holding ACEi as above  3. Diabetes Stable No change required today  4. Acute respiratory failure Improved Overall status is tenuous  5. Acute on chronic systolic dysfunction/ ischemic CM Diuresis limited by renal failure Optimization  medical therapy for CAD as above Keep Is and Os negative as able  6. Hypertensive cardiorenal disease with CHF Add metoprolol Add norvasc once metoprolol is optimized  Guarded prognosis.  A high level of decision making was required for this encounter.  Thompson Grayer MD, [facility] 04/10/2019 10:28 AM

## 2019-04-10 NOTE — Progress Notes (Signed)
   Nurse called me to the room. Pt having chest pain, dull pressure across chest. Mild shortness of breath, pt says feels "like her nose is getting blocked up". EKG with slight worsening of TWI and possible slight Depression. Some improvement with SL NTG X1, but the chest pressure is now returning. BP is stable. Will repeat NTG.  Heart rate regular mildly tachycardic. Lungs clear.  May need small dose of morphine.  Dr. Rayann Heman notified.   Daune Perch, AGNP-C [facility] 04/10/2019  8:57 AM

## 2019-04-10 NOTE — Progress Notes (Signed)
Called back into patient room-  Chest pain worsening.  Pt report 8 out of 10 chest discomfort.  BP back up to 142/53 HR still tachy (110-115s)  NP came in to check on patient.  Ok to give diluadid IV and will start Nitro drip.

## 2019-04-10 NOTE — Progress Notes (Signed)
Called cardiologist- ok to taper nitro drip down.

## 2019-04-10 NOTE — Progress Notes (Signed)
PT Cancellation Note  Patient Details Name: Lori Rivera MRN: [id number] DOB: 01-25-1946   Cancelled Treatment:    Reason Eval/Treat Not Completed: Medical issues which prohibited therapy. Holding PT eval today due to pt having chest pain this AM. Pt currently on nitro drip. PT to re-attempt as time allows and pt medically ready.   [name] 04/10/2019, 10:50 AM   Lorrin Goodell, PT  Office # [phone] Pager [phone]

## 2019-04-10 NOTE — Progress Notes (Signed)
Nitro x1 at [employer], patient now does not have any pain. BP 92/47 HR 96

## 2019-04-11 ENCOUNTER — Encounter (HOSPITAL_COMMUNITY): Payer: Self-pay | Admitting: Cardiovascular Disease

## 2019-04-11 DIAGNOSIS — I25110 Atherosclerotic heart disease of native coronary artery with unstable angina pectoris: Secondary | ICD-10-CM

## 2019-04-11 LAB — CBC
HCT: 31.5 % — ABNORMAL LOW (ref 36.0–46.0)
Hemoglobin: 10 g/dL — ABNORMAL LOW (ref 12.0–15.0)
MCH: 29.7 pg (ref 26.0–34.0)
MCHC: 31.7 g/dL (ref 30.0–36.0)
MCV: 93.5 fL (ref 80.0–100.0)
Platelets: 213 10*3/uL (ref 150–400)
RBC: 3.37 MIL/uL — ABNORMAL LOW (ref 3.87–5.11)
RDW: 13.1 % (ref 11.5–15.5)
WBC: 7.4 10*3/uL (ref 4.0–10.5)
nRBC: 0 % (ref 0.0–0.2)

## 2019-04-11 LAB — GLUCOSE, CAPILLARY
Glucose-Capillary: 166 mg/dL — ABNORMAL HIGH (ref 70–99)
Glucose-Capillary: 240 mg/dL — ABNORMAL HIGH (ref 70–99)
Glucose-Capillary: 246 mg/dL — ABNORMAL HIGH (ref 70–99)
Glucose-Capillary: 152 mg/dL — ABNORMAL HIGH (ref 70–99)

## 2019-04-11 LAB — PHOSPHORUS: Phosphorus: 3.3 mg/dL (ref 2.5–4.6)

## 2019-04-11 LAB — MAGNESIUM: Magnesium: 1.7 mg/dL (ref 1.7–2.4)

## 2019-04-11 MED ORDER — ADULT MULTIVITAMIN W/MINERALS CH
1.0000 | ORAL_TABLET | Freq: Every day | ORAL | Status: DC
  Administered 2019-04-11 – 2019-04-14 (×4): 1 via ORAL
  Filled 2019-04-11 (×4): qty 1

## 2019-04-11 MED ORDER — METOPROLOL TARTRATE 50 MG PO TABS
75.0000 mg | ORAL_TABLET | Freq: Two times a day (BID) | ORAL | Status: DC
  Administered 2019-04-11 – 2019-04-14 (×6): 75 mg via ORAL
  Filled 2019-04-11 (×7): qty 1

## 2019-04-11 MED ORDER — JUVEN PO PACK
1.0000 | PACK | Freq: Two times a day (BID) | ORAL | Status: DC
  Administered 2019-04-11 – 2019-04-14 (×7): 1 via ORAL
  Filled 2019-04-11 (×7): qty 1

## 2019-04-11 MED ORDER — METOPROLOL TARTRATE 50 MG PO TABS: 50.0000 mg | ORAL_TABLET | Freq: Two times a day (BID) | ORAL | Status: DC

## 2019-04-11 MED ORDER — ISOSORBIDE MONONITRATE ER 60 MG PO TB24
60.0000 mg | ORAL_TABLET | Freq: Every day | ORAL | Status: DC
  Administered 2019-04-11 – 2019-04-14 (×4): 60 mg via ORAL
  Filled 2019-04-11 (×4): qty 1

## 2019-04-11 MED FILL — Heparin Sod (Porcine)-NaCl IV Soln 1000 Unit/500ML-0.9%: INTRAVENOUS | Qty: 500 | Status: AC

## 2019-04-11 NOTE — Progress Notes (Signed)
CARDIAC REHAB PHASE I   PRE:  Rate/Rhythm: 82 SR    BP: sitting 105/41    SaO2: 99 RA  MODE:  Ambulation: 110 ft   POST:  Rate/Rhythm: 108 ST    BP: sitting 128/53     SaO2: 100 RA  Able to move to EOB and stood with min assist and bed raised. Had been incontinent of urine in bed. Weak with RW, assist x2 and gait belt. Stopped by sink and washed hands then ambulated hall. Slow pace, DOE with distance. Had her rest x2 due to fatigue. To recliner after walk, VSS. Denied CP. She is quite weak. Will continue to follow as x2. [place], ACSM 04/11/2019 2:05 PM

## 2019-04-11 NOTE — Progress Notes (Signed)
PROGRESS NOTE    Lori Rivera  CWC:[id number] DOB: 1945-08-18 DOA: 04/07/2019 PCP: Garwin Brothers, MD  Outpatient Specialists:   Brief Narrative:  Patient is a 73 year old female with extensive cardiac history.  Patient carries diagnosis of diabetes mellitus type 2, STEMI, and STEMI, ischemic cardiomyopathy, hypertension, CKD stage III, combined systolic and diastolic congestive heart failure and aortic regurgitation.  Patient presented to [facility] with chest pain and shortness of breath.  Patient was noted to be hypoxemic respiratory failure was transferred to [facility].  Patient was admitted under the ICU team and intubated.  Cardiology team directed cardiac care.  Patient underwent coronary angiography that revealed multivessel disease.  After due consideration, decision was taken to proceed with atherectomy and PCI rather than CABG.  Patient was transferred out of the ICU, and the hospitalist team has assumed primary care.  Cardiology team is directing cardiac management.   Assessment & Plan:   Active Problems:   Acute on chronic systolic heart failure (HCC)   Acute respiratory failure with hypoxia (HCC)   Cardiogenic pulmonary edema (HCC)  Acute hypoxic respiratory failure secondary to decompensated congestive heart failure: Resolved. Patient is status post intubation and extubation.  Congestive heart failure symptoms have resolved significantly.    Unstable angina with severe three-vessel coronary artery disease: - Patient underwent therectomy and PCI for severe triple vessel coronary artery disease, including left main.. - Chest pain reported earlier today but resolved.   -Patient is off nitro drip.   Cardiology is directing care.  Diabetes mellitus: Blood sugar is controlled. Continue sliding scale insulin coverage.  DVT prophylaxis: Subacute heparin Code Status: Partial Family Communication: Daughter Disposition Plan: This will depend on hospital course    Consultants:   Cardiology  Procedures:   Atherectomy and PCI  Antimicrobials:   None   Subjective: Patient had chest pain earlier today but now resolved.  Objective: Vitals:   04/11/19 0840 04/11/19 0849 04/11/19 0851 04/11/19 0916  BP: (!) 120/50 (!) 116/57 (!) 100/45 (!) 116/37  Pulse: 90 89 90 83  Resp:      Temp:      TempSrc:      SpO2: 99% 96% 95%   Weight:      Height:        Intake/Output Summary (Last 24 hours) at 04/11/2019 1605 Last data filed at 04/11/2019 1556 Gross per 24 hour  Intake 866.07 ml  Output 850 ml  Net 16.07 ml   Filed Weights   04/09/19 2026 04/10/19 0444 04/11/19 0519  Weight: 94.7 kg 95.4 kg 93.4 kg    Examination:  General exam: Appears calm and comfortable.  Patient is obese. Respiratory system: Clear to auscultation. Respiratory effort normal. Cardiovascular system: S1 & S2  Gastrointestinal system: Abdomen is obese, soft and nontender. No organomegaly or masses felt. Normal bowel sounds heard. Central nervous system: Alert and oriented.  Patient moves all extremities. Extremities: Bilateral leg edema.    Data Reviewed: I have personally reviewed following labs and imaging studies  CBC: Recent Labs  Lab 04/07/19 0610 04/08/19 0222 04/08/19 1712 04/09/19 1859 04/10/19 0515 04/11/19 0607  WBC 11.7* 8.9 6.8 6.6 7.6 7.4  NEUTROABS 9.0*  --   --   --   --   --   HGB 12.1 10.5* 10.4* 10.1* 10.2* 10.0*  HCT 38.3 33.3* 33.4* 31.6* 32.2* 31.5*  MCV 93.2 93.3 94.9 92.7 93.1 93.5  PLT 200 195 172 181 178 761   Basic Metabolic Panel: Recent Labs  Lab 04/05/19 1610 04/07/19 0356 04/07/19 0610 04/08/19 0222 04/08/19 1712 04/09/19 1859 04/10/19 0515 04/11/19 0607  NA 137 139 139 141  --  136  --   --   K 4.5 4.9 4.9 3.6  --  4.3  --   --   CL 106  --  104 106  --  103  --   --   CO2 21*  --  26 25  --  23  --   --   GLUCOSE 195*  --  230* 134*  --  286*  --   --   BUN 32*  --  33* 30*  --  26*  --   --    CREATININE 1.63*  --  1.96* 1.73* 1.69* 1.56*  --   --   CALCIUM 8.5*  --  8.5* 8.1*  --  8.2*  --   --   MG  --   --  2.0  --   --   --  1.7 1.7  PHOS  --   --  4.3  --   --   --  3.0 3.3   GFR: Estimated Creatinine Clearance: 37.9 mL/min (A) (by C-G formula based on SCr of 1.56 mg/dL (H)). Liver Function Tests: Recent Labs  Lab 04/07/19 0610  AST 16  ALT 15  ALKPHOS 67  BILITOT 0.8  PROT 5.9*  ALBUMIN 2.8*   No results for input(s): LIPASE, AMYLASE in the last 168 hours. No results for input(s): AMMONIA in the last 168 hours. Coagulation Profile: Recent Labs  Lab 04/07/19 0610  INR 1.2   Cardiac Enzymes: No results for input(s): CKTOTAL, CKMB, CKMBINDEX, TROPONINI in the last 168 hours. BNP (last 3 results) No results for input(s): PROBNP in the last 8760 hours. HbA1C: No results for input(s): HGBA1C in the last 72 hours. CBG: Recent Labs  Lab 04/10/19 1149 04/10/19 1629 04/10/19 2119 04/11/19 0632 04/11/19 1128  GLUCAP 252* 184* 217* 166* 240*   Lipid Profile: No results for input(s): CHOL, HDL, LDLCALC, TRIG, CHOLHDL, LDLDIRECT in the last 72 hours. Thyroid Function Tests: No results for input(s): TSH, T4TOTAL, FREET4, T3FREE, THYROIDAB in the last 72 hours. Anemia Panel: No results for input(s): VITAMINB12, FOLATE, FERRITIN, TIBC, IRON, RETICCTPCT in the last 72 hours. Urine analysis:    Component Value Date/Time   COLORURINE YELLOW 04/07/2019 0446   APPEARANCEUR HAZY (A) 04/07/2019 0446   LABSPEC 1.020 04/07/2019 0446   PHURINE 5.0 04/07/2019 0446   GLUCOSEU >=500 (A) 04/07/2019 0446   HGBUR MODERATE (A) 04/07/2019 0446   BILIRUBINUR NEGATIVE 04/07/2019 0446   KETONESUR NEGATIVE 04/07/2019 0446   PROTEINUR NEGATIVE 04/07/2019 0446   NITRITE NEGATIVE 04/07/2019 0446   LEUKOCYTESUR NEGATIVE 04/07/2019 0446   Sepsis Labs: @LABRCNTIP (procalcitonin:4,lacticidven:4)  ) Recent Results (from the past 240 hour(s))  SARS Coronavirus 2 [facility] order,  Performed in [place] hospital lab)     Status: None   Collection Time: 04/07/19  3:36 AM  Result Value Ref Range Status   SARS Coronavirus 2 NEGATIVE NEGATIVE Final    Comment: (NOTE) If result is NEGATIVE SARS-CoV-2 target nucleic acids are NOT DETECTED. The SARS-CoV-2 RNA is generally detectable in upper and lower  respiratory specimens during the acute phase of infection. The lowest  concentration of SARS-CoV-2 viral copies this assay can detect is 250  copies / mL. A negative result does not preclude SARS-CoV-2 infection  and should not be used as the sole basis for treatment or other  patient management decisions.  A negative result may occur with  improper specimen collection / handling, submission of specimen other  than nasopharyngeal swab, presence of viral mutation(s) within the  areas targeted by this assay, and inadequate number of viral copies  (<250 copies / mL). A negative result must be combined with clinical  observations, patient history, and epidemiological information. If result is POSITIVE SARS-CoV-2 target nucleic acids are DETECTED. The SARS-CoV-2 RNA is generally detectable in upper and lower  respiratory specimens dur ing the acute phase of infection.  Positive  results are indicative of active infection with SARS-CoV-2.  Clinical  correlation with patient history and other diagnostic information is  necessary to determine patient infection status.  Positive results do  not rule out bacterial infection or co-infection with other viruses. If result is PRESUMPTIVE POSTIVE SARS-CoV-2 nucleic acids MAY BE PRESENT.   A presumptive positive result was obtained on the submitted specimen  and confirmed on repeat testing.  While 2019 novel coronavirus  (SARS-CoV-2) nucleic acids may be present in the submitted sample  additional confirmatory testing may be necessary for epidemiological  and / or clinical management purposes  to differentiate between  SARS-CoV-2  and other Sarbecovirus currently known to infect humans.  If clinically indicated additional testing with an alternate test  methodology [phone]) is advised. The SARS-CoV-2 RNA is generally  detectable in upper and lower respiratory sp ecimens during the acute  phase of infection. The expected result is Negative. Fact Sheet for Patients:  StrictlyIdeas.no Fact Sheet for Healthcare Providers: BankingDealers.co.za This test is not yet approved or cleared by the [place] FDA and has been authorized for detection and/or diagnosis of SARS-CoV-2 by FDA under an Emergency Use Authorization (EUA).  This EUA will remain in effect (meaning this test can be used) for the duration of the COVID-19 declaration under Section 564(b)(1) of the Act, 21 U.S.C. section 360bbb-3(b)(1), unless the authorization is terminated or revoked sooner. Performed at [facility], [address], [place], [place] [address]          Radiology Studies: No results found.      Scheduled Meds: . aspirin EC  81 mg Oral Daily  . atorvastatin  40 mg Oral q1800  . chlorhexidine  15 mL Mouth Rinse BID  . heparin  5,000 Units Subcutaneous Q8H  . insulin aspart  0-5 Units Subcutaneous QHS  . insulin aspart  0-9 Units Subcutaneous TID WC  . isosorbide mononitrate  60 mg Oral Daily  . mouth rinse  15 mL Mouth Rinse q12n4p  . metoprolol tartrate  75 mg Oral BID  . multivitamin with minerals  1 tablet Oral Daily  . nutrition supplement (JUVEN)  1 packet Oral BID BM  . sodium chloride flush  3 mL Intravenous Q12H  . ticagrelor  90 mg Oral BID   Continuous Infusions: . sodium chloride       LOS: 4 days    Time spent: 25 minutes   Dana Allan, MD  [facility] Pager #: [phone] 7PM-7AM contact night coverage as above

## 2019-04-11 NOTE — Care Management Important Message (Signed)
Important Message  Patient Details  Name: Lori Rivera MRN: [id number] Date of Birth: 03-14-1946   Medicare Important Message Given:  Yes     Shelda Altes 04/11/2019, 1:19 PM

## 2019-04-11 NOTE — Progress Notes (Signed)
Dr Martinique came to this nurse to state pt is having cp and wants NTG given, requested ekg by NT, called to Shirlee Limerick primary nurse to advise of Dr Martinique order, pt states pain is a 4 like it was yesterday, vss, Regulatory affairs officer at bedside taking over care

## 2019-04-11 NOTE — Progress Notes (Signed)
Progress Note  Patient Name: Lori Rivera Date of Encounter: 04/11/2019  Primary Cardiologist: Shirlee More, MD   Subjective   Patient reports recurrent chest pain this am. Started about 5 minutes ago. Was weaned off IV Ntg yesterday afternoon and this is the first pain she has had since then.   Inpatient Medications    Scheduled Meds:  aspirin EC  81 mg Oral Daily   atorvastatin  40 mg Oral q1800   chlorhexidine  15 mL Mouth Rinse BID   heparin  5,000 Units Subcutaneous Q8H   insulin aspart  0-5 Units Subcutaneous QHS   insulin aspart  0-9 Units Subcutaneous TID WC   isosorbide mononitrate  60 mg Oral Daily   mouth rinse  15 mL Mouth Rinse q12n4p   metoprolol tartrate  50 mg Oral BID   sodium chloride flush  3 mL Intravenous Q12H   ticagrelor  90 mg Oral BID   Continuous Infusions:  sodium chloride     PRN Meds: sodium chloride, docusate sodium, nitroGLYCERIN, ondansetron (ZOFRAN) IV, sodium chloride flush   Vital Signs    Vitals:   04/10/19 2011 04/10/19 2213 04/11/19 0519 04/11/19 0812  BP: (!) 111/50 (!) 118/47 (!) 126/51 (!) 116/48  Pulse: 82 73 81 87  Resp: 18  18 18   Temp: 98.4 F (36.9 C)  98.2 F (36.8 C) (!) 97.4 F (36.3 C)  TempSrc: Oral  Oral Oral  SpO2: 99% 99% 99% 96%  Weight:   93.4 kg   Height:        Intake/Output Summary (Last 24 hours) at 04/11/2019 0838 Last data filed at 04/11/2019 0700 Gross per 24 hour  Intake 850.07 ml  Output 550 ml  Net 300.07 ml   Last 3 Weights 04/11/2019 04/10/2019 04/09/2019  Weight (lbs) 205 lb 14.4 oz 210 lb 6.4 oz 208 lb 12.8 oz  Weight (kg) 93.396 kg 95.437 kg 94.711 kg      Telemetry    NSR with PACs and PVCs.  - Personally Reviewed  ECG    Dated 04/10/19: sinus tachy with old septal infarct. Diffuse ST depression - Personally Reviewed  Physical Exam   GEN: Elderly WF frail. Looks uncomfortable.   Neck: No JVD Cardiac: RRR, soft 1/6 systolic murmur at the apex. No  gallop. Respiratory: Clear to auscultation bilaterally. GI: Soft, nontender, non-distended  MS: No edema; No deformity. Right femoral access site without hematoma. Extensive bruising on her arms.  Neuro:  Nonfocal  Psych: Normal affect   Labs    High Sensitivity Troponin:   Recent Labs  Lab 03/31/19 0750 04/07/19 0610 04/07/19 0908  TROPONINIHS 330* 183* 167*      Chemistry Recent Labs  Lab 04/07/19 0610 04/08/19 0222 04/08/19 1712 04/09/19 1859  NA 139 141  --  136  K 4.9 3.6  --  4.3  CL 104 106  --  103  CO2 26 25  --  23  GLUCOSE 230* 134*  --  286*  BUN 33* 30*  --  26*  CREATININE 1.96* 1.73* 1.69* 1.56*  CALCIUM 8.5* 8.1*  --  8.2*  PROT 5.9*  --   --   --   ALBUMIN 2.8*  --   --   --   AST 16  --   --   --   ALT 15  --   --   --   ALKPHOS 67  --   --   --   BILITOT 0.8  --   --   --  GFRNONAA 24* 28* 29* 32*  GFRAA 28* 33* 34* 37*  ANIONGAP 9 10  --  10     Hematology Recent Labs  Lab 04/09/19 1859 04/10/19 0515 04/11/19 0607  WBC 6.6 7.6 7.4  RBC 3.41* 3.46* 3.37*  HGB 10.1* 10.2* 10.0*  HCT 31.6* 32.2* 31.5*  MCV 92.7 93.1 93.5  MCH 29.6 29.5 29.7  MCHC 32.0 31.7 31.7  RDW 13.0 12.8 13.1  PLT 181 178 213    BNPNo results for input(s): BNP, PROBNP in the last 168 hours.   DDimer No results for input(s): DDIMER in the last 168 hours.   Radiology    No results found.  Cardiac Studies   LEFT HEART CATH AND CORONARY ANGIOGRAPHY  Conclusion   Severe three-vessel coronary disease including borderline significant left main disease.  The right coronary is heavily stented from proximal to distal and is totally occluded proximally.  The right coronary fills by left-to-right collaterals.  The left main dampens with engagement using a 5 French catheter.  There is 40 to 50% ostial to mid left main narrowing.  The LAD is heavily calcified and stented from proximal to distal.  At least 3 stents are noted.  There is diffuse in-stent  restenosis in the proximal LAD with up to 50% narrowing.  Just distal to the stent there is eccentric 85 to 90% stenosis.  An adjacent diagonal contains proximal diffuse 80 to 90% stenosis forming a [place] 011 bifurcation stenosis.  There is distal LAD disease beyond the third stent.  The circumflex is relatively small.  The first obtuse marginal/ramus contains segmental 95% stenosis from ostial to proximal and apparently has previously undergone attempted PCI and was labeled "non-dilatable".  The circumflex then becomes very small after the first marginal giving to small to moderate sized obtuse marginal branches with a 70% stenosis proximal to the third obtuse marginal.  Left ventricular systolic dysfunction with EF 35 to 40% with regional wall motion abnormality involving the anterior wall and inferior wall.  LVEDP is 21 mmHg consistent with chronic combined systolic and diastolic heart failure.  RECOMMENDATIONS:   Heart team approach: Need to determine surgical versus interventional versus medical therapy.  After completing the procedure and speaking to family members, apparently the patient has had altered cognitive function/interaction since her myocardial infarction.  She suffered cardiac arrest and had 12 minutes of CPR according to the daughter.  After discussion, perhaps PCI will be her revascularization approach but will be high risk.  At this point it appears that treating the LAD diagonal or simply the LAD is technically possible although with heavy calcification and prior history of non-dilatable lesion, perhaps an atheroablative technique should be considered.   Echo: IMPRESSIONS    1. The left ventricle has moderately reduced systolic function, with an ejection fraction of 35-40%. The cavity size was normal. Left ventricular diastolic Doppler parameters are consistent with impaired relaxation.  2. The right ventricle has normal systolic function. The cavity was normal. There is  no increase in right ventricular wall thickness.  3. The mitral valve is abnormal. Moderate thickening of the mitral valve leaflet. Moderate calcification of the mitral valve leaflet.  4. The tricuspid valve is grossly normal.  5. The aortic valve is abnormal. Moderate thickening of the aortic valve. Moderate calcification of the aortic valve. Aortic valve regurgitation is mild by color flow Doppler. Mild stenosis of the aortic valve.  6. The aorta is normal unless otherwise noted.  7. The aortic root and  ascending aorta are normal in size and structure.  8. The atrial septum is grossly normal.  Coronary Stent Intervention w/Impella  CORONARY ATHERECTOMY  Conclusion  Successful atherectomy and stenting of the proximal LAD and left mainstem with Impella CP hemodynamic support  Recommendations  Antiplatelet/Anticoag Recommend dual antiplatelet therapy with Aspirin 82m daily and Ticagrelor 961mtwice daily long-term (beyond 12 months) because of left main stenting/diabetes.  Surgeon Notes    03/31/2019 1:04 PM CV Procedure signed by SmBelva CromeMD  Indications  Non-ST elevation (NSTEMI) myocardial infarction ([facility][I21.4 (ICD-10-CM)]  Procedural Details  Technical Details INDICATION: [age]ear old woman with severe multivessel coronary artery disease and recurrent acute pulmonary edema and elevated cardiac enzymes consistent with non-ST elevation infarction complicated by acute heart failure.  She underwent diagnostic catheterization demonstrating moderate left main stenosis, severe proximal LAD stenosis, and chronic total occlusion of the RCA with left-to-right collaterals.  Initial medical therapy was recommended because of the patient's relatively poor functional capacity.  She was not deemed candidate for consideration of cardiac surgery.  Unfortunately she had a recurrent respiratory failure event with acute pulmonary edema.  After review of treatment options, we elected to proceed with  hemodynamically supported PCI of the left mainstem and LAD.  PROCEDURAL DETAILS:  The right groin is prepped, draped, and anesthetized with 1% lidocaine. Using USKoreauidance, a micropuncture sheath is inserted into the right femoral artery.  A femoral angiogram is performed demonstrating appropriate position over the femoral head.  Double preclosed technique is used to preclosed the artery.  Using a 0.035 inch wire, the right femoral artery is progressively dilated and a 14 French Impella sheath is inserted under fluoroscopic guidance.  Angiomax was then started for anticoagulation.  The Impella catheter is inserted using normal technique into the left ventricle over the Impella wire which was changed out after recording LV pressure with a pigtail catheter.  Impella catheter position was deemed appropriate by fluoroscopic landmarks, hemodynamic data, and motor current waveforms.  At that point, the membrane of the hemostatic valve is punctured with a micropuncture needle and wire.  The membrane is then predilated with 7 FrPakistanilator.  I tried to advance a destination sheath but it would not advance far enough into the body even over a Lunderquist wire.  I was able to use a 6 FrPakistanhort sheath without difficulty.  A 6 FrPakistanBU guide was then advanced into the left coronary ostium with fairly marked pressure dampening.  The vessel was quickly wired with a cougar wire and guide catheter was then pulled back into the aorta.  I was then able to wire the vessel with a Viper flex wire without difficulty and this was advanced into the apical portion of the LAD.  CSI diamondback atherectomy was performed at low and high speed in both the proximal and mid LAD as well as the left main stem.  Multiple runs were made and the patient tolerated this very well from a hemodynamic perspective.  The CSI crown was then removed and the cougar wire was advanced back into the distal LAD.  The LAD and left main are both predilated  with a 3.0 mm balloon.  The LAD had a significant waist through the calcified lesion.  The LAD was then stented with a 3.0 x 28 mm Synergy drug-eluting stent.  The stent is poorly expanded throughout the proximal and midportion.  The stent is postdilated with a 3.25 mm noncompliant balloon to high pressure of 24 atm and  finally yielded at that pressure.  The left main is then stented with a 4.0 x 16 mm Synergy DES deployed at 16 atm.  The patient had a marked blood pressure drop during left main stent inflation.  The stent was deployed quickly and she recovered quickly.  The left main was then postdilated with a 4.5 mm noncompliant balloon to 16 atm.  The patient tolerated the entire procedure well.  Final angiography was performed.  The guide catheter and wire are removed.  The sheath is removed from the hemostatic membrane of the Impella sheath.  The Impella sheath was then removed and the Perclose sutures are tightened.     Patient Profile     73 y.o. female with flash pulmonary edema in the setting of unstable angina with recent non-ST elevation myocardial infarction and underlying severe coronary artery disease.S/p successful atherectomy and stenting of the proximal LAD and left mainstem with impella support by Dr Burt Knack 04/08/2019.  Assessment & Plan    1.  CAD with unstable angina She has extensive CAD.  S/p successful atherectomy and stenting of the proximal LAD and left main with impella support on Friday 8/28. Still has occluded RCA and disease in diagonals.  Continue ASA and ticagrelor Will intensify her medical regimen to treat angina Increase metoprolol to 75 mg bid.  Add Imdur 60 mg daily.  Holding off on ACEi due to acute on chronic renal failure and to allow more BP for titration of antianginal therapy.  May be able to add ACEi eventually If angina persists could add amlodipine or Ranexa.   2. Acute on chronic renal failure Improving. Last creatinine 1.56. will update tomorrow.   Holding ACEi as above  3. Diabetes Stable No change required today  4. Acute respiratory failure Improved  5. Acute on chronic systolic dysfunction/ ischemic CM Diuresis limited by renal failure. At time of cardiac cath EDP mildly elevated 21 mm Hg.  Optimization medical therapy for CAD as above Keep Is and Os negative as able  6. Hypertensive cardiorenal disease with CHF Adjust medication as noted.   Patient prognosis is guarded. High level of decision making required.       For questions or updates, please contact [place] Please consult [email or web address] for contact info under        Signed, Marcelina Mclaurin Martinique, MD  04/11/2019, 8:38 AM

## 2019-04-11 NOTE — Progress Notes (Signed)
Nutrition Follow-up  DOCUMENTATION CODES:   Obesity unspecified  INTERVENTION:   -MVI with minerals daily -1 packet Juven BID, each packet provides 95 calories, 2.5 grams of protein (collagen), and 9.8 grams of carbohydrate (3 grams sugar); also contains 7 grams of L-arginine and L-glutamine, 300 mg vitamin C, 15 mg vitamin E, 1.2 mcg vitamin B-12, 9.5 mg zinc, 200 mg calcium, and 1.5 g  Calcium Beta-hydroxy-Beta-methylbutyrate to support wound healing -Continue with carb modified diet  NUTRITION DIAGNOSIS:   Increased nutrient needs related to wound healing as evidenced by estimated needs.  Ongoing  GOAL:   Patient will meet greater than or equal to 90% of their needs  Progressing   MONITOR:   PO intake, Supplement acceptance, Labs, Weight trends, Skin, I & O's  REASON FOR ASSESSMENT:   Ventilator, Consult Enteral/tube feeding initiation and management  ASSESSMENT:   73 year old female who was admitted from [facility] with respiratory failure s/p intubation, acute pulmonary edema. Pt was discharged from [facility] on 8/25 after being admitted for NSTEMI. PMH of CAD, CHF, cardiac arrest in 2016, VT with ICD, and DM.  8/27- extubated 8/28- s/p Procedure(s): CORONARY ATHERECTOMY (N/A) Coronary Stent Intervention w/Impella (N/A  Reviewed I/O's: +50 ml x 24 hours and -2.6 L x 24 hours  UOP: 800 ml x 24 hours   Pt resting quietly at time of visit. Pt with good appetite; noted meal completion 50-100%. Pt with increased nutrient needs for wound healing and would benefit from addition of nutritional supplements.   Labs reviewed: CBGS: [id number] (inpatient orders for glycemic control are 0-5 units insulin aspart q HS and 0-9 units insulin aspart TID with meals).   Diet Order:   Diet Order            Diet Carb Modified Fluid consistency: Thin; Room service appropriate? Yes  Diet effective now              EDUCATION NEEDS:   No education needs have been identified at  this time  Skin:  Skin Assessment: Skin Integrity Issues: Skin Integrity Issues:: Stage II, Incisions Stage II: lt face, buttocks Incisions: right leg  Last BM:  04/07/19  Height:   Ht Readings from Last 1 Encounters:  04/09/19 5\' 9"  (1.753 m)    Weight:   Wt Readings from Last 1 Encounters:  04/11/19 93.4 kg    Ideal Body Weight:  61.4 kg  BMI:  Body mass index is 30.41 kg/m.  Estimated Nutritional Needs:   Kcal:  1900-2100  Protein:  100-115 grams  Fluid:  > 1.9L    Jenay Morici A. Jimmye [place], RD, LDN, [place] Registered Dietitian II Certified Diabetes Care and Education Specialist Pager: [phone] After hours Pager: [phone]

## 2019-04-11 NOTE — Evaluation (Signed)
Physical Therapy Evaluation Patient Details Name: Lori Rivera MRN: [id number] DOB: 05/09/1946 Today's Date: 04/11/2019   History of Present Illness  73 yo female admitted 8/26 with acute respiratory failure with flash pulmonary edema intubated 8/26-27. Pt s/p atherectomy and stent 8/28. Pt admitted to [facility] with NSTEMI 8/20-8/25. PMhx: CAD, CKD, DM, HTN, CHF, ICM with ICD, HLD  Clinical Impression  Pt admitted secondary to problem above with deficits below. Pt with notable weakness and fatigue during transfers and required min to mod A with use of RW. Pt currently lives alone and feel she is at increased risk for falls. Will continue to follow acutely to maximize functional mobility independence and safety.      Follow Up Recommendations SNF    Equipment Recommendations  None recommended by PT    Recommendations for Other Services       Precautions / Restrictions Precautions Precautions: Fall Restrictions Weight Bearing Restrictions: No RLE Weight Bearing: Non weight bearing      Mobility  Bed Mobility Overal bed mobility: Needs Assistance Bed Mobility: Sit to Supine       Sit to supine: Supervision   General bed mobility comments: Supervision for safety.   Transfers Overall transfer level: Needs assistance Equipment used: Rolling walker (2 wheeled) Transfers: Sit to/from [facility] Sit to Stand: Mod assist;Min assist Stand pivot transfers: Min assist       General transfer comment: Min to mod A for lift assist and steadying to come up to stand. Pt required 3 attempts to stand from recliner. Required min A for steadying to perform stand pivot to [facility] and then back to bed. Cues for sequencing using RW. Pt with increased fatigue following mobility and had ambulated with cardiac rehab, therefore further mobility deferred.   Ambulation/Gait                Stairs            Wheelchair Mobility    Modified Rankin (Stroke Patients  Only)       Balance Overall balance assessment: Needs assistance Sitting-balance support: No upper extremity supported;Feet supported Sitting balance-Leahy Scale: Fair     Standing balance support: Bilateral upper extremity supported;During functional activity Standing balance-Leahy Scale: Poor Standing balance comment: Reliant on BUE support                              Pertinent Vitals/Pain Pain Assessment: No/denies pain    Home Living Family/patient expects to be discharged to:: Private residence Living Arrangements: Alone Available Help at Discharge: Family;Available PRN/intermittently;Personal care attendant Type of Home: House Home Access: Ramped entrance     Home Layout: One level Home Equipment: Walker - 2 wheels;Cane - single point;Bedside commode      Prior Function Level of Independence: Needs assistance   Gait / Transfers Assistance Needed: Pt reports she had become independent with mobility and was not using AD.   ADL's / Homemaking Assistance Needed: Pt reports she has a caregiver that comes 2 days a week for a few hours to help with bathing and dressing, IADLs.         Hand Dominance        Extremity/Trunk Assessment   Upper Extremity Assessment Upper Extremity Assessment: Generalized weakness    Lower Extremity Assessment Lower Extremity Assessment: Generalized weakness    Cervical / Trunk Assessment Cervical / Trunk Assessment: Kyphotic  Communication   Communication: No difficulties  Cognition Arousal/Alertness:  Awake/alert Behavior During Therapy: WFL for tasks assessed/performed Overall Cognitive Status: Within Functional Limits for tasks assessed                                        General Comments      Exercises     Assessment/Plan    PT Assessment Patient needs continued PT services  PT Problem List Decreased strength;Decreased balance;Decreased mobility;Decreased activity  tolerance;Decreased knowledge of use of DME;Decreased knowledge of precautions       PT Treatment Interventions DME instruction;Gait training;Functional mobility training;Therapeutic activities;Therapeutic exercise;Balance training;Patient/family education    PT Goals (Current goals can be found in the Care Plan section)  Acute Rehab PT Goals Patient Stated Goal: to get stronger PT Goal Formulation: With patient Time For Goal Achievement: 04/25/19 Potential to Achieve Goals: Good    Frequency Min 2X/week   Barriers to discharge Decreased caregiver support      Co-evaluation               AM-PAC PT "6 Clicks" Mobility  Outcome Measure Help needed turning from your back to your side while in a flat bed without using bedrails?: A Little Help needed moving from lying on your back to sitting on the side of a flat bed without using bedrails?: A Little Help needed moving to and from a bed to a chair (including a wheelchair)?: A Little Help needed standing up from a chair using your arms (e.g., wheelchair or bedside chair)?: A Lot Help needed to walk in hospital room?: A Lot Help needed climbing 3-5 steps with a railing? : A Lot 6 Click Score: 15    End of Session Equipment Utilized During Treatment: Gait belt Activity Tolerance: Patient limited by fatigue Patient left: in bed;with call bell/phone within reach;with bed alarm set Nurse Communication: Mobility status PT Visit Diagnosis: Muscle weakness (generalized) (M62.81);Unsteadiness on feet (R26.81);Difficulty in walking, not elsewhere classified (R26.2)    Time: 3005-1102 PT Time Calculation (min) (ACUTE ONLY): 17 min   Charges:   PT Evaluation $PT Eval Moderate Complexity: [place], PT, DPT  Acute Rehabilitation Services  Pager: [phone] Office: [phone]   Rudean Hitt 04/11/2019, 4:28 PM

## 2019-04-12 DIAGNOSIS — L89153 Pressure ulcer of sacral region, stage 3: Secondary | ICD-10-CM

## 2019-04-12 LAB — CBC
HCT: 29 % — ABNORMAL LOW (ref 36.0–46.0)
Hemoglobin: 9.2 g/dL — ABNORMAL LOW (ref 12.0–15.0)
MCH: 29.6 pg (ref 26.0–34.0)
MCHC: 31.7 g/dL (ref 30.0–36.0)
MCV: 93.2 fL (ref 80.0–100.0)
Platelets: 219 10*3/uL (ref 150–400)
RBC: 3.11 MIL/uL — ABNORMAL LOW (ref 3.87–5.11)
RDW: 13.2 % (ref 11.5–15.5)
WBC: 8.2 10*3/uL (ref 4.0–10.5)
nRBC: 0 % (ref 0.0–0.2)

## 2019-04-12 LAB — BASIC METABOLIC PANEL
Anion gap: 8 (ref 5–15)
BUN: 28 mg/dL — ABNORMAL HIGH (ref 8–23)
CO2: 25 mmol/L (ref 22–32)
Calcium: 8.6 mg/dL — ABNORMAL LOW (ref 8.9–10.3)
Chloride: 105 mmol/L (ref 98–111)
Creatinine, Ser: 1.77 mg/dL — ABNORMAL HIGH (ref 0.44–1.00)
GFR calc Af Amer: 32 mL/min — ABNORMAL LOW (ref >60)
GFR calc non Af Amer: 28 mL/min — ABNORMAL LOW (ref >60)
Glucose, Bld: 236 mg/dL — ABNORMAL HIGH (ref 70–99)
Potassium: 4.1 mmol/L (ref 3.5–5.1)
Sodium: 138 mmol/L (ref 135–145)

## 2019-04-12 LAB — GLUCOSE, CAPILLARY
Glucose-Capillary: 160 mg/dL — ABNORMAL HIGH (ref 70–99)
Glucose-Capillary: 201 mg/dL — ABNORMAL HIGH (ref 70–99)
Glucose-Capillary: 261 mg/dL — ABNORMAL HIGH (ref 70–99)
Glucose-Capillary: 177 mg/dL — ABNORMAL HIGH (ref 70–99)

## 2019-04-12 LAB — PHOSPHORUS: Phosphorus: 3.4 mg/dL (ref 2.5–4.6)

## 2019-04-12 LAB — MAGNESIUM: Magnesium: 1.7 mg/dL (ref 1.7–2.4)

## 2019-04-12 NOTE — Progress Notes (Signed)
CARDIAC REHAB PHASE I   PRE:  Rate/Rhythm: 80 paced  BP:  Supine:   Sitting: 107/50  Standing: 122/51   SaO2: 99%RA  MODE:  Ambulation: 0 ft   POST:  Rate/Rhythm: 101 paced  BP:  Supine: 113/51  Sitting:   Standing:    SaO2: 99%RA 1004-1042 Came to see pt to walk. Pt stated she had walked with PT and did well. She stated that she felt that she could walk again. Took vitals and as pt was standing for [place] to put gait belt on, she began shaking and looking off. Slow to respond so had pt sit back down. Looked at telemetry and 101. Took BP at 122/51. Pt stated she had felt a little lightheaded. Able to answer questions and oriented. Looked pale. Assisted to bed. BP taken again at 113/51. A&O x4. No changes observed on monitor during our visit. Pt stated she just felt weak. Watching TV when we left. Put on bed alarm. Gave RN report about episode.   Graylon Good, RN BSN  04/12/2019 10:36 AM   2

## 2019-04-12 NOTE — Progress Notes (Signed)
PROGRESS NOTE  Lori Rivera:[id number] DOB: October 24, 1945 DOA: 04/07/2019 PCP: Garwin Brothers, MD  Brief History   Patient is a 73 year old female with extensive cardiac history.  Patient carries diagnosis of diabetes mellitus type 2, STEMI, and STEMI, ischemic cardiomyopathy, hypertension, CKD stage III, combined systolic and diastolic congestive heart failure and aortic regurgitation.  Patient presented to [facility] with chest pain and shortness of breath.  Patient was noted to be hypoxemic respiratory failure was transferred to [facility].  Patient was admitted under the ICU team and intubated.  Cardiology team directed cardiac care.  Patient underwent coronary angiography that revealed multivessel disease.  After due consideration, decision was taken to proceed with atherectomy and PCI rather than CABG.  Patient was transferred out of the ICU, and the hospitalist team has assumed primary care.  The plan now per cardiology is to optimize the patient's medical management. However, the patient is not receiving ACE I due to her acute on chronic renal failure and to allow for more BP for titration of antianginal therapy. Plan is for addition of amlodipine or Ranexa should the patient continue to have angina. The patient is being evaluated for cardiac rehab.  Consultants  . Cardiology  Procedures  . Atherectomy and PCI . LHC  Antibiotics   Anti-infectives (From admission, onward)   None    .  Marland Kitchen   Subjective  The patient is resting comfortably. No new complaints.  Objective   Vitals:  Vitals:   04/12/19 0908 04/12/19 1152  BP: (!) 122/43 (!) 115/48  Pulse: 81 74  Resp:  16  Temp:  98.5 F (36.9 C)  SpO2: 95% 100%    Exam:  Constitutional:  . Appears calm and comfortable Respiratory:  . CTA bilaterally, no w/r/r.  . Respiratory effort normal. No retractions or accessory muscle use Cardiovascular:  . RRR, no m/r/g . No LE extremity edema   . Normal pedal  pulses Abdomen:  . Abdomen appears normal; no tenderness or masses . No hernias . No HSM Musculoskeletal:  . Digits/nails BUE: no clubbing, cyanosis, petechiae, infection . exam of joints, bones, muscles of at least one of following: head/neck, RUE, LUE, RLE, LLE   o strength and tone normal, no atrophy, no abnormal movements o No tenderness, masses o Normal ROM, no contractures  . gait and station Skin:  . No rashes, lesions, ulcers . palpation of skin: no induration or nodules Neurologic:  . CN 2-12 intact . Sensation all 4 extremities intact   I have personally reviewed the following:   Today's Data  . Vitals, CBC, BMP   Scheduled Meds: . aspirin EC  81 mg Oral Daily  . atorvastatin  40 mg Oral q1800  . chlorhexidine  15 mL Mouth Rinse BID  . heparin  5,000 Units Subcutaneous Q8H  . insulin aspart  0-5 Units Subcutaneous QHS  . insulin aspart  0-9 Units Subcutaneous TID WC  . isosorbide mononitrate  60 mg Oral Daily  . mouth rinse  15 mL Mouth Rinse q12n4p  . metoprolol tartrate  75 mg Oral BID  . multivitamin with minerals  1 tablet Oral Daily  . nutrition supplement (JUVEN)  1 packet Oral BID BM  . sodium chloride flush  3 mL Intravenous Q12H  . ticagrelor  90 mg Oral BID   Continuous Infusions: . sodium chloride      Active Problems:   Acute on chronic systolic heart failure (HCC)   Acute respiratory failure with hypoxia (  [place])   Cardiogenic pulmonary edema (HCC)   Stage III pressure ulcer of sacral region ([place])   LOS: 5 days   A & P  Acute hypoxic respiratory failure due to acute exacerbation of congestive heart failure: Resolved.   Unstable anginal with severe three vessel CAD. S/P atherectomy and PCI for severe triple vessel disease. As per cardiology.   DM II: FSBS followed by SSI.  CKD IV: Avoid nephrotoxic substances and hypotension. Monitor creatinine electrolytes, and volume status.  DVT prophylaxis: Subacute heparin Code Status: Partial  Family Communication: Daughter Disposition Plan: This will depend on hospital course   Taran Haynesworth, DO [facility] Direct contact: see [email or web address]  7PM-7AM contact night coverage as above 04/12/2019, 4:59 PM  LOS: 5 days

## 2019-04-12 NOTE — NC FL2 (Signed)
[place] LEVEL OF CARE SCREENING TOOL     IDENTIFICATION  Patient Name: Lori Rivera Birthdate: 08/27/1945 Sex: female Admission Date (Current Location): 04/07/2019  [facility] and [place] Number:  Herbalist and Address:  [facility] [place] [address], [place], [place] [address]      Provider Number: [id number]  Attending Physician Name and Address:  Karie Kirks, DO  Relative Name and Phone Number:  Al Pimple [phone]    Current Level of Care: Hospital Recommended Level of Care: [place] Prior Approval Number:    Date Approved/Denied: 04/12/14 PASRR Number: [id number]  Discharge Plan: SNF    Current Diagnoses: Patient Active Problem List   Diagnosis Date Noted  . Acute respiratory failure with hypoxia ([place]) 04/07/2019  . Cardiogenic pulmonary edema ([place])   . Unstable angina ([place]) 03/31/2019  . Pressure injury of skin 03/31/2019  . Palpitations 01/17/2019  . PAT (paroxysmal atrial tachycardia) ([place]) 01/17/2019  . Acute renal failure superimposed on stage 3 chronic kidney disease ([place]) 11/27/2017  . Arthritis 09/24/2017  . CHF (congestive heart failure) ([place]) 09/24/2017  . Shortness of breath 09/24/2017  . Aortic regurgitation 09/24/2017  . Vitamin B 12 deficiency 09/24/2017  . Vitamin D deficiency 09/24/2017  . Essential hypertension 06/29/2017  . Coronary artery disease involving native heart with angina pectoris ([place]) 06/29/2017  . 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 Ischemic cardiomyopathy 11/18/2015  . Chronic combined systolic and diastolic heart failure ([place]) 08/19/2015  . Pneumonia due to infectious organism 08/19/2015  . Chest pain 08/08/2015  . PNA (pneumonia) 08/08/2015  . ST elevation myocardial infarction involving right coronary artery ([employer]) 08/03/2015  . STEMI (ST elevation myocardial infarction) ([place]) 08/03/2015  . Acute on chronic systolic  heart failure ([place]) 06/07/2015  . CKD (chronic kidney disease) stage 3, GFR 30-59 ml/min (HCC) 06/07/2015  . NSTEMI (non-ST elevated myocardial infarction) ([place]) 05/25/2015  . CAD in native artery 03/07/2015  . Hyperlipidemia 03/07/2015  . 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 Type 2 diabetes mellitus, without long-term current use of insulin ([place]) 03/07/2015    Orientation RESPIRATION BLADDER Height & Weight     Self, Time, Situation, Place  Normal Continent, External catheter Weight: 93 kg(scale a) Height:  5\' 9"  (175.3 cm)  BEHAVIORAL SYMPTOMS/MOOD NEUROLOGICAL BOWEL NUTRITION STATUS      Continent Diet(carb modified, fluid consisntency thin)  AMBULATORY STATUS COMMUNICATION OF NEEDS Skin   Limited Assist Verbally PU Stage and Appropriate Care(left face)   PU Stage 2 Dressing: (prn)                   Personal Care Assistance Level of Assistance  Bathing, Feeding, Dressing Bathing Assistance: Limited assistance Feeding assistance: Limited assistance Dressing Assistance: Limited assistance     Functional Limitations Info  Sight, Hearing, Speech Sight Info: Adequate Hearing Info: Adequate Speech Info: Adequate    SPECIAL CARE FACTORS FREQUENCY  PT (By licensed PT), OT (By licensed OT)     PT Frequency: 5x/week OT Frequency: 5x/week            Contractures Contractures Info: Not present    Additional Factors Info  Code Status, Allergies Code Status Info: partial Allergies Info: Codeine, Penicillins           Current Medications (04/12/2019):  This is the current hospital active medication list Current Facility-Administered Medications  Medication Dose Route Frequency Provider Last  Rate Last Dose  . 0.9 %  sodium chloride infusion  250 mL Intravenous PRN Sherren Mocha, MD      . aspirin EC tablet 81 mg  81 mg Oral Daily Jerline Pain, MD   81 mg at 04/12/19 0910  . atorvastatin (LIPITOR) tablet 40 mg   40 mg Oral q1800 Jerline Pain, MD   40 mg at 04/11/19 1703  . chlorhexidine (PERIDEX) 0.12 % solution 15 mL  15 mL Mouth Rinse BID Scatliffe, Rise Paganini, MD   15 mL at 04/12/19 0914  . docusate sodium (COLACE) capsule 100 mg  100 mg Oral BID PRN Lyndee Leo, [facility]      . heparin injection 5,000 Units  5,000 Units Subcutaneous Q8H Sherren Mocha, MD   5,000 Units at 04/12/19 0551  . insulin aspart (novoLOG) injection 0-5 Units  0-5 Units Subcutaneous QHS Dana Allan I, MD   2 Units at 04/10/19 2216  . insulin aspart (novoLOG) injection 0-9 Units  0-9 Units Subcutaneous TID WC Dana Allan I, MD   2 Units at 04/12/19 0551  . isosorbide mononitrate (IMDUR) 24 hr tablet 60 mg  60 mg Oral Daily [place], Peter M, MD   60 mg at 04/12/19 0911  . MEDLINE mouth rinse  15 mL Mouth Rinse q12n4p Scatliffe, Kristen D, MD   15 mL at 04/11/19 1512  . metoprolol tartrate (LOPRESSOR) tablet 75 mg  75 mg Oral BID [place], Peter M, MD   75 mg at 04/12/19 2992  . multivitamin with minerals tablet 1 tablet  1 tablet Oral Daily Dana Allan I, MD   1 tablet at 04/12/19 0911  . nitroGLYCERIN (NITROSTAT) SL tablet 0.4 mg  0.4 mg Sublingual Q5 min PRN Clois Dupes, MD   0.4 mg at 04/11/19 0846  . nutrition supplement (JUVEN) (JUVEN) powder packet 1 packet  1 packet Oral BID BM Bonnell Public, MD   1 packet at 04/12/19 0913  . ondansetron (ZOFRAN) injection 4 mg  4 mg Intravenous Q6H PRN Sherren Mocha, MD      . sodium chloride flush (NS) 0.9 % injection 3 mL  3 mL Intravenous Q12H Sherren Mocha, MD   3 mL at 04/12/19 0916  . sodium chloride flush (NS) 0.9 % injection 3 mL  3 mL Intravenous PRN Sherren Mocha, MD   3 mL at 04/11/19 2058  . ticagrelor (BRILINTA) tablet 90 mg  90 mg Oral BID Sherren Mocha, MD   90 mg at 04/12/19 0911     Discharge Medications: Please see discharge summary for a list of discharge medications.  Relevant Imaging Results:  Relevant Lab Results:   Additional  Information soc sec [id number]  Zenon Mayo, RN

## 2019-04-12 NOTE — Progress Notes (Signed)
Progress Note  Patient Name: Lori Rivera Date of Encounter: 04/12/2019  Primary Cardiologist: Shirlee More, MD   Subjective   Patient reports no chest pain since early yesterday. Breathing is good. Asking when she can go home.   Inpatient Medications    Scheduled Meds:  aspirin EC  81 mg Oral Daily   atorvastatin  40 mg Oral q1800   chlorhexidine  15 mL Mouth Rinse BID   heparin  5,000 Units Subcutaneous Q8H   insulin aspart  0-5 Units Subcutaneous QHS   insulin aspart  0-9 Units Subcutaneous TID WC   isosorbide mononitrate  60 mg Oral Daily   mouth rinse  15 mL Mouth Rinse q12n4p   metoprolol tartrate  75 mg Oral BID   multivitamin with minerals  1 tablet Oral Daily   nutrition supplement (JUVEN)  1 packet Oral BID BM   sodium chloride flush  3 mL Intravenous Q12H   ticagrelor  90 mg Oral BID   Continuous Infusions:  sodium chloride     PRN Meds: sodium chloride, docusate sodium, nitroGLYCERIN, ondansetron (ZOFRAN) IV, sodium chloride flush   Vital Signs    Vitals:   04/11/19 0851 04/11/19 0916 04/11/19 1920 04/12/19 0315  BP: (!) 100/45 (!) 116/37 (!) 117/58 (!) 120/46  Pulse: 90 83 91 80  Resp:   18 18  Temp:   97.8 F (36.6 C) 98.4 F (36.9 C)  TempSrc:   Oral Oral  SpO2: 95%  99% 97%  Weight:    93 kg  Height:        Intake/Output Summary (Last 24 hours) at 04/12/2019 0754 Last data filed at 04/12/2019 0120 Gross per 24 hour  Intake 616 ml  Output 800 ml  Net -184 ml   Last 3 Weights 04/12/2019 04/11/2019 04/10/2019  Weight (lbs) 205 lb 205 lb 14.4 oz 210 lb 6.4 oz  Weight (kg) 92.987 kg 93.396 kg 95.437 kg      Telemetry    NSR with PACs and PVCs.  - Personally Reviewed  ECG    Dated 04/10/19: sinus tachy with old septal infarct. Diffuse ST depression - Personally Reviewed  Physical Exam   GEN: Elderly WF frail. NAD Neck: No JVD Cardiac: RRR, soft 1/6 systolic murmur at the apex. No gallop. Respiratory: Clear to auscultation  bilaterally. GI: Soft, nontender, non-distended  MS: No edema; No deformity. Right femoral access site without hematoma. Extensive bruising on her arms.  Neuro:  Nonfocal  Psych: Normal affect   Labs    High Sensitivity Troponin:   Recent Labs  Lab 03/31/19 0750 04/07/19 0610 04/07/19 0908  TROPONINIHS 330* 183* 167*      Chemistry Recent Labs  Lab 04/07/19 0610 04/08/19 0222 04/08/19 1712 04/09/19 1859  NA 139 141  --  136  K 4.9 3.6  --  4.3  CL 104 106  --  103  CO2 26 25  --  23  GLUCOSE 230* 134*  --  286*  BUN 33* 30*  --  26*  CREATININE 1.96* 1.73* 1.69* 1.56*  CALCIUM 8.5* 8.1*  --  8.2*  PROT 5.9*  --   --   --   ALBUMIN 2.8*  --   --   --   AST 16  --   --   --   ALT 15  --   --   --   ALKPHOS 67  --   --   --   BILITOT 0.8  --   --   --  GFRNONAA 24* 28* 29* 32*  GFRAA 28* 33* 34* 37*  ANIONGAP 9 10  --  10     Hematology Recent Labs  Lab 04/10/19 0515 04/11/19 0607 04/12/19 0406  WBC 7.6 7.4 8.2  RBC 3.46* 3.37* 3.11*  HGB 10.2* 10.0* 9.2*  HCT 32.2* 31.5* 29.0*  MCV 93.1 93.5 93.2  MCH 29.5 29.7 29.6  MCHC 31.7 31.7 31.7  RDW 12.8 13.1 13.2  PLT 178 213 219    BNPNo results for input(s): BNP, PROBNP in the last 168 hours.   DDimer No results for input(s): DDIMER in the last 168 hours.   Radiology    No results found.  Cardiac Studies   LEFT HEART CATH AND CORONARY ANGIOGRAPHY  Conclusion   Severe three-vessel coronary disease including borderline significant left main disease.  The right coronary is heavily stented from proximal to distal and is totally occluded proximally.  The right coronary fills by left-to-right collaterals.  The left main dampens with engagement using a 5 French catheter.  There is 40 to 50% ostial to mid left main narrowing.  The LAD is heavily calcified and stented from proximal to distal.  At least 3 stents are noted.  There is diffuse in-stent restenosis in the proximal LAD with up to 50%  narrowing.  Just distal to the stent there is eccentric 85 to 90% stenosis.  An adjacent diagonal contains proximal diffuse 80 to 90% stenosis forming a [place] 011 bifurcation stenosis.  There is distal LAD disease beyond the third stent.  The circumflex is relatively small.  The first obtuse marginal/ramus contains segmental 95% stenosis from ostial to proximal and apparently has previously undergone attempted PCI and was labeled "non-dilatable".  The circumflex then becomes very small after the first marginal giving to small to moderate sized obtuse marginal branches with a 70% stenosis proximal to the third obtuse marginal.  Left ventricular systolic dysfunction with EF 35 to 40% with regional wall motion abnormality involving the anterior wall and inferior wall.  LVEDP is 21 mmHg consistent with chronic combined systolic and diastolic heart failure.  RECOMMENDATIONS:   Heart team approach: Need to determine surgical versus interventional versus medical therapy.  After completing the procedure and speaking to family members, apparently the patient has had altered cognitive function/interaction since her myocardial infarction.  She suffered cardiac arrest and had 12 minutes of CPR according to the daughter.  After discussion, perhaps PCI will be her revascularization approach but will be high risk.  At this point it appears that treating the LAD diagonal or simply the LAD is technically possible although with heavy calcification and prior history of non-dilatable lesion, perhaps an atheroablative technique should be considered.   Echo: IMPRESSIONS    1. The left ventricle has moderately reduced systolic function, with an ejection fraction of 35-40%. The cavity size was normal. Left ventricular diastolic Doppler parameters are consistent with impaired relaxation.  2. The right ventricle has normal systolic function. The cavity was normal. There is no increase in right ventricular wall  thickness.  3. The mitral valve is abnormal. Moderate thickening of the mitral valve leaflet. Moderate calcification of the mitral valve leaflet.  4. The tricuspid valve is grossly normal.  5. The aortic valve is abnormal. Moderate thickening of the aortic valve. Moderate calcification of the aortic valve. Aortic valve regurgitation is mild by color flow Doppler. Mild stenosis of the aortic valve.  6. The aorta is normal unless otherwise noted.  7. The aortic root and  ascending aorta are normal in size and structure.  8. The atrial septum is grossly normal.  Coronary Stent Intervention w/Impella  CORONARY ATHERECTOMY  Conclusion  Successful atherectomy and stenting of the proximal LAD and left mainstem with Impella CP hemodynamic support  Recommendations  Antiplatelet/Anticoag Recommend dual antiplatelet therapy with Aspirin 92m daily and Ticagrelor 942mtwice daily long-term (beyond 12 months) because of left main stenting/diabetes.  Surgeon Notes    03/31/2019 1:04 PM CV Procedure signed by SmBelva CromeMD  Indications  Non-ST elevation (NSTEMI) myocardial infarction ([place][I21.4 (ICD-10-CM)]  Procedural Details  Technical Details INDICATION: [age]ear old woman with severe multivessel coronary artery disease and recurrent acute pulmonary edema and elevated cardiac enzymes consistent with non-ST elevation infarction complicated by acute heart failure.  She underwent diagnostic catheterization demonstrating moderate left main stenosis, severe proximal LAD stenosis, and chronic total occlusion of the RCA with left-to-right collaterals.  Initial medical therapy was recommended because of the patient's relatively poor functional capacity.  She was not deemed candidate for consideration of cardiac surgery.  Unfortunately she had a recurrent respiratory failure event with acute pulmonary edema.  After review of treatment options, we elected to proceed with hemodynamically supported PCI of the  left mainstem and LAD.  PROCEDURAL DETAILS:  The right groin is prepped, draped, and anesthetized with 1% lidocaine. Using USKoreauidance, a micropuncture sheath is inserted into the right femoral artery.  A femoral angiogram is performed demonstrating appropriate position over the femoral head.  Double preclosed technique is used to preclosed the artery.  Using a 0.035 inch wire, the right femoral artery is progressively dilated and a 14 French Impella sheath is inserted under fluoroscopic guidance.  Angiomax was then started for anticoagulation.  The Impella catheter is inserted using normal technique into the left ventricle over the Impella wire which was changed out after recording LV pressure with a pigtail catheter.  Impella catheter position was deemed appropriate by fluoroscopic landmarks, hemodynamic data, and motor current waveforms.  At that point, the membrane of the hemostatic valve is punctured with a micropuncture needle and wire.  The membrane is then predilated with 7 FrPakistanilator.  I tried to advance a destination sheath but it would not advance far enough into the body even over a Lunderquist wire.  I was able to use a 6 FrPakistanhort sheath without difficulty.  A 6 FrPakistanBU guide was then advanced into the left coronary ostium with fairly marked pressure dampening.  The vessel was quickly wired with a cougar wire and guide catheter was then pulled back into the aorta.  I was then able to wire the vessel with a Viper flex wire without difficulty and this was advanced into the apical portion of the LAD.  CSI diamondback atherectomy was performed at low and high speed in both the proximal and mid LAD as well as the left main stem.  Multiple runs were made and the patient tolerated this very well from a hemodynamic perspective.  The CSI crown was then removed and the cougar wire was advanced back into the distal LAD.  The LAD and left main are both predilated with a 3.0 mm balloon.  The LAD had a  significant waist through the calcified lesion.  The LAD was then stented with a 3.0 x 28 mm Synergy drug-eluting stent.  The stent is poorly expanded throughout the proximal and midportion.  The stent is postdilated with a 3.25 mm noncompliant balloon to high pressure of 24 atm and  finally yielded at that pressure.  The left main is then stented with a 4.0 x 16 mm Synergy DES deployed at 16 atm.  The patient had a marked blood pressure drop during left main stent inflation.  The stent was deployed quickly and she recovered quickly.  The left main was then postdilated with a 4.5 mm noncompliant balloon to 16 atm.  The patient tolerated the entire procedure well.  Final angiography was performed.  The guide catheter and wire are removed.  The sheath is removed from the hemostatic membrane of the Impella sheath.  The Impella sheath was then removed and the Perclose sutures are tightened.     Patient Profile     73 y.o. female with flash pulmonary edema in the setting of unstable angina with recent non-ST elevation myocardial infarction and underlying severe coronary artery disease.S/p successful atherectomy and stenting of the proximal LAD and left mainstem with impella support by Dr Burt Knack 04/08/2019.  Assessment & Plan    1.  CAD with unstable angina She has extensive CAD.  S/p successful atherectomy and stenting of the proximal LAD and left main with impella support on Friday 8/28. Still has occluded RCA and disease in diagonals.  Continue ASA and ticagrelor Angina improved with titration of antianginal therapy On metoprolol to 75 mg bid and Imdur 60 mg daily.  Holding off on ACEi due to acute on chronic renal failure and to allow more BP for titration of antianginal therapy.  May be able to add ACEi eventually If angina persists could add amlodipine or Ranexa.   2. Acute on chronic renal failure Improving. Last creatinine 1.56. will update   Holding ACEi as above  3. Diabetes Stable No  change required today  4. Acute respiratory failure Improved  5. Acute on chronic systolic dysfunction/ ischemic CM At time of cardiac cath EDP mildly elevated 21 mm Hg.  Optimization medical therapy for CAD as above Keep Is and Os negative as able No need for lasix at this point.   6. Hypertensive cardiorenal disease with CHF Adjust medication as noted.   7. Deconditioning. Appreciate cardiac Rehab and PT input.       For questions or updates, please contact [place] Please consult [email or web address] for contact info under        Signed, Foy Mungia [place], MD  04/12/2019, 7:54 AM

## 2019-04-13 LAB — CBC
HCT: 30.3 % — ABNORMAL LOW (ref 36.0–46.0)
Hemoglobin: 10 g/dL — ABNORMAL LOW (ref 12.0–15.0)
MCH: 29.8 pg (ref 26.0–34.0)
MCHC: 33 g/dL (ref 30.0–36.0)
MCV: 90.2 fL (ref 80.0–100.0)
Platelets: 183 10*3/uL (ref 150–400)
RBC: 3.36 MIL/uL — ABNORMAL LOW (ref 3.87–5.11)
RDW: 13.2 % (ref 11.5–15.5)
WBC: 8.1 10*3/uL (ref 4.0–10.5)
nRBC: 0 % (ref 0.0–0.2)

## 2019-04-13 LAB — BASIC METABOLIC PANEL
Anion gap: 10 (ref 5–15)
BUN: 42 mg/dL — ABNORMAL HIGH (ref 8–23)
CO2: 25 mmol/L (ref 22–32)
Calcium: 8.6 mg/dL — ABNORMAL LOW (ref 8.9–10.3)
Chloride: 103 mmol/L (ref 98–111)
Creatinine, Ser: 1.72 mg/dL — ABNORMAL HIGH (ref 0.44–1.00)
GFR calc Af Amer: 33 mL/min — ABNORMAL LOW (ref >60)
GFR calc non Af Amer: 29 mL/min — ABNORMAL LOW (ref >60)
Glucose, Bld: 219 mg/dL — ABNORMAL HIGH (ref 70–99)
Potassium: 4.2 mmol/L (ref 3.5–5.1)
Sodium: 138 mmol/L (ref 135–145)

## 2019-04-13 LAB — GLUCOSE, CAPILLARY
Glucose-Capillary: 168 mg/dL — ABNORMAL HIGH (ref 70–99)
Glucose-Capillary: 281 mg/dL — ABNORMAL HIGH (ref 70–99)
Glucose-Capillary: 212 mg/dL — ABNORMAL HIGH (ref 70–99)
Glucose-Capillary: 189 mg/dL — ABNORMAL HIGH (ref 70–99)

## 2019-04-13 LAB — PHOSPHORUS: Phosphorus: 3.7 mg/dL (ref 2.5–4.6)

## 2019-04-13 LAB — MAGNESIUM: Magnesium: 1.9 mg/dL (ref 1.7–2.4)

## 2019-04-13 NOTE — Progress Notes (Signed)
Nutrition Follow-up  RD working remotely.  DOCUMENTATION CODES:   Obesity unspecified  INTERVENTION:   -Continue MVI with minerals daily -Continue1 packet Juven BID, each packet provides 95 calories, 2.5 grams of protein (collagen), and 9.8 grams of carbohydrate (3 grams sugar); also contains 7 grams of L-arginine and L-glutamine, 300 mg vitamin C, 15 mg vitamin E, 1.2 mcg vitamin B-12, 9.5 mg zinc, 200 mg calcium, and 1.5 g  Calcium Beta-hydroxy-Beta-methylbutyrate to support wound healing -Last documented BM on 04/07/19; consider initiation of bowel regimen  NUTRITION DIAGNOSIS:   Increased nutrient needs related to wound healing as evidenced by estimated needs.  Ongoing  GOAL:   Patient will meet greater than or equal to 90% of their needs  Progressing   MONITOR:   PO intake, Supplement acceptance, Labs, Weight trends, Skin, I & O's  REASON FOR ASSESSMENT:   Ventilator, Consult Enteral/tube feeding initiation and management  ASSESSMENT:   73 year old female who was admitted [facility] with respiratory failure s/p intubation, acute pulmonary edema. Pt was discharged from [facility] on 8/25 after being admitted for NSTEMI. PMH of CAD, CHF, cardiac arrest in 2016, VT with ICD, and DM.  8/27- extubated 8/28- s/p Procedure(s): CORONARY ATHERECTOMY (N/A) Coronary Stent Intervention w/Impella (N/A  Reviewed I/O's: +767 ml x 24 hours and -2 L since admission  UOP: 250 ml x 24 hours  Pt remains with good appetite; noted meal completion documented at 50-100%. Pt is taking MVI and Juven supplements.   Wt has been stable over the past week.   Per cardiac rehab notes, pt progressing well. Pt does not desire SNF placement; will d/c with home health services per [facility].  Labs reviewed: CBGS: 168-261 (inpatient orders for glycemic control are 0-5 units insulin aspart q HS and 0-9 units insulin aspart TID with meals).   Diet Order:   Diet Order            Diet Carb  Modified Fluid consistency: Thin; Room service appropriate? Yes  Diet effective now              EDUCATION NEEDS:   No education needs have been identified at this time  Skin:  Skin Assessment: Skin Integrity Issues: Skin Integrity Issues:: Stage II, Incisions Stage II: lt face, buttocks Incisions: right leg  Last BM:  04/07/19  Height:   Ht Readings from Last 1 Encounters:  04/09/19 5\' 9"  (1.753 m)    Weight:   Wt Readings from Last 1 Encounters:  04/13/19 93.4 kg    Ideal Body Weight:  61.4 kg  BMI:  Body mass index is 30.41 kg/m.  Estimated Nutritional Needs:   Kcal:  1900-2100  Protein:  100-115 grams  Fluid:  > 1.9L    Alaster Asfaw A. Jimmye [facility], RD, LDN, [place] Registered Dietitian II Certified Diabetes Care and Education Specialist Pager: [phone] After hours Pager: [phone]

## 2019-04-13 NOTE — Consult Note (Signed)
   [facility] CM Inpatient Consult   04/13/2019  Lori Rivera Mar 22, 1946 [id number]   Patient screened for unplanned hospitalizations within the past 7 days of admission and  to check if potential [place] [facility] services are needed. Patient in the Medicare ACO.   Review of patient's medical record reveals patient is Patient is a 73 year old female with extensive cardiac history. Patient carries diagnosis of diabetes mellitus type 2, STEMI, and STEMI, ischemic cardiomyopathy, hypertension, CKD stage III, combined systolic and diastolic congestive heart failure and aortic regurgitation. Patient presented to [facility] with chest pain and shortness of breath. Patient was noted to be hypoxemic respiratory failure was transferred to [facility]. Patient was admitted under the ICU team and intubated. Cardiology team directed cardiac care. Patient underwent coronary angiography that revealed multivessel disease. After due consideration, decision was taken to proceed with atherectomy and PCI rather than CABG  Primary Care Provider is   Garwin Brothers, MD, [place] is:  [facility]  Transportation to provider: daughter Plan: Patient was recommended for SNF however patient and daughter desires home with Home Health with [facility] per inpatient [facility] notes.  Spoke with the patient by hospital phone, HIPAA verified. Explained [place] [facility] for post hospital follow up.  Patient verbally consent to post hospital follow up. Patient also gives permission to speak with daughter Lori Rivera, if needed.  Will follow for progress, disposition and needs as appropriate.  For questions contact:   Natividad Brood, RN BSN [facility] Liaison  [phone] business mobile phone Toll free office [phone]  Fax number: [phone] [email or web address]        .

## 2019-04-13 NOTE — Progress Notes (Signed)
CARDIAC REHAB PHASE I   PRE:  Rate/Rhythm: 78 SR    BP: sitting 124/49    SaO2: 96 RA  MODE:  Ambulation: 220 ft   POST:  Rate/Rhythm: 112 pacing    BP: sitting 140/52     SaO2: 98 RA  Pt much stronger today. Able to stand and pivot to [facility] then ambulate with RW. Used gait belt and x2 assist for safety but minimal actual assist. Pts stride was longer, stronger, quicker. She did veer left during walk, continuously corrected herself. Rest x2. To recliner, no major c/o.   [id number]  Darrick Meigs CES, ACSM 04/13/2019 10:45 AM

## 2019-04-13 NOTE — TOC Progression Note (Addendum)
Transition of Care [facility]) - Progression Note    Patient Details  Name: [name] MRN: [id number] Date of Birth: 1946-01-12  Transition of Care [facility]) CM/SW Contact  Zenon Mayo, RN Phone Number: 04/13/2019, 7:22 AM  Clinical Narrative:    From home alone, acute resp failure, chest pain, a/c renal failure, chf, Referral given to [facility] for HHPT/HHOT, per per eval rec SNF, patient and daughter does not want SNF, TOC team will continue to follow for TOC needs.         Expected Discharge Plan and Services           Expected Discharge Date: 04/12/19                                     Social Determinants of Health (SDOH) Interventions    Readmission Risk Interventions No flowsheet data found.

## 2019-04-13 NOTE — Progress Notes (Signed)
Progress Note  Patient Name: Lori Rivera Date of Encounter: 04/13/2019  Primary Cardiologist: Shirlee More, MD   Subjective   Patient reports no recurrent chest pain. Feels good. Admits she is still quite weak. Breathing is good. Asking when she can go home.   Inpatient Medications    Scheduled Meds:  aspirin EC  81 mg Oral Daily   atorvastatin  40 mg Oral q1800   chlorhexidine  15 mL Mouth Rinse BID   heparin  5,000 Units Subcutaneous Q8H   insulin aspart  0-5 Units Subcutaneous QHS   insulin aspart  0-9 Units Subcutaneous TID WC   isosorbide mononitrate  60 mg Oral Daily   mouth rinse  15 mL Mouth Rinse q12n4p   metoprolol tartrate  75 mg Oral BID   multivitamin with minerals  1 tablet Oral Daily   nutrition supplement (JUVEN)  1 packet Oral BID BM   sodium chloride flush  3 mL Intravenous Q12H   ticagrelor  90 mg Oral BID   Continuous Infusions:  sodium chloride     PRN Meds: sodium chloride, docusate sodium, nitroGLYCERIN, ondansetron (ZOFRAN) IV, sodium chloride flush   Vital Signs    Vitals:   04/12/19 0908 04/12/19 1152 04/12/19 1949 04/13/19 0353  BP: (!) 122/43 (!) 115/48 (!) 109/41 (!) 102/38  Pulse: 81 74 74 73  Resp:  _0 Temp:  98.5 F (36.9 C) 98.4 F (36.9 C) 98 F (36.7 C)  TempSrc:  Oral Oral Oral  SpO2: 95% 100% 100% 97%  Weight:    93.4 kg  Height:        Intake/Output Summary (Last 24 hours) at 04/13/2019 0803 Last data filed at 04/13/2019 2778 Gross per 24 hour  Intake 937 ml  Output 250 ml  Net 687 ml   Last 3 Weights 04/13/2019 04/12/2019 04/11/2019  Weight (lbs) 205 lb 14.4 oz 205 lb 205 lb 14.4 oz  Weight (kg) 93.396 kg 92.987 kg 93.396 kg      Telemetry    NSR with PACs.  - Personally Reviewed  ECG    none  Physical Exam   GEN: Elderly WF frail. NAD Neck: No JVD Cardiac: RRR, soft 1/6 systolic murmur at the apex. No gallop. Respiratory: Clear to auscultation bilaterally. GI: Soft, nontender,  non-distended  MS: No edema; No deformity. Neuro:  Nonfocal  Psych: Normal affect   Labs    High Sensitivity Troponin:   Recent Labs  Lab 03/31/19 0750 04/07/19 0610 04/07/19 0908  TROPONINIHS 330* 183* 167*      Chemistry Recent Labs  Lab 04/07/19 0610 04/08/19 0222 04/08/19 1712 04/09/19 1859 04/12/19 0940  NA 139 141  --  136 138  K 4.9 3.6  --  4.3 4.1  CL 104 106  --  103 105  CO2 26 25  --  23 25  GLUCOSE 230* 134*  --  286* 236*  BUN 33* 30*  --  26* 28*  CREATININE 1.96* 1.73* 1.69* 1.56* 1.77*  CALCIUM 8.5* 8.1*  --  8.2* 8.6*  PROT 5.9*  --   --   --   --   ALBUMIN 2.8*  --   --   --   --   AST 16  --   --   --   --   ALT 15  --   --   --   --   ALKPHOS 67  --   --   --   --  BILITOT 0.8  --   --   --   --   GFRNONAA 24* 28* 29* 32* 28*  GFRAA 28* 33* 34* 37* 32*  ANIONGAP 9 10  --  10 8     Hematology Recent Labs  Lab 04/11/19 0607 04/12/19 0406 04/13/19 0441  WBC 7.4 8.2 8.1  RBC 3.37* 3.11* 3.36*  HGB 10.0* 9.2* 10.0*  HCT 31.5* 29.0* 30.3*  MCV 93.5 93.2 90.2  MCH 29.7 29.6 29.8  MCHC 31.7 31.7 33.0  RDW 13.1 13.2 13.2  PLT 213 219 183    BNPNo results for input(s): BNP, PROBNP in the last 168 hours.   DDimer No results for input(s): DDIMER in the last 168 hours.   Radiology    No results found.  Cardiac Studies   LEFT HEART CATH AND CORONARY ANGIOGRAPHY  Conclusion   Severe three-vessel coronary disease including borderline significant left main disease.  The right coronary is heavily stented from proximal to distal and is totally occluded proximally.  The right coronary fills by left-to-right collaterals.  The left main dampens with engagement using a 5 French catheter.  There is 40 to 50% ostial to mid left main narrowing.  The LAD is heavily calcified and stented from proximal to distal.  At least 3 stents are noted.  There is diffuse in-stent restenosis in the proximal LAD with up to 50% narrowing.  Just distal to the  stent there is eccentric 85 to 90% stenosis.  An adjacent diagonal contains proximal diffuse 80 to 90% stenosis forming a [place] 011 bifurcation stenosis.  There is distal LAD disease beyond the third stent.  The circumflex is relatively small.  The first obtuse marginal/ramus contains segmental 95% stenosis from ostial to proximal and apparently has previously undergone attempted PCI and was labeled "non-dilatable".  The circumflex then becomes very small after the first marginal giving to small to moderate sized obtuse marginal branches with a 70% stenosis proximal to the third obtuse marginal.  Left ventricular systolic dysfunction with EF 35 to 40% with regional wall motion abnormality involving the anterior wall and inferior wall.  LVEDP is 21 mmHg consistent with chronic combined systolic and diastolic heart failure.  RECOMMENDATIONS:   Heart team approach: Need to determine surgical versus interventional versus medical therapy.  After completing the procedure and speaking to family members, apparently the patient has had altered cognitive function/interaction since her myocardial infarction.  She suffered cardiac arrest and had 12 minutes of CPR according to the daughter.  After discussion, perhaps PCI will be her revascularization approach but will be high risk.  At this point it appears that treating the LAD diagonal or simply the LAD is technically possible although with heavy calcification and prior history of non-dilatable lesion, perhaps an atheroablative technique should be considered.   Echo: IMPRESSIONS    1. The left ventricle has moderately reduced systolic function, with an ejection fraction of 35-40%. The cavity size was normal. Left ventricular diastolic Doppler parameters are consistent with impaired relaxation.  2. The right ventricle has normal systolic function. The cavity was normal. There is no increase in right ventricular wall thickness.  3. The mitral valve is  abnormal. Moderate thickening of the mitral valve leaflet. Moderate calcification of the mitral valve leaflet.  4. The tricuspid valve is grossly normal.  5. The aortic valve is abnormal. Moderate thickening of the aortic valve. Moderate calcification of the aortic valve. Aortic valve regurgitation is mild by color flow Doppler. Mild stenosis of  the aortic valve.  6. The aorta is normal unless otherwise noted.  7. The aortic root and ascending aorta are normal in size and structure.  8. The atrial septum is grossly normal.  Coronary Stent Intervention w/Impella  CORONARY ATHERECTOMY  Conclusion  Successful atherectomy and stenting of the proximal LAD and left mainstem with Impella CP hemodynamic support  Recommendations  Antiplatelet/Anticoag Recommend dual antiplatelet therapy with Aspirin 24m daily and Ticagrelor 950mtwice daily long-term (beyond 12 months) because of left main stenting/diabetes.  Surgeon Notes    03/31/2019 1:04 PM CV Procedure signed by SmBelva CromeMD  Indications  Non-ST elevation (NSTEMI) myocardial infarction (HCPort [place]ue[I21.4 (ICD-10-CM)]  Procedural Details  Technical Details INDICATION: [age]ear old woman with severe multivessel coronary artery disease and recurrent acute pulmonary edema and elevated cardiac enzymes consistent with non-ST elevation infarction complicated by acute heart failure.  She underwent diagnostic catheterization demonstrating moderate left main stenosis, severe proximal LAD stenosis, and chronic total occlusion of the RCA with left-to-right collaterals.  Initial medical therapy was recommended because of the patient's relatively poor functional capacity.  She was not deemed candidate for consideration of cardiac surgery.  Unfortunately she had a recurrent respiratory failure event with acute pulmonary edema.  After review of treatment options, we elected to proceed with hemodynamically supported PCI of the left mainstem and LAD.  PROCEDURAL  DETAILS:  The right groin is prepped, draped, and anesthetized with 1% lidocaine. Using USKoreauidance, a micropuncture sheath is inserted into the right femoral artery.  A femoral angiogram is performed demonstrating appropriate position over the femoral head.  Double preclosed technique is used to preclosed the artery.  Using a 0.035 inch wire, the right femoral artery is progressively dilated and a 14 French Impella sheath is inserted under fluoroscopic guidance.  Angiomax was then started for anticoagulation.  The Impella catheter is inserted using normal technique into the left ventricle over the Impella wire which was changed out after recording LV pressure with a pigtail catheter.  Impella catheter position was deemed appropriate by fluoroscopic landmarks, hemodynamic data, and motor current waveforms.  At that point, the membrane of the hemostatic valve is punctured with a micropuncture needle and wire.  The membrane is then predilated with 7 FrPakistanilator.  I tried to advance a destination sheath but it would not advance far enough into the body even over a Lunderquist wire.  I was able to use a 6 FrPakistanhort sheath without difficulty.  A 6 FrPakistanBU guide was then advanced into the left coronary ostium with fairly marked pressure dampening.  The vessel was quickly wired with a cougar wire and guide catheter was then pulled back into the aorta.  I was then able to wire the vessel with a Viper flex wire without difficulty and this was advanced into the apical portion of the LAD.  CSI diamondback atherectomy was performed at low and high speed in both the proximal and mid LAD as well as the left main stem.  Multiple runs were made and the patient tolerated this very well from a hemodynamic perspective.  The CSI crown was then removed and the cougar wire was advanced back into the distal LAD.  The LAD and left main are both predilated with a 3.0 mm balloon.  The LAD had a significant waist through the  calcified lesion.  The LAD was then stented with a 3.0 x 28 mm Synergy drug-eluting stent.  The stent is poorly expanded throughout the proximal and midportion.  The stent is postdilated with a 3.25 mm noncompliant balloon to high pressure of 24 atm and finally yielded at that pressure.  The left main is then stented with a 4.0 x 16 mm Synergy DES deployed at 16 atm.  The patient had a marked blood pressure drop during left main stent inflation.  The stent was deployed quickly and she recovered quickly.  The left main was then postdilated with a 4.5 mm noncompliant balloon to 16 atm.  The patient tolerated the entire procedure well.  Final angiography was performed.  The guide catheter and wire are removed.  The sheath is removed from the hemostatic membrane of the Impella sheath.  The Impella sheath was then removed and the Perclose sutures are tightened.     Patient Profile     73 y.o. female with flash pulmonary edema in the setting of unstable angina with recent non-ST elevation myocardial infarction and underlying severe coronary artery disease.S/p successful atherectomy and stenting of the proximal LAD and left mainstem with impella support by Dr Burt Knack 04/08/2019.  Assessment & Plan    1.  CAD with unstable angina She has extensive CAD.  S/p successful atherectomy and stenting of the proximal LAD and left main with impella support on Friday 8/28. Still has occluded RCA and disease in diagonals.  Continue ASA and ticagrelor Angina improved with titration of antianginal therapy On metoprolol to 75 mg bid and Imdur 60 mg daily.  Holding off on ACEi due to acute on chronic renal failure and to allow more BP for titration of antianginal therapy.  May be able to add ACEi eventually  2. Acute on chronic renal failure Improving. Last creatinine 177 yesterday. Pending today.  Holding ACEi as above. Has not needed diuresis for several days and weight is stable.   3. Diabetes Stable No change  required today  4. Acute respiratory failure Improved  5. Acute on chronic systolic dysfunction/ ischemic CM At time of cardiac cath EDP mildly elevated 21 mm Hg.  Optimization medical therapy for CAD as above Keep Is and Os negative as able. Overall negative 2 liters since admit. Weight is stable.  No need for lasix at this point.   6. Hypertensive cardiorenal disease with CHF Adjust medication as noted.   7. Deconditioning. Appreciate cardiac Rehab and PT input. Anticipate need for SNF.       For questions or updates, please contact [place] Please consult [email or web address] for contact info under        Signed, Johney Perotti [place], MD  04/13/2019, 8:03 AM

## 2019-04-13 NOTE — Progress Notes (Addendum)
PROGRESS NOTE  Lori Rivera:[id number] DOB: 1946/01/13 DOA: 04/07/2019 PCP: Garwin Brothers, MD  Brief History   Patient is a 73 year old female with extensive cardiac history.  Patient carries diagnosis of diabetes mellitus type 2, STEMI, and STEMI, ischemic cardiomyopathy, hypertension, CKD stage III, combined systolic and diastolic congestive heart failure and aortic regurgitation.  Patient presented to [facility] with chest pain and shortness of breath.  Patient was noted to be hypoxemic respiratory failure was transferred to [facility].  Patient was admitted under the ICU team and intubated.  Cardiology team directed cardiac care.  Patient underwent coronary angiography that revealed multivessel disease.  After due consideration, decision was taken to proceed with atherectomy and PCI rather than CABG.  Patient was transferred out of the ICU, and the hospitalist team has assumed primary care.  The plan now per cardiology is to optimize the patient's medical management. However, the patient is not receiving ACE I due to her acute on chronic renal failure and to allow for more BP for titration of antianginal therapy. Plan is for addition of amlodipine or Ranexa should the patient continue to have angina. The patient is being evaluated for cardiac rehab.  Consultants  . Cardiology  Procedures  . Atherectomy and PCI . LHC  Antibiotics   Anti-infectives (From admission, onward)   None     .   Subjective  The patient is resting comfortably. No new complaints.  Objective   Vitals:  Vitals:   04/13/19 0822 04/13/19 1151  BP: (!) 108/42 (!) 111/47  Pulse: 80 70  Resp: 18 20  Temp: 98.3 F (36.8 C) 98.3 F (36.8 C)  SpO2: 96% 97%    Exam:  Constitutional:  . Appears calm and comfortable Respiratory:  . CTA bilaterally, no w/r/r.  . Respiratory effort normal. No retractions or accessory muscle use Cardiovascular:  . RRR, no m/r/g . No LE extremity edema   .  Normal pedal pulses Abdomen:  . Abdomen appears normal; no tenderness or masses . No hernias . No HSM Musculoskeletal:  . Digits/nails BUE: no clubbing, cyanosis, petechiae, infection . exam of joints, bones, muscles of at least one of following: head/neck, RUE, LUE, RLE, LLE   o strength and tone normal, no atrophy, no abnormal movements o No tenderness, masses o Normal ROM, no contractures  . gait and station Skin:  . No rashes, lesions, ulcers . palpation of skin: no induration or nodules Neurologic:  . CN 2-12 intact . Sensation all 4 extremities intact   I have personally reviewed the following:   Today's Data  . Vitals, CBC, BMP   Scheduled Meds: . aspirin EC  81 mg Oral Daily  . atorvastatin  40 mg Oral q1800  . chlorhexidine  15 mL Mouth Rinse BID  . heparin  5,000 Units Subcutaneous Q8H  . insulin aspart  0-5 Units Subcutaneous QHS  . insulin aspart  0-9 Units Subcutaneous TID WC  . isosorbide mononitrate  60 mg Oral Daily  . mouth rinse  15 mL Mouth Rinse q12n4p  . metoprolol tartrate  75 mg Oral BID  . multivitamin with minerals  1 tablet Oral Daily  . nutrition supplement (JUVEN)  1 packet Oral BID BM  . sodium chloride flush  3 mL Intravenous Q12H  . ticagrelor  90 mg Oral BID   Continuous Infusions: . sodium chloride      Active Problems:   Acute on chronic systolic heart failure (HCC)   Acute respiratory failure  with hypoxia ([place])   Cardiogenic pulmonary edema (HCC)   Stage III pressure ulcer of sacral region ([place])   LOS: 6 days   A & P  Acute hypoxic respiratory failure due to acute exacerbation of congestive heart failure: Resolved.   Unstable anginal with severe three vessel CAD. S/P atherectomy and PCI for severe triple vessel disease. As per cardiology.   DM II: FSBS followed by SSI.  CKD IV: Avoid nephrotoxic substances and hypotension. Monitor creatinine electrolytes, and volume status.  Debility: SNF recommended by physical therapy,  but patient and family dont want it. Home soon.  I have seen and examined this patient myself. I have spent 34 minutes in her evaluation and care.  DVT prophylaxis: Subacute heparin Code Status: Partial Family Communication: Daughter Disposition Plan: This will depend on hospital course   Eudora Guevarra, DO [facility] Direct contact: see [email or web address]  7PM-7AM contact night coverage as above 04/13/2019, 4:39 PM  LOS: 5 days

## 2019-04-14 ENCOUNTER — Telehealth: Payer: Self-pay | Admitting: Family

## 2019-04-14 DIAGNOSIS — L89153 Pressure ulcer of sacral region, stage 3: Secondary | ICD-10-CM

## 2019-04-14 LAB — BASIC METABOLIC PANEL
Anion gap: 11 (ref 5–15)
BUN: 53 mg/dL — ABNORMAL HIGH (ref 8–23)
CO2: 23 mmol/L (ref 22–32)
Calcium: 8.6 mg/dL — ABNORMAL LOW (ref 8.9–10.3)
Chloride: 104 mmol/L (ref 98–111)
Creatinine, Ser: 1.82 mg/dL — ABNORMAL HIGH (ref 0.44–1.00)
GFR calc Af Amer: 31 mL/min — ABNORMAL LOW (ref >60)
GFR calc non Af Amer: 27 mL/min — ABNORMAL LOW (ref >60)
Glucose, Bld: 208 mg/dL — ABNORMAL HIGH (ref 70–99)
Potassium: 4.2 mmol/L (ref 3.5–5.1)
Sodium: 138 mmol/L (ref 135–145)

## 2019-04-14 LAB — CBC
HCT: 28.2 % — ABNORMAL LOW (ref 36.0–46.0)
Hemoglobin: 9.1 g/dL — ABNORMAL LOW (ref 12.0–15.0)
MCH: 30.1 pg (ref 26.0–34.0)
MCHC: 32.3 g/dL (ref 30.0–36.0)
MCV: 93.4 fL (ref 80.0–100.0)
Platelets: 227 10*3/uL (ref 150–400)
RBC: 3.02 MIL/uL — ABNORMAL LOW (ref 3.87–5.11)
RDW: 13.3 % (ref 11.5–15.5)
WBC: 8.8 10*3/uL (ref 4.0–10.5)
nRBC: 0 % (ref 0.0–0.2)

## 2019-04-14 LAB — GLUCOSE, CAPILLARY
Glucose-Capillary: 176 mg/dL — ABNORMAL HIGH (ref 70–99)
Glucose-Capillary: 274 mg/dL — ABNORMAL HIGH (ref 70–99)
Glucose-Capillary: 235 mg/dL — ABNORMAL HIGH (ref 70–99)

## 2019-04-14 MED ORDER — ADULT MULTIVITAMIN W/MINERALS CH: 1.0000 | ORAL_TABLET | Freq: Every day | ORAL | 0 refills | Status: DC

## 2019-04-14 MED ORDER — JUVEN PO PACK: 1.0000 | PACK | Freq: Two times a day (BID) | ORAL | 0 refills | Status: DC

## 2019-04-14 MED ORDER — TICAGRELOR 90 MG PO TABS: 90.0000 mg | ORAL_TABLET | Freq: Two times a day (BID) | ORAL | 0 refills | Status: DC

## 2019-04-14 MED ORDER — METOPROLOL TARTRATE 75 MG PO TABS: 75.0000 mg | ORAL_TABLET | Freq: Two times a day (BID) | ORAL | 0 refills | Status: DC

## 2019-04-14 MED ORDER — ISOSORBIDE MONONITRATE ER 60 MG PO TB24: 60.0000 mg | ORAL_TABLET | Freq: Every day | ORAL | 0 refills | Status: DC

## 2019-04-14 MED ORDER — ATORVASTATIN CALCIUM 40 MG PO TABS: 40.0000 mg | ORAL_TABLET | Freq: Every day | ORAL | 0 refills | Status: DC

## 2019-04-14 MED FILL — BRILINTA 90 MG TABLET: 90 | 30 days supply | Qty: 60 | Fill #0

## 2019-04-14 MED FILL — ISOSORBIDE MN ER 60 MG TAB: 60 | 30 days supply | Qty: 30 | Fill #0

## 2019-04-14 MED FILL — ATORVASTATIN CALCIUM 40 MG: 40 | 30 days supply | Qty: 30 | Fill #0

## 2019-04-14 MED FILL — METOPROLOL TARTRATE 25 MG T: 25 | 30 days supply | Qty: 180 | Fill #0

## 2019-04-14 NOTE — Telephone Encounter (Signed)
FYI

## 2019-04-14 NOTE — Progress Notes (Signed)
CARDIAC REHAB PHASE I   PRE:  Rate/Rhythm: 70 pacing    BP: sitting 125/56    SaO2: 97 RA  MODE:  Ambulation: 280 ft   POST:  Rate/Rhythm: 97 pacing    BP: sitting 155/60     SaO2: 97 RA  PT up in recliner, sts she already walked. Agreeable to walk again. Stood on third attempt independently. Used RW in hall, standby assist x2 with gait belt. She tolerated fairly well most of the walk but began to fatigue at end. Needed reminders to stand tall, look straight. She continues to veer RW to left but is able to correct. She was exhausted after walk and return to bed. Able to get her legs in bed independently. Gave pt reminders for antiplatelet, walking as tolerated, NTG, wearing Life Alert, and low sodium. Voiced understanding. Encouraged her to ask her daughter to come by more.  [phone]   [place], ACSM 04/14/2019 10:34 AM

## 2019-04-14 NOTE — Progress Notes (Signed)
Inpatient Diabetes Program Recommendations  AACE/ADA: New Consensus Statement on Inpatient Glycemic Control   Target Ranges:  Prepandial:   less than 140 mg/dL      Peak postprandial:   less than 180 mg/dL (1-2 hours)      Critically ill patients:  140 - 180 mg/dL   Results for REBEKKA, LOBELLO (MRN [id number]) as of 04/14/2019 09:23  Ref. Range 04/13/2019 05:41 04/13/2019 11:00 04/13/2019 16:52 04/13/2019 21:49 04/14/2019 06:29  Glucose-Capillary Latest Ref Range: 70 - 99 mg/dL 168 (H) 281 (H) 212 (H) 189 (H) 176 (H)   Review of Glycemic Control  Diabetes history: DM2 Outpatient Diabetes medications: Tresiba 10 units daily, Jardiance 10 mg daily Current orders for Inpatient glycemic control: Novolog 0-9 units TID with meals, Novolog 0-5 units QHS  Inpatient Diabetes Program Recommendations:   Insulin-Meal Coverage: Please consider ordering Novolog 3 units TID with meals for meal coverage if patient eats at least 50% of meals.  Thanks, Barnie Alderman, RN, MSN, CDE Diabetes Coordinator Inpatient Diabetes Program [phone] (Team Pager from 8am to 5pm)

## 2019-04-14 NOTE — Progress Notes (Signed)
PT Cancellation Note  Patient Details Name: Lori Rivera MRN: [id number] DOB: 19-Aug-1945   Cancelled Treatment:    Reason Eval/Treat Not Completed: Other (comment)- patient getting ready to be discharged. Reports she walked already and declines walking again at this time.    Davion Meara 04/14/2019, 2:58 PM

## 2019-04-14 NOTE — Telephone Encounter (Signed)
° ° °  TOC appt made 9/16

## 2019-04-14 NOTE — Discharge Instructions (Signed)
PLEASE REMEMBER TO BRING ALL OF YOUR MEDICATIONS TO EACH OF YOUR FOLLOW-UP OFFICE VISITS.  PLEASE ATTEND ALL SCHEDULED FOLLOW-UP APPOINTMENTS.   Activity: Increase activity slowly as tolerated. You may shower, but no soaking baths (or swimming) for 1 week. No driving for 24 hours. No lifting over 5 lbs for 1 week. No sexual activity for 1 week.   You May Return to Work: in 1 week (if applicable)  Wound Care: You may wash cath site gently with soap and water. Keep cath site clean and dry. If you notice pain, swelling, bleeding or pus at your cath site, please call [phone].  Femoral Site Care This sheet gives you information about how to care for yourself after your procedure. Your health care provider may also give you more specific instructions. If you have problems or questions, contact your health care provider. What can I expect after the procedure? After the procedure, it is common to have:  Bruising that usually fades within 1-2 weeks.  Tenderness at the site. Follow these instructions at home: Wound care  Follow instructions from your health care provider about how to take care of your insertion site. Make sure you: ? Wash your hands with soap and water before you change your bandage (dressing). If soap and water are not available, use hand sanitizer. ? Change your dressing as told by your health care provider. ? Leave stitches (sutures), skin glue, or adhesive strips in place. These skin closures may need to stay in place for 2 weeks or longer. If adhesive strip edges start to loosen and curl up, you may trim the loose edges. Do not remove adhesive strips completely unless your health care provider tells you to do that.  Do not take baths, swim, or use a hot tub until your health care provider approves.  You may shower 24-48 hours after the procedure or as told by your health care provider. ? Gently wash the site with plain soap and water. ? Pat the area dry with a clean  towel. ? Do not rub the site. This may cause bleeding.  Do not apply powder or lotion to the site. Keep the site clean and dry.  Check your femoral site every day for signs of infection. Check for: ? Redness, swelling, or pain. ? Fluid or blood. ? Warmth. ? Pus or a bad smell. Activity  For the first 2-3 days after your procedure, or as long as directed: ? Avoid climbing stairs as much as possible. ? Do not squat.  Do not lift anything that is heavier than 10 lb (4.5 kg), or the limit that you are told, until your health care provider says that it is safe.  Rest as directed. ? Avoid sitting for a long time without moving. Get up to take short walks every 1-2 hours.  Do not drive for 24 hours if you were given a medicine to help you relax (sedative). General instructions  Take over-the-counter and prescription medicines only as told by your health care provider.  Keep all follow-up visits as told by your health care provider. This is important. Contact a health care provider if you have:  A fever or chills.  You have redness, swelling, or pain around your insertion site. Get help right away if:  The catheter insertion area swells very fast.  You pass out.  You suddenly start to sweat or your skin gets clammy.  The catheter insertion area is bleeding, and the bleeding does not stop when you hold  steady pressure on the area.  The area near or just beyond the catheter insertion site becomes pale, cool, tingly, or numb. These symptoms may represent a serious problem that is an emergency. Do not wait to see if the symptoms will go away. Get medical help right away. Call your local emergency services (911 in the [place] Do not drive yourself to the hospital. Summary  After the procedure, it is common to have bruising that usually fades within 1-2 weeks.  Check your femoral site every day for signs of infection.  Do not lift anything that is heavier than 10 lb (4.5 kg), or  the limit that you are told, until your health care provider says that it is safe. This information is not intended to replace advice given to you by your health care provider. Make sure you discuss any questions you have with your health care provider. Document Released: 03/31/2014 Document Revised: 08/10/2017 Document Reviewed: 08/10/2017 Elsevier Patient Education  2020 [employer].

## 2019-04-14 NOTE — Care Management Important Message (Signed)
Important Message  Patient Details  Name: Lori Rivera MRN: [id number] Date of Birth: 1945-09-24   Medicare Important Message Given:  Yes     Shelda Altes 04/14/2019, 11:31 AM

## 2019-04-14 NOTE — Progress Notes (Signed)
Patient alert and oriented, denies pain. Patient home meds  was not deliver MD paged, prescription printed and fax to the patient pharmacy in [place] to give pt her night time meds before d/c, pt BP was low . Night shift NP was notified. NP instruct to tell patient and family that patient should not take the meds tonight due to low BP. Iv and tele d/c, instruction explain and given to the patient daughter. Patient d/c per order.

## 2019-04-14 NOTE — Progress Notes (Signed)
Progress Note  Patient Name: Lori Rivera Date of Encounter: 04/14/2019  Primary Cardiologist: Shirlee More, MD   Subjective   Patient reports no recurrent chest pain. Feels good. Was more active with therapy yesterday.  Breathing is good.   Inpatient Medications    Scheduled Meds: . aspirin EC  81 mg Oral Daily  . atorvastatin  40 mg Oral q1800  . chlorhexidine  15 mL Mouth Rinse BID  . heparin  5,000 Units Subcutaneous Q8H  . insulin aspart  0-5 Units Subcutaneous QHS  . insulin aspart  0-9 Units Subcutaneous TID WC  . isosorbide mononitrate  60 mg Oral Daily  . mouth rinse  15 mL Mouth Rinse q12n4p  . metoprolol tartrate  75 mg Oral BID  . multivitamin with minerals  1 tablet Oral Daily  . nutrition supplement (JUVEN)  1 packet Oral BID BM  . sodium chloride flush  3 mL Intravenous Q12H  . ticagrelor  90 mg Oral BID   Continuous Infusions: . sodium chloride     PRN Meds: sodium chloride, docusate sodium, nitroGLYCERIN, ondansetron (ZOFRAN) IV, sodium chloride flush   Vital Signs    Vitals:   04/13/19 1151 04/13/19 2014 04/14/19 0427 04/14/19 0433  BP: (!) 111/47 (!) 106/43  (!) 123/54  Pulse: 70 75  71  Resp: 20 18  18   Temp: 98.3 F (36.8 C) 98.3 F (36.8 C)  98.2 F (36.8 C)  TempSrc: Oral Oral  Oral  SpO2: 97% 97%  98%  Weight:   93.8 kg   Height:        Intake/Output Summary (Last 24 hours) at 04/14/2019 0718 Last data filed at 04/13/2019 2022 Gross per 24 hour  Intake 1320 ml  Output 700 ml  Net 620 ml   Last 3 Weights 04/14/2019 04/13/2019 04/12/2019  Weight (lbs) 206 lb 12.8 oz 205 lb 14.4 oz 205 lb  Weight (kg) 93.804 kg 93.396 kg 92.987 kg      Telemetry    NSR with PACs.  - Personally Reviewed  ECG    none  Physical Exam   GEN: Elderly WF frail. NAD Neck: No JVD Cardiac: RRR, soft 1/6 systolic murmur at the apex. No gallop. Respiratory: Clear to auscultation bilaterally. GI: Soft, nontender, non-distended  MS: No edema; No  deformity. Neuro:  Nonfocal  Psych: Normal affect   Labs    High Sensitivity Troponin:   Recent Labs  Lab 03/31/19 0750 04/07/19 0610 04/07/19 0908  TROPONINIHS 330* 183* 167*      Chemistry Recent Labs  Lab 04/12/19 0940 04/13/19 0702 04/14/19 0509  NA 138 138 138  K 4.1 4.2 4.2  CL 105 103 104  CO2 25 25 23   GLUCOSE 236* 219* 208*  BUN 28* 42* 53*  CREATININE 1.77* 1.72* 1.82*  CALCIUM 8.6* 8.6* 8.6*  GFRNONAA 28* 29* 27*  GFRAA 32* 33* 31*  ANIONGAP 8 10 11      Hematology Recent Labs  Lab 04/12/19 0406 04/13/19 0441 04/14/19 0509  WBC 8.2 8.1 8.8  RBC 3.11* 3.36* 3.02*  HGB 9.2* 10.0* 9.1*  HCT 29.0* 30.3* 28.2*  MCV 93.2 90.2 93.4  MCH 29.6 29.8 30.1  MCHC 31.7 33.0 32.3  RDW 13.2 13.2 13.3  PLT 219 183 227    BNPNo results for input(s): BNP, PROBNP in the last 168 hours.   DDimer No results for input(s): DDIMER in the last 168 hours.   Radiology    No results found.  Cardiac  Studies   LEFT HEART CATH AND CORONARY ANGIOGRAPHY  Conclusion   Severe three-vessel coronary disease including borderline significant left main disease.  The right coronary is heavily stented from proximal to distal and is totally occluded proximally.  The right coronary fills by left-to-right collaterals.  The left main dampens with engagement using a 5 French catheter.  There is 40 to 50% ostial to mid left main narrowing.  The LAD is heavily calcified and stented from proximal to distal.  At least 3 stents are noted.  There is diffuse in-stent restenosis in the proximal LAD with up to 50% narrowing.  Just distal to the stent there is eccentric 85 to 90% stenosis.  An adjacent diagonal contains proximal diffuse 80 to 90% stenosis forming a [place] 011 bifurcation stenosis.  There is distal LAD disease beyond the third stent.  The circumflex is relatively small.  The first obtuse marginal/ramus contains segmental 95% stenosis from ostial to proximal and apparently has  previously undergone attempted PCI and was labeled "non-dilatable".  The circumflex then becomes very small after the first marginal giving to small to moderate sized obtuse marginal branches with a 70% stenosis proximal to the third obtuse marginal.  Left ventricular systolic dysfunction with EF 35 to 40% with regional wall motion abnormality involving the anterior wall and inferior wall.  LVEDP is 21 mmHg consistent with chronic combined systolic and diastolic heart failure.  RECOMMENDATIONS:   Heart team approach: Need to determine surgical versus interventional versus medical therapy.  After completing the procedure and speaking to family members, apparently the patient has had altered cognitive function/interaction since her myocardial infarction.  She suffered cardiac arrest and had 12 minutes of CPR according to the daughter.  After discussion, perhaps PCI will be her revascularization approach but will be high risk.  At this point it appears that treating the LAD diagonal or simply the LAD is technically possible although with heavy calcification and prior history of non-dilatable lesion, perhaps an atheroablative technique should be considered.   Echo: IMPRESSIONS    1. The left ventricle has moderately reduced systolic function, with an ejection fraction of 35-40%. The cavity size was normal. Left ventricular diastolic Doppler parameters are consistent with impaired relaxation.  2. The right ventricle has normal systolic function. The cavity was normal. There is no increase in right ventricular wall thickness.  3. The mitral valve is abnormal. Moderate thickening of the mitral valve leaflet. Moderate calcification of the mitral valve leaflet.  4. The tricuspid valve is grossly normal.  5. The aortic valve is abnormal. Moderate thickening of the aortic valve. Moderate calcification of the aortic valve. Aortic valve regurgitation is mild by color flow Doppler. Mild stenosis of the  aortic valve.  6. The aorta is normal unless otherwise noted.  7. The aortic root and ascending aorta are normal in size and structure.  8. The atrial septum is grossly normal.  Coronary Stent Intervention w/Impella  CORONARY ATHERECTOMY  Conclusion  Successful atherectomy and stenting of the proximal LAD and left mainstem with Impella CP hemodynamic support  Recommendations  Antiplatelet/Anticoag Recommend dual antiplatelet therapy with Aspirin 71m daily and Ticagrelor 965mtwice daily long-term (beyond 12 months) because of left main stenting/diabetes.  Surgeon Notes    03/31/2019 1:04 PM CV Procedure signed by SmBelva CromeMD  Indications  Non-ST elevation (NSTEMI) myocardial infarction (HCEmelle[I21.4 (ICD-10-CM)]  Procedural Details  Technical Details INDICATION: [id number] old woman with severe multivessel coronary artery disease and recurrent acute pulmonary edema  and elevated cardiac enzymes consistent with non-ST elevation infarction complicated by acute heart failure.  She underwent diagnostic catheterization demonstrating moderate left main stenosis, severe proximal LAD stenosis, and chronic total occlusion of the RCA with left-to-right collaterals.  Initial medical therapy was recommended because of the patient's relatively poor functional capacity.  She was not deemed candidate for consideration of cardiac surgery.  Unfortunately she had a recurrent respiratory failure event with acute pulmonary edema.  After review of treatment options, we elected to proceed with hemodynamically supported PCI of the left mainstem and LAD.  PROCEDURAL DETAILS:  The right groin is prepped, draped, and anesthetized with 1% lidocaine. Using US guidance, a micropuncture sheath is inserted into the right femoral artery.  A femoral angiogram is performed demonstrating appropriate position over the femoral head.  Double preclosed technique is used to preclosed the artery.  Using a 0.035 inch wire, the  right femoral artery is progressively dilated and a 14 French Impella sheath is inserted under fluoroscopic guidance.  Angiomax was then started for anticoagulation.  The Impella catheter is inserted using normal technique into the left ventricle over the Impella wire which was changed out after recording LV pressure with a pigtail catheter.  Impella catheter position was deemed appropriate by fluoroscopic landmarks, hemodynamic data, and motor current waveforms.  At that point, the membrane of the hemostatic valve is punctured with a micropuncture needle and wire.  The membrane is then predilated with 7 [place] dilator.  I tried to advance a destination sheath but it would not advance far enough into the body even over a Lunderquist wire.  I was able to use a 6 [place] short sheath without difficulty.  A 6 [place] EBU guide was then advanced into the left coronary ostium with fairly marked pressure dampening.  The vessel was quickly wired with a cougar wire and guide catheter was then pulled back into the aorta.  I was then able to wire the vessel with a Viper flex wire without difficulty and this was advanced into the apical portion of the LAD.  CSI diamondback atherectomy was performed at low and high speed in both the proximal and mid LAD as well as the left main stem.  Multiple runs were made and the patient tolerated this very well from a hemodynamic perspective.  The CSI crown was then removed and the cougar wire was advanced back into the distal LAD.  The LAD and left main are both predilated with a 3.0 mm balloon.  The LAD had a significant waist through the calcified lesion.  The LAD was then stented with a 3.0 x 28 mm Synergy drug-eluting stent.  The stent is poorly expanded throughout the proximal and midportion.  The stent is postdilated with a 3.25 mm noncompliant balloon to high pressure of 24 atm and finally yielded at that pressure.  The left main is then stented with a 4.0 x 16 mm Synergy DES deployed  at 16 atm.  The patient had a marked blood pressure drop during left main stent inflation.  The stent was deployed quickly and she recovered quickly.  The left main was then postdilated with a 4.5 mm noncompliant balloon to 16 atm.  The patient tolerated the entire procedure well.  Final angiography was performed.  The guide catheter and wire are removed.  The sheath is removed from the hemostatic membrane of the Impella sheath.  The Impella sheath was then removed and the Perclose sutures are tightened.     Patient Profile  73 y.o. female with flash pulmonary edema in the setting of unstable angina with recent non-ST elevation myocardial infarction and underlying severe coronary artery disease.S/p successful atherectomy and stenting of the proximal LAD and left mainstem with impella support by Dr Burt Knack 04/08/2019.  Assessment & Plan    1.  CAD with unstable angina She has extensive CAD.  S/p successful atherectomy and stenting of the proximal LAD and left main with impella support on Friday 8/28. Still has occluded RCA and disease in diagonals.  Continue ASA and ticagrelor for at least one year - probably continue DAPT indefinitely.  Angina improved with titration of antianginal therapy On metoprolol to 75 mg bid and Imdur 60 mg daily.  Holding off on ACEi due to acute on CKD stage 4 with fluctuating creatinine levels.  2. Acute on chronic renal failure-stage IV Creatinine still fluctuating. Does not appear volume overloaded. Continue to hold ACEi, diuretics.  - weight is stable.   3. Diabetes Stable No change required today  4. Acute respiratory failure Improved  5. Acute on chronic systolic dysfunction/ ischemic CM At time of cardiac cath EDP mildly elevated 21 mm Hg.  Optimization medical therapy for CAD as above Weight is stable.  No need for lasix at this point.   6. Hypertensive cardiorenal disease with CHF Adjust medication as noted.   7. Deconditioning.  Anticipate DC home with HH therapy  [facility] will sign off.   Medication Recommendations:  Continue per [facility] Other recommendations (labs, testing, etc):  none Follow up as an outpatient:  With Dr Shirlee More in 2 weeks.        For questions or updates, please contact [place] Please consult [email or web address] for contact info under        Signed, Zhanna Melin [place], MD  04/14/2019, 7:18 AM

## 2019-04-14 NOTE — Discharge Summary (Signed)
Physician Discharge Summary  Lori Rivera IPJ:[id number] DOB: 1945/11/04 DOA: 04/07/2019  PCP: Garwin Brothers, MD  Admit date: 04/07/2019 Discharge date: 04/14/2019  Recommendations for Outpatient Follow-up:  1. Follow up with PCP in 7-10 days 2. Follow up with cardiology on 04/27/2019 3. Have chemistry drawn on 04/21/2019 and have it reported to PCP. 4. She will have home health PT/OT.  Follow-up Information    Loel Dubonnet, NP Follow up on 04/27/2019.   Specialty: Cardiology Why: Please arrive 15 minutes early for your 1pm post-hospital follow-up appointment. You will be seen at the [facility] office as there were no available appt times in [place] for you to be seen within 2 weeks.  Contact information: [address] [place] [address] [phone]        Garwin Brothers, MD. Go on 04/25/2019.   Specialty: Internal Medicine Why: @2 :30 Contact information: [address] [place] [address] [phone]        Home, [facility] Follow up.   Specialty: [place] Why: PT,OT Contact information: [address] [place] [address] [phone]            Discharge Diagnoses: Principal diagnosis is #1 1. Acute hypoxic respiratory failure 2. DM II 3. Unstable angina 4. CKD IV 5. Debility  Discharge Condition: Fair Disposition: Home with home health  Diet recommendation: Hear healthy with carbohydrate control  Filed Weights   04/12/19 0315 04/13/19 0353 04/14/19 0427  Weight: 93 kg 93.4 kg 93.8 kg    History of present illness:  73 year old female with PMH as below, which is significant for CAD, HFrEF (40-45%), Cardiac arrest in 2016, VT with ICD, and DM. She was admitted to [facility] from 8/20 to 8/25 for NSTEMI. She underwent cardiac catheterization 8/20, which demonstrated severe three-vessel CAD. There was some consideration for CABG, however, after meeting with CVTS the patient determined she would not want  to undergo such a major operation. Felt to be high risk for PCI. She was recommended medical management and was discharged to home.   Then on 8/26 she again developed chest pain and dyspnea. Symptoms did not improve with sublingual nitroglycerine and she called EMS. Upon arrival she was satting poorly and was started on CPAP. Upon arrival to [facility] ED O2 sats were in the 60s and she required intubation. EKG demonstrated lateral ST depression. Heparin was started. Imaging concerning for pulmonary edema. She briefly required norepinephrine immediately after intubation. She was transferred to [facility] for ICU admission.     Hospital Course:  Patient was admitted under the ICU team and intubated. Cardiology team directed cardiac care. Patient underwent coronary angiography that revealed multivessel disease. After due consideration, decision was taken to proceed with atherectomy and PCI rather than CABG. Patient was transferred out of the ICU, and the hospitalist team has assumed primary care.  The plan now per cardiology is to optimize the patient's medical management. However, the patient is not receiving ACE I due to her acute on chronic renal failure and to allow for more BP for titration of antianginal therapy. Plan is for addition of amlodipine or Ranexa should the patient continue to have angina. The patient will be discharged to home with home health and follow up with cardiology as outpatient.   Today's assessment: S: The patient is resting comfortably. No new complaints. O: Vitals:  Vitals:   04/14/19 0947 04/14/19 1335  BP: 104/71 Marland Kitchen)  112/41  Pulse: 76 66  Resp: 18 18  Temp: 98.5 F (36.9 C) 98.1 F (36.7 C)  SpO2: 98% 100%    Constitutional:   Appears calm and comfortable Respiratory:   CTA bilaterally, no w/r/r.   Respiratory effort normal. No retractions or accessory muscle use Cardiovascular:   RRR, no m/r/g  No LE extremity edema    Normal pedal  pulses Abdomen:   Abdomen appears normal; no tenderness or masses  No hernias  No HSM Musculoskeletal:   Digits/nails BUE: no clubbing, cyanosis, petechiae, infection  exam of joints, bones, muscles of at least one of following: head/neck, RUE, LUE, RLE, LLE   ? strength and tone normal, no atrophy, no abnormal movements ? No tenderness, masses ? Normal ROM, no contractures   gait and station Skin:   No rashes, lesions, ulcers  palpation of skin: no induration or nodules Neurologic:   CN 2-12 intact  Sensation all 4 extremities intact   Discharge Instructions  Discharge Instructions    (HEART FAILURE PATIENTS) Call MD:  Anytime you have any of the following symptoms: 1) 3 pound weight gain in 24 hours or 5 pounds in 1 week 2) shortness of breath, with or without a dry hacking cough 3) swelling in the hands, feet or stomach 4) if you have to sleep on extra pillows at night in order to breathe.   Complete by: As directed    Call MD for:  difficulty breathing, headache or visual disturbances   Complete by: As directed    Call MD for:  extreme fatigue   Complete by: As directed    Call MD for:  persistant dizziness or light-headedness   Complete by: As directed    Diet - low sodium heart healthy   Complete by: As directed    Discharge instructions   Complete by: As directed    Follow up with PCP in 7-10 days. Follow up with Dr. Bettina Gavia in 2 weeks. Chemistry to be drawn on 04/21/2019. Please report results to PCP.   Heart Failure patients record your daily weight using the same scale at the same time of day   Complete by: As directed    Increase activity slowly   Complete by: As directed    STOP any activity that causes chest pain, shortness of breath, dizziness, sweating, or exessive weakness   Complete by: As directed      Allergies as of 04/14/2019      Reactions   Codeine Other (See Comments)   confused   Penicillins Rash   Did it involve swelling of the  face/tongue/throat, SOB, or low BP? Yes Did it involve sudden or severe rash/hives, skin peeling, or any reaction on the inside of your mouth or nose? No Did you need to seek medical attention at a hospital or doctor's office? Yes When did it last happen?20 yrs ago If all above answers are NO, may proceed with cephalosporin use.      Medication List    STOP taking these medications   clopidogrel 75 MG tablet Commonly known as: PLAVIX   furosemide 40 MG tablet Commonly known as: LASIX   Jardiance 10 MG Tabs tablet Generic drug: empagliflozin   potassium chloride 10 MEQ CR capsule Commonly known as: MICRO-K     TAKE these medications   aspirin EC 81 MG tablet Take 81 mg by mouth daily.   atorvastatin 40 MG tablet Commonly known as: LIPITOR Take 1 tablet (40 mg total) by mouth  daily at 6 PM. What changed:   medication strength  how much to take   isosorbide mononitrate 60 MG 24 hr tablet Commonly known as: IMDUR Take 1 tablet (60 mg total) by mouth daily. Start taking on: April 15, 2019   Metoprolol Tartrate 75 MG Tabs Take 75 mg by mouth 2 (two) times daily. What changed:   medication strength  how much to take  when to take this   multivitamin with minerals Tabs tablet Take 1 tablet by mouth daily. Start taking on: April 15, 2019   nitroGLYCERIN 0.4 MG SL tablet Commonly known as: NITROSTAT Place 0.4 mg under the tongue every 5 (five) minutes as needed.   nutrition supplement (JUVEN) Pack Take 1 packet by mouth 2 (two) times daily between meals.   ticagrelor 90 MG Tabs tablet Commonly known as: BRILINTA Take 1 tablet (90 mg total) by mouth 2 (two) times daily.   Tyler Aas FlexTouch 100 UNIT/ML Sopn FlexTouch Pen Generic drug: insulin degludec Inject 10 Units into the skin daily.      Allergies  Allergen Reactions   Codeine Other (See Comments)    confused   Penicillins Rash    Did it involve swelling of the face/tongue/throat,  SOB, or low BP? Yes Did it involve sudden or severe rash/hives, skin peeling, or any reaction on the inside of your mouth or nose? No Did you need to seek medical attention at a hospital or doctor's office? Yes When did it last happen?20 yrs ago If all above answers are NO, may proceed with cephalosporin use.     The results of significant diagnostics from this hospitalization (including imaging, microbiology, ancillary and laboratory) are listed below for reference.    Significant Diagnostic Studies: Dg Chest Port 1 View  Result Date: 04/07/2019 CLINICAL DATA:  73 year old female. Evaluate for endotracheal and enteric tube placement. EXAM: PORTABLE CHEST 1 VIEW COMPARISON:  Earlier radiograph dated 04/06/2019 FINDINGS: Endotracheal tube with tip approximately 4 cm above the carina. Enteric tube extends below the diaphragm with tip beyond the inferior margin of the image. Trace bilateral pleural effusions may be present. There is bibasilar atelectatic changes. Pneumonia is not excluded. Clinical correlation is recommended. No pneumothorax. The cardiac silhouette is within normal limits. Coronary vascular calcification or stent. Left pectoral pacemaker device. No acute osseous pathology. Partially visualized cervical ACDF. IMPRESSION: 1. Endotracheal tube above the carina. Enteric tube extends below the diaphragm with tip beyond the inferior margin of the image. 2. Bibasilar atelectatic changes. Pneumonia is not excluded. Electronically Signed   By: Anner Crete M.D.   On: 04/07/2019 04:07    Microbiology: Recent Results (from the past 240 hour(s))  SARS Coronavirus 2 [facility] order, Performed in [place] hospital lab)     Status: None   Collection Time: 04/07/19  3:36 AM  Result Value Ref Range Status   SARS Coronavirus 2 NEGATIVE NEGATIVE Final    Comment: (NOTE) If result is NEGATIVE SARS-CoV-2 target nucleic acids are NOT DETECTED. The SARS-CoV-2 RNA is generally  detectable in upper and lower  respiratory specimens during the acute phase of infection. The lowest  concentration of SARS-CoV-2 viral copies this assay can detect is 250  copies / mL. A negative result does not preclude SARS-CoV-2 infection  and should not be used as the sole basis for treatment or other  patient management decisions.  A negative result may occur with  improper specimen collection / handling, submission of specimen other  than nasopharyngeal swab, presence of  viral mutation(s) within the  areas targeted by this assay, and inadequate number of viral copies  (<250 copies / mL). A negative result must be combined with clinical  observations, patient history, and epidemiological information. If result is POSITIVE SARS-CoV-2 target nucleic acids are DETECTED. The SARS-CoV-2 RNA is generally detectable in upper and lower  respiratory specimens dur ing the acute phase of infection.  Positive  results are indicative of active infection with SARS-CoV-2.  Clinical  correlation with patient history and other diagnostic information is  necessary to determine patient infection status.  Positive results do  not rule out bacterial infection or co-infection with other viruses. If result is PRESUMPTIVE POSTIVE SARS-CoV-2 nucleic acids MAY BE PRESENT.   A presumptive positive result was obtained on the submitted specimen  and confirmed on repeat testing.  While 2019 novel coronavirus  (SARS-CoV-2) nucleic acids may be present in the submitted sample  additional confirmatory testing may be necessary for epidemiological  and / or clinical management purposes  to differentiate between  SARS-CoV-2 and other Sarbecovirus currently known to infect humans.  If clinically indicated additional testing with an alternate test  methodology [phone]) is advised. The SARS-CoV-2 RNA is generally  detectable in upper and lower respiratory sp ecimens during the acute  phase of infection. The  expected result is Negative. Fact Sheet for Patients:  StrictlyIdeas.no Fact Sheet for Healthcare Providers: BankingDealers.co.za This test is not yet approved or cleared by the [place] FDA and has been authorized for detection and/or diagnosis of SARS-CoV-2 by FDA under an Emergency Use Authorization (EUA).  This EUA will remain in effect (meaning this test can be used) for the duration of the COVID-19 declaration under Section 564(b)(1) of the Act, 21 U.S.C. section 360bbb-3(b)(1), unless the authorization is terminated or revoked sooner. Performed at [facility], [place] [address], [place], [place] [address]      Labs: Basic Metabolic Panel: Recent Labs  Lab 04/08/19 0222 04/08/19 1712 04/09/19 1859 04/10/19 0515 04/11/19 1937 04/12/19 0406 04/12/19 0940 04/13/19 0702 04/14/19 0509  NA 141  --  136  --   --   --  138 138 138  K 3.6  --  4.3  --   --   --  4.1 4.2 4.2  CL 106  --  103  --   --   --  105 103 104  CO2 25  --  23  --   --   --  25 25 23   GLUCOSE 134*  --  286*  --   --   --  236* 219* 208*  BUN 30*  --  26*  --   --   --  28* 42* 53*  CREATININE 1.73* 1.69* 1.56*  --   --   --  1.77* 1.72* 1.82*  CALCIUM 8.1*  --  8.2*  --   --   --  8.6* 8.6* 8.6*  MG  --   --   --  1.7 1.7 1.7  --  1.9  --   PHOS  --   --   --  3.0 3.3 3.4  --  3.7  --    Liver Function Tests: No results for input(s): AST, ALT, ALKPHOS, BILITOT, PROT, ALBUMIN in the last 168 hours. No results for input(s): LIPASE, AMYLASE in the last 168 hours. No results for input(s): AMMONIA in the last 168 hours. CBC: Recent Labs  Lab 04/10/19 0515 04/11/19 9024 04/12/19 0406 04/13/19 0441 04/14/19  0509  WBC 7.6 7.4 8.2 8.1 8.8  HGB 10.2* 10.0* 9.2* 10.0* 9.1*  HCT 32.2* 31.5* 29.0* 30.3* 28.2*  MCV 93.1 93.5 93.2 90.2 93.4  PLT 178 213 219 183 227   Cardiac Enzymes: No results for input(s): CKTOTAL, CKMB, CKMBINDEX, TROPONINI in  the last 168 hours. BNP: BNP (last 3 results) Recent Labs    04/03/19 2240  BNP 332.0*    ProBNP (last 3 results) No results for input(s): PROBNP in the last 8760 hours.  CBG: Recent Labs  Lab 04/13/19 1100 04/13/19 1652 04/13/19 2149 04/14/19 0629 04/14/19 1128  GLUCAP 281* 212* 189* 176* 274*    Active Problems:   Acute on chronic systolic heart failure (HCC)   Acute respiratory failure with hypoxia (HCC)   Cardiogenic pulmonary edema (HCC)   Stage III pressure ulcer of sacral region [facility])   Time coordinating discharge: 38 minutes.  Signed:        Bernece Gall, DO [facility]  04/14/2019, 2:22 PM

## 2019-04-14 NOTE — Telephone Encounter (Signed)
Thank you :)

## 2019-04-15 ENCOUNTER — Other Ambulatory Visit: Payer: Self-pay | Admitting: *Deleted

## 2019-04-21 ENCOUNTER — Other Ambulatory Visit: Payer: Self-pay

## 2019-04-21 NOTE — Patient Outreach (Signed)
[facility]) Care Management  04/21/2019  Lori Rivera [birth year] [id number]  Transition of care Referral date: 04/15/19 Referral source: Hospital liaison Referral reason:  Transition of care complex case management Insurance: medicare  Outreach attempt #  Telephone call to patient regarding transition of care referral. HIPAA verified. RNCM explained reason for call.  Patient states she was in the hospital due to severe chest pain and heart attack. Patient states she saw her primary MD a few days ago for follow up. Patient reports having transportation to her appointments. She states her daughter is her caregiver. Patient states she has shortness of breath at rest and swelling in her ankles and feet.  She reports her doctor is aware of this.  Patient gave verbal permission to speak with her daughter, [name] regarding her health care.  Patient's daughter states patient lives alone but she lives in walking distance.  She states she left her job to provide full time care to patient. Daughter states patient saw her primary MD on 04/17/19 and  Is scheduled to follow up with the cardiologist within 1 month. Daughter reports patient is taking her medications as prescribed. Daughter states home health is providing physical and occupational therapy to patient. RNCM discussed signs/ symptoms of CHF. RNCM discussed and offered [facility] care management services. Daughter agreed to have RNCM follow up with her and/ or patient. RNCM verified mailing address with daughter to send EMMI education material and [facility] welcome packet.  RNCM advised patient to notify MD of any changes in condition prior to scheduled appointment. RNCM provided contact name and number: [phone] or main office number [phone] and 24 hour nurse advise line [phone] by mail.  RNCM verified patient aware of 911 services for urgent/ emergent needs.  PLAN:  RNCM will follow up with daughter within 1 week.  RNCM  will send [facility] welcome packet and EMMI education material to patient/ daughter RNCM will send involvement letter to patients primary MD   Quinn Plowman RN,BSN,CCM [facility] Telephonic  [phone]

## 2019-04-27 ENCOUNTER — Ambulatory Visit (INDEPENDENT_AMBULATORY_CARE_PROVIDER_SITE_OTHER): Payer: Medicare Other | Admitting: Family

## 2019-04-27 DIAGNOSIS — N183 Chronic kidney disease, stage 3 (moderate): Secondary | ICD-10-CM

## 2019-04-27 DIAGNOSIS — E782 Mixed hyperlipidemia: Secondary | ICD-10-CM

## 2019-04-27 DIAGNOSIS — Z95810 Presence of automatic (implantable) cardiac defibrillator: Secondary | ICD-10-CM

## 2019-04-27 DIAGNOSIS — I25119 Atherosclerotic heart disease of native coronary artery with unspecified angina pectoris: Secondary | ICD-10-CM

## 2019-04-27 DIAGNOSIS — E119 Type 2 diabetes mellitus without complications: Secondary | ICD-10-CM

## 2019-04-27 DIAGNOSIS — I1310 Hypertensive heart and chronic kidney disease without heart failure, with stage 1 through stage 4 chronic kidney disease, or unspecified chronic kidney disease: Secondary | ICD-10-CM

## 2019-04-27 NOTE — Progress Notes (Deleted)
Office Visit    Patient Name: Lori Rivera Date of Encounter: 04/27/2019  Primary Care Provider:  Garwin Brothers, MD Primary Cardiologist:  Shirlee More, MD Electrophysiologist:  Constance Haw, MD   Chief Complaint    Lori Rivera is a 73 y.o. female with a hx of CAD s/p NSTEMI and multiple PCI, HFrEF (35-40%), ischemic cardiomyopathy, PSVT with  Biotronik ICD, hypertensive heart disease, CKDIV, DM2 presents today for transitions of care follow-up after hospitalization.  Past Medical History    Past Medical History:  Diagnosis Date  . Aortic regurgitation 09/24/2017  . Arthritis 09/24/2017  . CAD in native artery 03/07/2015   Overview:  1. Out of hospital cardiac arrest Aug 2016 with MI, shock, PCI and 2 Xience DES to mid and distal culprit RCA with diffuse LAD disease and hypothermia protocol 2. NonSTEMI,05/27/15  Conclusions: 1. Pt was turned down by CTS due to co-morbidities. Pt is for PCI to multiple lesions on LAD and RCA. Ramus is small, will treat medically. 2. Successful PCI / Xience Drug Eluting Stent of the m  . Chest pain 08/08/2015  . CHF (congestive heart failure) ([place]) 09/24/2017  . Chronic combined systolic and diastolic heart failure ([place]) 08/19/2015   Overview:  Echo 08/03/15: Left Ventricle Normal LV chamber size. Normal LV wall thickness. Mildly reduced overall LV systolic function. Estimated LVEF 45% Inferior, posterior and inferoseptal hypokinesis. Abnormal LV diastolic function, Grade 2, with echo evidence of elevated LA pressure.  Overview:  MUGA 20% 09/03/16 Overview:  Echo 08/03/15: Left Ventricle Normal LV chamber size. Normal LV wall   . Chronic systolic heart failure ([place]) 06/07/2015  . CKD (chronic kidney disease) stage 3, GFR 30-59 ml/min (HCC) 06/07/2015  . Coronary artery disease involving native heart with angina pectoris ([place]) 06/29/2017   Overview:  Added automatically from request for surgery [phone]  . 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 Essential hypertension 06/29/2017  . Hyperlipidemia 03/07/2015  . 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 ([place]) 03/07/2015   Overview:  EF 35% 05/25/15  . Ischemic cardiomyopathy 11/18/2015   Overview:  EF 20-25%  . NSTEMI (non-ST elevated myocardial infarction) ([place]) 05/25/2015   Overview:  Cardiac cath 05/28/15 :Conclusions 1. Pt was turned down by CTS due to co-morbidities. Pt is for PCI to multiple lesions on LAD and RCA. Ramus is small, will treat medically. 2. Successful PCI / Xience Drug Eluting Stent of the mid and distal Left Anterior Descending Coronary Artery. 3. Successful PCI / Xience Drug Eluting Stent of the ostial and mid Right Coronary Artery. 4. POBA to Di  . PNA (pneumonia) 08/08/2015  . Pneumonia due to infectious organism 08/19/2015   Overview:  Discharge summary 08/11/15: Her chest x-ray showed airspace opacities concerning for multifocal pneumonia and she will be discharged on oral Levaquin. Follow-up chest x-ray is recommended to ensure resolution.  . Shortness of breath 09/24/2017  . ST elevation myocardial infarction involving right coronary artery ([place]) 08/03/2015   Overview:  Due to ISR and acute stent thrombosis Cath 08/03/15: Diagnostic procedure: Left Heart Cath PCI procedure: Stent DES, Thrombectomy Complications: No Complications. Conclusions Diagnostic Summary Acute occlusion of RCA ostial stent thrombosis Severe stenosis of the mid RCA, partially ISR Patent LAD stent CTO of small circ system Interventional Summary Successful Thrombectomy/PTCA of the o  . STEMI (ST elevation myocardial infarction) ([place]) 08/03/2015  . Type 2 diabetes mellitus, without long-term current use of insulin ([place]) 03/07/2015  . Vitamin  B 12 deficiency 09/24/2017  . Vitamin D deficiency 09/24/2017   Past Surgical History:  Procedure Laterality Date  . C-spine surgery    . CATARACT EXTRACTION, BILATERAL    .  CHOLECYSTECTOMY    . CORONARY ANGIOPLASTY WITH STENT PLACEMENT    . CORONARY ATHERECTOMY N/A 04/08/2019   Procedure: CORONARY ATHERECTOMY;  Surgeon: Sherren Mocha, MD;  Location: [facility];  Service: Cardiovascular;  Laterality: N/A;  . CORONARY STENT INTERVENTION W/IMPELLA N/A 04/08/2019   Procedure: Coronary Stent Intervention w/Impella;  Surgeon: Sherren Mocha, MD;  Location: [facility];  Service: Cardiovascular;  Laterality: N/A;  . INSERT / REPLACE / REMOVE PACEMAKER     ICD, Medtronic  . LEFT HEART CATH AND CORONARY ANGIOGRAPHY N/A 03/31/2019   Procedure: LEFT HEART CATH AND CORONARY ANGIOGRAPHY;  Surgeon: Belva Crome, MD;  Location: [facility];  Service: Cardiovascular;  Laterality: N/A;  . LITHOTRIPSY    . URETEROSCOPY      Allergies  Allergies  Allergen Reactions  . Codeine Other (See Comments)    confused  . Penicillins Rash    Did it involve swelling of the face/tongue/throat, SOB, or low BP? Yes Did it involve sudden or severe rash/hives, skin peeling, or any reaction on the inside of your mouth or nose? No Did you need to seek medical attention at a hospital or doctor's office? Yes When did it last happen?20 yrs ago If all above answers are "NO", may proceed with cephalosporin use.     History of Present Illness    Lori Rivera is a 73 y.o. female with a hx of CAD s/p NSTEMI and multiple PCI, HFrEF (35-40%), ischemic cardiomyopathy, PSVT with  Biotronik ICD, hypertensive heart disease, CKDIV, DM2 last seen while hospitalized.   She was admitted to [facility] 03/31/19-04/05/19 for NSTEMI. Cardiac catheterization 03/31/19 - she was recommended for medical therapy due to poor functional capacity. Presented to [facility] ED 04/06/2019 with chest pain and dyspnea.  Diagnosis/pulmonary edema in the setting of unstable angina and required intubation and transfer to [facility].  She had noted EKG changes with lateral ST depression.  She was  readmitted to [facility] 04/07/2019 and subsequently discharged 04/14/19 with home health PT/OT.  She underwent successful atherectomy and stenting of the proximal LAD and left main on 04/08/2019.  Noted still occluded RCA and disease in her diagonals.  Recommended for aspirin and Brilinta for at least 1 year-likely DAPT indefinitely.  EKGs/Labs/Other Studies Reviewed:   The following studies were reviewed today:  Cardiac Artherectomy and Cardiac Cath 04/08/19  Conclusion  Successful atherectomy and stenting of the proximal LAD and left mainstem with Impella CP hemodynamic support  Recommendations  Antiplatelet/Anticoag Recommend dual antiplatelet therapy with Aspirin 81mg  daily and Ticagrelor 90mg  twice daily long-term (beyond 12 months) because of left main stenting/diabetes.   Diagnostic Dominance: Right  Intervention   Echo 04/01/19 1. The left ventricle has moderately reduced systolic function, with an ejection fraction of 35-40%. The cavity size was normal. Left ventricular diastolic Doppler parameters are consistent with impaired relaxation.  2. The right ventricle has normal systolic function. The cavity was normal. There is no increase in right ventricular wall thickness.  3. The mitral valve is abnormal. Moderate thickening of the mitral valve leaflet. Moderate calcification of the mitral valve leaflet.  4. The tricuspid valve is grossly normal.  5. The aortic valve is abnormal. Moderate thickening of the aortic valve. Moderate calcification of the aortic valve.  Aortic valve regurgitation is mild by color flow Doppler. Mild stenosis of the aortic valve.  6. The aorta is normal unless otherwise noted.  7. The aortic root and ascending aorta are normal in size and structure.  8. The atrial septum is grossly normal.  Cardiac cath 03/31/19  Severe three-vessel coronary disease including borderline significant left main disease.  The right coronary is heavily stented from proximal to  distal and is totally occluded proximally.  The right coronary fills by left-to-right collaterals.  The left main dampens with engagement using a 5 French catheter.  There is 40 to 50% ostial to mid left main narrowing.  The LAD is heavily calcified and stented from proximal to distal.  At least 3 stents are noted.  There is diffuse in-stent restenosis in the proximal LAD with up to 50% narrowing.  Just distal to the stent there is eccentric 85 to 90% stenosis.  An adjacent diagonal contains proximal diffuse 80 to 90% stenosis forming a [place] 011 bifurcation stenosis.  There is distal LAD disease beyond the third stent.  The circumflex is relatively small.  The first obtuse marginal/ramus contains segmental 95% stenosis from ostial to proximal and apparently has previously undergone attempted PCI and was labeled "non-dilatable".  The circumflex then becomes very small after the first marginal giving to small to moderate sized obtuse marginal branches with a 70% stenosis proximal to the third obtuse marginal.  Left ventricular systolic dysfunction with EF 35 to 40% with regional wall motion abnormality involving the anterior wall and inferior wall.  LVEDP is 21 mmHg consistent with chronic combined systolic and diastolic heart failure.   RECOMMENDATIONS:    Heart team approach: Need to determine surgical versus interventional versus medical therapy.  After completing the procedure and speaking to family members, apparently the patient has had altered cognitive function/interaction since her myocardial infarction.  She suffered cardiac arrest and had 12 minutes of CPR according to the daughter.  After discussion, perhaps PCI will be her revascularization approach but will be high risk.  At this point it appears that treating the LAD diagonal or simply the LAD is technically possible although with heavy calcification and prior history of non-dilatable lesion, perhaps an atheroablative technique should  be considered  EKG:  EKG is *** ordered today.  The ekg ordered today demonstrates ***  Recent Labs: 01/21/2019: TSH 2.030 04/03/2019: B Natriuretic Peptide 332.0 04/07/2019: ALT 15 04/13/2019: Magnesium 1.9 04/14/2019: BUN 53; Creatinine, Ser 1.82; Hemoglobin 9.1; Platelets 227; Potassium 4.2; Sodium 138  Recent Lipid Panel    Component Value Date/Time   CHOL 125 03/31/2019 0750   TRIG 64 04/08/2019 0222   HDL 42 03/31/2019 0750   CHOLHDL 3.0 03/31/2019 0750   VLDL 13 03/31/2019 0750   LDLCALC 70 03/31/2019 0750    Home Medications   No outpatient medications have been marked as taking for the 04/27/19 encounter (Office Visit) with Loel Dubonnet, NP.      Review of Systems    ROS All other systems reviewed and are otherwise negative except as noted above.  Physical Exam    VS:  There were no vitals taken for this visit. , BMI There is no height or weight on file to calculate BMI. GEN: Well nourished, well developed, in no acute distress. HEENT: normal. Neck: Supple, no JVD, carotid bruits, or masses. Cardiac: ***RRR, no murmurs, rubs, or gallops. No clubbing, cyanosis, edema.  ***Radials/DP/PT 2+ and equal bilaterally.  Respiratory:  ***Respirations regular and unlabored,  clear to auscultation bilaterally. GI: Soft, nontender, nondistended, BS + x 4. MS: No deformity or atrophy. Skin: Warm and dry, no rash. Neuro:  Strength and sensation are intact. Psych: Normal affect.  Accessory Clinical Findings    ECG personally reviewed by me today - *** - no acute changes.  Assessment & Plan    1. CAD - NSTEMI 03/31/19 - cath with severe multivessel disease determined to not be a candidate for CABG recommended for medical therapy. Returned to hospital day after discharge with recurrent anginal symptoms leading to flash pulmonary edema and requiring coronary atherectomy and DES LAD and LM on 04/08/19. Known residual disease to RCA and diagonal.  2. Ischemic cardiomyopathy - Echo  04/01/19 with EF 35-40%, impaired LV relaxation, mild AS. Plan, as above.  3. Hypertensive heart and kidney disease with combined systolic and diastolic heart failure -   HTN -   CKDIV -   CHF -  4. HLD - Lipid profil 03/31/19 with LDL 70. Continue Atorvastatin.  5. DM2 - Follows with her PCP. HbA1c on 03/31/19 was 8.6.  6. Biotronik ICD - Follows with Dr. Curt Bears. Remote device check 03/16/19 with abnormal device interrogation due to NSVT noted. Her metoprolol was adjusted.   Disposition: Follow up {follow up:[id number]} with ***   Loel Dubonnet, NP 04/27/2019, 8:14 AM

## 2019-04-28 ENCOUNTER — Other Ambulatory Visit: Payer: Self-pay

## 2019-04-28 NOTE — Progress Notes (Signed)
Office Visit    Patient Name: Lori Rivera Date of Encounter: 04/29/2019  Primary Care Provider:  Garwin Brothers, MD Primary Cardiologist:  Shirlee More, MD Electrophysiologist:  Constance Haw, MD   Chief Complaint    Lori Rivera is a 73 y.o. female with a hx of CAD s/p NSTEMI and multiple PCI, HFrEF (35-40%), ischemic cardiomyopathy, PSVT with  Biotronik ICD, hypertensive heart disease, CKDIV, DM2 presents today for transitions of care follow-up after hospitalization.  Past Medical History    Past Medical History:  Diagnosis Date  . Aortic regurgitation 09/24/2017  . Arthritis 09/24/2017  . CAD in native artery 03/07/2015   Overview:  1. Out of hospital cardiac arrest Aug 2016 with MI, shock, PCI and 2 Xience DES to mid and distal culprit RCA with diffuse LAD disease and hypothermia protocol 2. NonSTEMI,05/27/15  Conclusions: 1. Pt was turned down by CTS due to co-morbidities. Pt is for PCI to multiple lesions on LAD and RCA. Ramus is small, will treat medically. 2. Successful PCI / Xience Drug Eluting Stent of the m  . Chest pain 08/08/2015  . CHF (congestive heart failure) ([place]) 09/24/2017  . Chronic combined systolic and diastolic heart failure ([place]) 08/19/2015   Overview:  Echo 08/03/15: Left Ventricle Normal LV chamber size. Normal LV wall thickness. Mildly reduced overall LV systolic function. Estimated LVEF 45% Inferior, posterior and inferoseptal hypokinesis. Abnormal LV diastolic function, Grade 2, with echo evidence of elevated LA pressure.  Overview:  MUGA 20% 09/03/16 Overview:  Echo 08/03/15: Left Ventricle Normal LV chamber size. Normal LV wall   . Chronic systolic heart failure ([place]) 06/07/2015  . CKD (chronic kidney disease) stage 3, GFR 30-59 ml/min (HCC) 06/07/2015  . Coronary artery disease involving native heart with angina pectoris ([place]) 06/29/2017   Overview:  Added automatically from request for surgery [phone]  . 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 Essential hypertension 06/29/2017  . Hyperlipidemia 03/07/2015  . 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 ([place]) 03/07/2015   Overview:  EF 35% 05/25/15  . Ischemic cardiomyopathy 11/18/2015   Overview:  EF 20-25%  . NSTEMI (non-ST elevated myocardial infarction) ([place]) 05/25/2015   Overview:  Cardiac cath 05/28/15 :Conclusions 1. Pt was turned down by CTS due to co-morbidities. Pt is for PCI to multiple lesions on LAD and RCA. Ramus is small, will treat medically. 2. Successful PCI / Xience Drug Eluting Stent of the mid and distal Left Anterior Descending Coronary Artery. 3. Successful PCI / Xience Drug Eluting Stent of the ostial and mid Right Coronary Artery. 4. POBA to Di  . PNA (pneumonia) 08/08/2015  . Pneumonia due to infectious organism 08/19/2015   Overview:  Discharge summary 08/11/15: Her chest x-ray showed airspace opacities concerning for multifocal pneumonia and she will be discharged on oral Levaquin. Follow-up chest x-ray is recommended to ensure resolution.  . Shortness of breath 09/24/2017  . ST elevation myocardial infarction involving right coronary artery ([place]) 08/03/2015   Overview:  Due to ISR and acute stent thrombosis Cath 08/03/15: Diagnostic procedure: Left Heart Cath PCI procedure: Stent DES, Thrombectomy Complications: No Complications. Conclusions Diagnostic Summary Acute occlusion of RCA ostial stent thrombosis Severe stenosis of the mid RCA, partially ISR Patent LAD stent CTO of small circ system Interventional Summary Successful Thrombectomy/PTCA of the o  . STEMI (ST elevation myocardial infarction) ([place]) 08/03/2015  . Type 2 diabetes mellitus, without long-term current use of insulin ([place]) 03/07/2015  . Vitamin  B 12 deficiency 09/24/2017  . Vitamin D deficiency 09/24/2017   Past Surgical History:  Procedure Laterality Date  . C-spine surgery    . CATARACT EXTRACTION, BILATERAL    .  CHOLECYSTECTOMY    . CORONARY ANGIOPLASTY WITH STENT PLACEMENT    . CORONARY ATHERECTOMY N/A 04/08/2019   Procedure: CORONARY ATHERECTOMY;  Surgeon: Sherren Mocha, MD;  Location: [place] [facility];  Service: Cardiovascular;  Laterality: N/A;  . CORONARY STENT INTERVENTION W/IMPELLA N/A 04/08/2019   Procedure: Coronary Stent Intervention w/Impella;  Surgeon: Sherren Mocha, MD;  Location: [place] [facility];  Service: Cardiovascular;  Laterality: N/A;  . INSERT / REPLACE / REMOVE PACEMAKER     ICD, Medtronic  . LEFT HEART CATH AND CORONARY ANGIOGRAPHY N/A 03/31/2019   Procedure: LEFT HEART CATH AND CORONARY ANGIOGRAPHY;  Surgeon: Belva Crome, MD;  Location: [place] [facility];  Service: Cardiovascular;  Laterality: N/A;  . LITHOTRIPSY    . URETEROSCOPY      Allergies  Allergies  Allergen Reactions  . Codeine Other (See Comments)    confused  . Penicillins Rash    Did it involve swelling of the face/tongue/throat, SOB, or low BP? Yes Did it involve sudden or severe rash/hives, skin peeling, or any reaction on the inside of your mouth or nose? No Did you need to seek medical attention at a hospital or doctor's office? Yes When did it last happen?20 yrs ago If all above answers are "NO", may proceed with cephalosporin use.     History of Present Illness    Lori Rivera is a 73 y.o. female with a hx of CAD s/p NSTEMI and multiple PCI, HFrEF (35-40%), ischemic cardiomyopathy, PSVT with  Biotronik ICD, hypertensive heart disease, CKDIV, DM2 last seen while hospitalized.   She was admitted to [facility] 03/31/19-04/05/19 for NSTEMI. Cardiac catheterization 03/31/19 - she was recommended for medical therapy due to poor functional capacity. Presented to [facility] ED 04/06/2019 with chest pain and dyspnea.  Diagnosis/pulmonary edema in the setting of unstable angina and required intubation and transfer to [facility].  She had noted EKG changes with lateral ST depression.   She was  readmitted to [facility] 04/07/2019 and subsequently discharged 04/14/19 with home health PT/OT.  She underwent successful atherectomy and stenting of the proximal LAD and left main on 04/08/2019.  Noted still occluded RCA and disease in her diagonals.  Recommended for aspirin and Brilinta for at least 1 year-likely DAPT indefinitely.  She reports feeling fair since discharge. Her daughter is present with her at time of visit and assists with her care. They have been doing daily weights and on their scale were 206lb at time of discharge and now down to 196lb.   Reports no chest pain since discharge. Her LE edema has improved dramatically and she sits with her feet up - does not eat salt. She reports continued SOB at rest and with activity. Tells me this is made better by a fan in front of her and cool air. She nor her daughter can identify any temporal factors nor aggravating factors. Reports continued fatigue, but tells me things seem to be improving day by day.   She denies palpitations, irregular heart beats, ICD fire.   EKGs/Labs/Other Studies Reviewed:   The following studies were reviewed today:  Cardiac Artherectomy and Cardiac Cath 04/08/19  Conclusion  Successful atherectomy and stenting of the proximal LAD and left mainstem with Impella CP hemodynamic support  Recommendations  Antiplatelet/Anticoag Recommend  dual antiplatelet therapy with Aspirin 81mg  daily and Ticagrelor 90mg  twice daily long-term (beyond 12 months) because of left main stenting/diabetes.   Diagnostic Dominance: Right  Intervention   Echo 04/01/19 1. The left ventricle has moderately reduced systolic function, with an ejection fraction of 35-40%. The cavity size was normal. Left ventricular diastolic Doppler parameters are consistent with impaired relaxation.  2. The right ventricle has normal systolic function. The cavity was normal. There is no increase in right ventricular wall thickness.  3. The mitral valve is  abnormal. Moderate thickening of the mitral valve leaflet. Moderate calcification of the mitral valve leaflet.  4. The tricuspid valve is grossly normal.  5. The aortic valve is abnormal. Moderate thickening of the aortic valve. Moderate calcification of the aortic valve. Aortic valve regurgitation is mild by color flow Doppler. Mild stenosis of the aortic valve.  6. The aorta is normal unless otherwise noted.  7. The aortic root and ascending aorta are normal in size and structure.  8. The atrial septum is grossly normal.  Cardiac cath 03/31/19  Severe three-vessel coronary disease including borderline significant left main disease.  The right coronary is heavily stented from proximal to distal and is totally occluded proximally.  The right coronary fills by left-to-right collaterals.  The left main dampens with engagement using a 5 French catheter.  There is 40 to 50% ostial to mid left main narrowing.  The LAD is heavily calcified and stented from proximal to distal.  At least 3 stents are noted.  There is diffuse in-stent restenosis in the proximal LAD with up to 50% narrowing.  Just distal to the stent there is eccentric 85 to 90% stenosis.  An adjacent diagonal contains proximal diffuse 80 to 90% stenosis forming a [place] 011 bifurcation stenosis.  There is distal LAD disease beyond the third stent.  The circumflex is relatively small.  The first obtuse marginal/ramus contains segmental 95% stenosis from ostial to proximal and apparently has previously undergone attempted PCI and was labeled "non-dilatable".  The circumflex then becomes very small after the first marginal giving to small to moderate sized obtuse marginal branches with a 70% stenosis proximal to the third obtuse marginal.  Left ventricular systolic dysfunction with EF 35 to 40% with regional wall motion abnormality involving the anterior wall and inferior wall.  LVEDP is 21 mmHg consistent with chronic combined systolic and  diastolic heart failure.   RECOMMENDATIONS:    Heart team approach: Need to determine surgical versus interventional versus medical therapy.  After completing the procedure and speaking to family members, apparently the patient has had altered cognitive function/interaction since her myocardial infarction.  She suffered cardiac arrest and had 12 minutes of CPR according to the daughter.  After discussion, perhaps PCI will be her revascularization approach but will be high risk.  At this point it appears that treating the LAD diagonal or simply the LAD is technically possible although with heavy calcification and prior history of non-dilatable lesion, perhaps an atheroablative technique should be considered  EKG:  EKG is ordered today.  The ekg ordered today demonstrates SR with 1st degree AV block rate 67 and PR 250 with stable t-wave changes in leads I, II, III compared to tracing 04/08/19 and 04/11/19 with poor R wave progression. - no acute changes - reviewed with Dr. Agustin Cree.  Recent Labs: 01/21/2019: TSH 2.030 04/03/2019: B Natriuretic Peptide 332.0 04/07/2019: ALT 15 04/13/2019: Magnesium 1.9 04/14/2019: BUN 53; Creatinine, Ser 1.82; Hemoglobin 9.1; Platelets 227; Potassium 4.2;  Sodium 138  Recent Lipid Panel    Component Value Date/Time   CHOL 125 03/31/2019 0750   TRIG 64 04/08/2019 0222   HDL 42 03/31/2019 0750   CHOLHDL 3.0 03/31/2019 0750   VLDL 13 03/31/2019 0750   LDLCALC 70 03/31/2019 0750    Home Medications   Current Meds  Medication Sig  . aspirin EC 81 MG tablet Take 81 mg by mouth daily.   Marland Kitchen atorvastatin (LIPITOR) 40 MG tablet Take 1 tablet (40 mg total) by mouth daily at 6 PM.  . furosemide (LASIX) 40 MG tablet Take 40 mg by mouth 2 (two) times daily.  . isosorbide mononitrate (IMDUR) 60 MG 24 hr tablet Take 1 tablet (60 mg total) by mouth daily.  . Metoprolol Tartrate 75 MG TABS Take 75 mg by mouth 2 (two) times daily.  . Multiple Vitamin (MULTIVITAMIN WITH  MINERALS) TABS tablet Take 1 tablet by mouth daily.  . nitroGLYCERIN (NITROSTAT) 0.4 MG SL tablet Place 0.4 mg under the tongue every 5 (five) minutes as needed.   . ticagrelor (BRILINTA) 90 MG TABS tablet Take 1 tablet (90 mg total) by mouth 2 (two) times daily.  . TRESIBA FLEXTOUCH 100 UNIT/ML SOPN FlexTouch Pen Inject 10 Units into the skin daily.       Review of Systems    Review of Systems  Constitution: Positive for malaise/fatigue. Negative for chills and fever.  Cardiovascular: Positive for dyspnea on exertion and leg swelling. Negative for chest pain, irregular heartbeat, near-syncope, palpitations and paroxysmal nocturnal dyspnea.  Respiratory: Positive for shortness of breath. Negative for cough and wheezing.   Gastrointestinal: Negative for nausea and vomiting.  Neurological: Positive for dizziness ("sometimes") and light-headedness ("sometimes"). Negative for weakness.   All other systems reviewed and are otherwise negative except as noted above.  Physical Exam    VS:  BP 110/70   Pulse 67   Ht 5\' 9"  (1.753 m)   Wt 196 lb (88.9 kg)   SpO2 99%   BMI 28.94 kg/m  , BMI Body mass index is 28.94 kg/m. GEN: Well nourished, well developed, in no acute distress. HEENT: normal. Neck: Supple, no JVD, carotid bruits, or masses. Cardiac: RRR, soft 1/6 murmur at apex. No rubs, or gallops. No clubbing, cyanosis. Trace lower extremity edema.  Radials/DP/PT 2+ and equal bilaterally.  Respiratory:  Respirations regular and unlabored, clear to auscultation bilaterally. She does get mildly dyspneic with long sentences.  GI: Soft, nontender, nondistended, BS + x 4. MS: No deformity or atrophy. Skin: Warm and dry, no rash. Neuro:  Strength and sensation are intact. Psych: Normal affect.  Accessory Clinical Findings    ECG personally reviewed by me today - SR with 1st degree AV block rate 67 and PR 250 with stable t-wave changes in leads I, II, III compared to tracing 04/08/19 and  04/11/19 with poor R wave progression. - no acute changes - reviewed with Dr. Agustin Cree.  Assessment & Plan    1. CAD - NSTEMI 03/31/19 - cath with severe multivessel disease determined to not be a candidate for CABG recommended for medical therapy. Returned to hospital day after discharge with recurrent anginal symptoms leading to flash pulmonary edema and requiring coronary atherectomy and DES LAD and LM on 04/08/19. Known residual disease to RCA and diagonal. GDMT aspirin, beta blocker, Brilinta, statin, Imdur.   2. Ischemic cardiomyopathy - Echo 04/01/19 with EF 35-40%, impaired LV relaxation, mild AS. Plan, as above.   3. Hypertensive heart and kidney disease  with combined systolic and diastolic heart failure -   HTN - BP stable today 110/70. Not checking routinely at home. Reports intermittent lightheadedness. Would not increase anti-hypertensive regimen at this time.   CKDIV - 04/14/19 creatinine 1.82, GFR 27. BMP today.  CHF - Euvolemic on exam with only trace LE edema. NYHA class II with SOB. Continue present diuretic Lasix 40mg  BID. Recommended elevating lower extremities, continue low sodium diet. GDMT beta blocker. No ACE/ARB in setting of CKDIV and low normal BP. ProBNP today.   4. SOB - Reports SOB at rest and with activity. Was present prior to hospitalization. Relieved by fan and cool air. Slowly improving since discharge. Etiology CAD vs CHF vs deconditioning vs anemia. Low suspicion this is due to Brilinta as she has been on it for 1 month and symptoms were present prior to initiation. CBC, ProBNP today.   Ultimately, would like to start Entresto 24/26mg  (EF 35-40% on 04/01/19) but in setting of CKD IV and low normal BP will hold consider at follow up.   Cannot increase Imdur at this time due to low normal BP with some lightheadedness.   5. HLD - Lipid profile 03/31/19 with LDL 70. Continue Atorvastatin.   6. DM2 - Follows with her PCP. HbA1c on 03/31/19 was 8.6. In setting of CAD  would consider SGLT2i such as dapagliflozin if additional agent needed. She has follow up with her PCP in two weeks.   7. Biotronik ICD - Follows with Dr. Curt Bears. Remote device check 03/16/19 with abnormal device interrogation due to NSVT noted. Her metoprolol was adjusted.   Disposition: Follow up in 6 week(s) with Dr. Earney Navy, NP 04/29/2019, 3:06 PM

## 2019-04-29 ENCOUNTER — Ambulatory Visit (INDEPENDENT_AMBULATORY_CARE_PROVIDER_SITE_OTHER): Payer: Medicare Other | Admitting: Family

## 2019-04-29 ENCOUNTER — Encounter: Payer: Self-pay | Admitting: Family

## 2019-04-29 VITALS — BP 110/70 | HR 67 | Ht 69.0 in | Wt 196.0 lb

## 2019-04-29 DIAGNOSIS — I25119 Atherosclerotic heart disease of native coronary artery with unspecified angina pectoris: Secondary | ICD-10-CM | POA: Diagnosis not present

## 2019-04-29 DIAGNOSIS — N183 Chronic kidney disease, stage 3 unspecified: Secondary | ICD-10-CM

## 2019-04-29 DIAGNOSIS — I5042 Chronic combined systolic (congestive) and diastolic (congestive) heart failure: Secondary | ICD-10-CM

## 2019-04-29 DIAGNOSIS — I1310 Hypertensive heart and chronic kidney disease without heart failure, with stage 1 through stage 4 chronic kidney disease, or unspecified chronic kidney disease: Secondary | ICD-10-CM

## 2019-04-29 NOTE — Patient Outreach (Signed)
[facility]) Care Management  04/29/2019  EMMERY SEILER 06/13/1946 [id number]  Transition of care Referral date: 04/15/19 Referral source: Hospital liaison Referral reason:  Transition of care complex case management Insurance: medicare  Outreach attempt #1  Telephone call to patient regarding transition of care follow up. Unable to reach. HIPAA compliant voice message left with call back phone number  PLAN; RNCM will attempt 2nd telephone call to patient within 4 business days.   Quinn Plowman RN,BSN,CCM [facility] Telephonic  [phone]

## 2019-04-29 NOTE — Patient Instructions (Addendum)
Medication Instructions:   Continue your current medications.   If you need a refill on your cardiac medications before your next appointment, please call your pharmacy.   Lab work: Your physician recommends that you return for lab work today: CBC, BMET, ProBNP  This will check your fluid volume, blood counts, kidneys, and electrolytes.   If you have labs (blood work) drawn today and your tests are completely normal, you will receive your results only by: Marland Kitchen MyChart Message (if you have MyChart) OR . A paper copy in the mail If you have any lab test that is abnormal or we need to change your treatment, we will call you to review the results.  Testing/Procedures: You had an EKG today.  Follow-Up: At [facility], you and your health needs are our priority.  As part of our continuing mission to provide you with exceptional heart care, we have created designated Provider Care Teams.  These Care Teams include your primary Cardiologist (physician) and Advanced Practice Providers (APPs -  Physician Assistants and Nurse Practitioners) who all work together to provide you with the care you need, when you need it. You will need a follow up appointment in 1 months. You may see Shirlee More, MD or another member of our [place] Provider Team in [place]: Jenne Campus, MD . Jyl Heinz, MD  Any Other Special Instructions Will Be Listed Below (If Applicable).   Continue to weigh daily and keep a log. If you gain 3lbs overnight or 5lbs in 1 week, please call our office.    Continue a low salt diet.    Check blood pressure daily. If systolic blood pressure (the top number) is consistently >130 or <110, please call our office.    Keep a log of when you notice your shortness of breath. Is it at rest or with activity? What time of day? What were you doing (sitting, reading, watching TV, etc.)   Your hemoglobin was 9.1 in the hospital. Your Hb is typically 9-10. Would recommend  dietary sources of iron. This includes leafy green vegetables, whole grain.

## 2019-04-30 LAB — BASIC METABOLIC PANEL
BUN/Creatinine Ratio: 30 — ABNORMAL HIGH (ref 12–28)
BUN: 59 mg/dL — ABNORMAL HIGH (ref 8–27)
CO2: 26 mmol/L (ref 20–29)
Calcium: 9.8 mg/dL (ref 8.7–10.3)
Chloride: 94 mmol/L — ABNORMAL LOW (ref 96–106)
Creatinine, Ser: 2 mg/dL — ABNORMAL HIGH (ref 0.57–1.00)
GFR calc Af Amer: 28 mL/min/{1.73_m2} — ABNORMAL LOW (ref >59)
GFR calc non Af Amer: 24 mL/min/{1.73_m2} — ABNORMAL LOW (ref >59)
Glucose: 233 mg/dL — ABNORMAL HIGH (ref 65–99)
Potassium: 3.5 mmol/L (ref 3.5–5.2)
Sodium: 140 mmol/L (ref 134–144)

## 2019-04-30 LAB — CBC
Hematocrit: 39.2 % (ref 34.0–46.6)
Hemoglobin: 13.1 g/dL (ref 11.1–15.9)
MCH: 29.3 pg (ref 26.6–33.0)
MCHC: 33.4 g/dL (ref 31.5–35.7)
MCV: 88 fL (ref 79–97)
Platelets: 337 10*3/uL (ref 150–450)
RBC: 4.47 x10E6/uL (ref 3.77–5.28)
RDW: 13.2 % (ref 11.7–15.4)
WBC: 10.1 10*3/uL (ref 3.4–10.8)

## 2019-04-30 LAB — PRO B NATRIURETIC PEPTIDE: NT-Pro BNP: 2046 pg/mL — ABNORMAL HIGH (ref 0–738)

## 2019-05-02 ENCOUNTER — Telehealth: Payer: Self-pay | Admitting: Family

## 2019-05-02 DIAGNOSIS — I1310 Hypertensive heart and chronic kidney disease without heart failure, with stage 1 through stage 4 chronic kidney disease, or unspecified chronic kidney disease: Secondary | ICD-10-CM

## 2019-05-02 NOTE — Telephone Encounter (Signed)
Spoke with patient regarding lab results.   Hb much improved, anemia improved.  Kidney function with slight decline from previous - will decrease Lasix to 40mg  in the morning and 20mg  in the evening.   ProBNP elevated. Not surprising as she still having SOB and trace edema. Careful titration of diuretic in setting of CKD.   She will have repeat BMP in 2 weeks.   Loel Dubonnet, NP

## 2019-05-03 ENCOUNTER — Other Ambulatory Visit: Payer: Self-pay

## 2019-05-03 NOTE — Patient Outreach (Signed)
[facility] [facility]) Care Management  05/03/2019  Lori Rivera [birth year] [id number]   Transition of care Referral date:04/15/19 Referral source:Hospital liaison Referral reason:Transition of care complex case management Insurance:medicare  Outreach attempt #2  Telephone call to patient's daughter regarding transition of care follow up. Unable to reach or leave voice message  Phone rang busy  PLAN; RNCM will attempt 3rd telephone call to patient within 4 business days.   Quinn Plowman RN,BSN,CCM [facility] Telephonic  [phone]

## 2019-05-06 NOTE — Progress Notes (Signed)
Error

## 2019-05-11 ENCOUNTER — Ambulatory Visit: Payer: Self-pay

## 2019-05-18 ENCOUNTER — Other Ambulatory Visit: Payer: Self-pay

## 2019-05-18 NOTE — Patient Outreach (Incomplete)
[facility] [facility]) Care Management  05/18/2019  Lori Rivera 11/27/1945 [id number]  Transition of care Referral date:04/15/19 Referral source:Hospital liaison Referral reason:Transition of care complex case management Insurance:medicare

## 2019-05-20 ENCOUNTER — Other Ambulatory Visit: Payer: Self-pay

## 2019-05-20 NOTE — Patient Outreach (Signed)
[facility]) Care Management  05/20/2019  Lori Rivera [birth year] [id number]   Transition of care Referral date:04/15/19 Referral source:Hospital liaison Referral reason:Transition of care complex case management Insurance:medicare  Telephone call to patient's daughter, [name]. Unable to reach. HIPAA compliant voice message left with call back phone number.   PLAN; RNCM will close case due to being unable to reach.  RNCM will send closure letter to patient and primary MD  Quinn Plowman RN,BSN,CCM [facility] Telephonic  [phone]

## 2019-05-20 NOTE — Telephone Encounter (Unsigned)
This encounter was created in error - please disregard.

## 2019-06-03 ENCOUNTER — Ambulatory Visit: Payer: Medicare Other | Admitting: Family

## 2019-06-03 NOTE — Progress Notes (Deleted)
Office Visit    Patient Name: Lori Rivera Date of Encounter: 06/03/2019  Primary Care Provider:  Garwin Brothers, MD Primary Cardiologist:  Shirlee More, MD Electrophysiologist:  Will Meredith Leeds, MD   Chief Complaint    Lori Rivera is a 73 y.o. female with a hx of *** presents today for ***   Past Medical History    Past Medical History:  Diagnosis Date  . Aortic regurgitation 09/24/2017  . Arthritis 09/24/2017  . CAD in native artery 03/07/2015   Overview:  1. Out of hospital cardiac arrest Aug 2016 with MI, shock, PCI and 2 Xience DES to mid and distal culprit RCA with diffuse LAD disease and hypothermia protocol 2. NonSTEMI,05/27/15  Conclusions: 1. Pt was turned down by CTS due to co-morbidities. Pt is for PCI to multiple lesions on LAD and RCA. Ramus is small, will treat medically. 2. Successful PCI / Xience Drug Eluting Stent of the m  . Chest pain 08/08/2015  . CHF (congestive heart failure) ([place]) 09/24/2017  . Chronic combined systolic and diastolic heart failure ([place]) 08/19/2015   Overview:  Echo 08/03/15: Left Ventricle Normal LV chamber size. Normal LV wall thickness. Mildly reduced overall LV systolic function. Estimated LVEF 45% Inferior, posterior and inferoseptal hypokinesis. Abnormal LV diastolic function, Grade 2, with echo evidence of elevated LA pressure.  Overview:  MUGA 20% 09/03/16 Overview:  Echo 08/03/15: Left Ventricle Normal LV chamber size. Normal LV wall   . Chronic systolic heart failure ([place]) 06/07/2015  . CKD (chronic kidney disease) stage 3, GFR 30-59 ml/min (H[facility]) 06/07/2015  . Coronary artery disease involving native heart with angina pectoris ([place]) 06/29/2017   Overview:  Added automatically from request for surgery [phone]  . 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 Essential hypertension 06/29/2017  . Hyperlipidemia 03/07/2015  . 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 Ischemic cardiomyopathy 11/18/2015   Overview:  EF 20-25%  . NSTEMI (non-ST elevated myocardial infarction) ([place]) 05/25/2015   Overview:  Cardiac cath 05/28/15 :Conclusions 1. Pt was turned down by CTS due to co-morbidities. Pt is for PCI to multiple lesions on LAD and RCA. Ramus is small, will treat medically. 2. Successful PCI / Xience Drug Eluting Stent of the mid and distal Left Anterior Descending Coronary Artery. 3. Successful PCI / Xience Drug Eluting Stent of the ostial and mid Right Coronary Artery. 4. POBA to Di  . PNA (pneumonia) 08/08/2015  . Pneumonia due to infectious organism 08/19/2015   Overview:  Discharge summary 08/11/15: Her chest x-ray showed airspace opacities concerning for multifocal pneumonia and she will be discharged on oral Levaquin. Follow-up chest x-ray is recommended to ensure resolution.  . Shortness of breath 09/24/2017  . ST elevation myocardial infarction involving right coronary artery ([place]) 08/03/2015   Overview:  Due to ISR and acute stent thrombosis Cath 08/03/15: Diagnostic procedure: Left Heart Cath PCI procedure: Stent DES, Thrombectomy Complications: No Complications. Conclusions Diagnostic Summary Acute occlusion of RCA ostial stent thrombosis Severe stenosis of the mid RCA, partially ISR Patent LAD stent CTO of small circ system Interventional Summary Successful Thrombectomy/PTCA of the o  . STEMI (ST elevation myocardial infarction) ([place]) 08/03/2015  . Type 2 diabetes mellitus, without long-term current use of insulin ([place]) 03/07/2015  . Vitamin B 12 deficiency 09/24/2017  . Vitamin D deficiency 09/24/2017   Past Surgical History:  Procedure Laterality Date  . C-spine surgery    .  CATARACT EXTRACTION, BILATERAL    . CHOLECYSTECTOMY    . CORONARY ANGIOPLASTY WITH STENT PLACEMENT    . CORONARY ATHERECTOMY N/A 04/08/2019   Procedure: CORONARY ATHERECTOMY;  Surgeon: Sherren Mocha, MD;  Location: [facility];  Service:  Cardiovascular;  Laterality: N/A;  . CORONARY STENT INTERVENTION W/IMPELLA N/A 04/08/2019   Procedure: Coronary Stent Intervention w/Impella;  Surgeon: Sherren Mocha, MD;  Location: [facility];  Service: Cardiovascular;  Laterality: N/A;  . INSERT / REPLACE / REMOVE PACEMAKER     ICD, [employer]onic  . LEFT HEART CATH AND CORONARY ANGIOGRAPHY N/A 03/31/2019   Procedure: LEFT HEART CATH AND CORONARY ANGIOGRAPHY;  Surgeon: Belva Crome, MD;  Location: [facility];  Service: Cardiovascular;  Laterality: N/A;  . LITHOTRIPSY    . URETEROSCOPY      Allergies  Allergies  Allergen Reactions  . Codeine Other (See Comments)    confused  . Penicillins Rash    Did it involve swelling of the face/tongue/throat, SOB, or low BP? Yes Did it involve sudden or severe rash/hives, skin peeling, or any reaction on the inside of your mouth or nose? No Did you need to seek medical attention at a hospital or doctor's office? Yes When did it last happen?20 yrs ago If all above answers are "NO", may proceed with cephalosporin use.     History of Present Illness    Lori Rivera is a 73 y.o. female with a hx of *** last seen ***.  EKGs/Labs/Other Studies Reviewed:   The following studies were reviewed today: ***  EKG:  EKG is *** ordered today.  The ekg ordered today demonstrates ***  Recent Labs: 01/21/2019: TSH 2.030 04/03/2019: B Natriuretic Peptide 332.0 04/07/2019: ALT 15 04/13/2019: Magnesium 1.9 04/29/2019: BUN 59; Creatinine, Ser 2.00; Hemoglobin 13.1; NT-Pro BNP 2,046; Platelets 337; Potassium 3.5; Sodium 140  Recent Lipid Panel    Component Value Date/Time   CHOL 125 03/31/2019 0750   TRIG 64 04/08/2019 0222   HDL 42 03/31/2019 0750   CHOLHDL 3.0 03/31/2019 0750   VLDL 13 03/31/2019 0750   LDLCALC 70 03/31/2019 0750    Home Medications   No outpatient medications have been marked as taking for the 06/03/19 encounter (Appointment) with Loel Dubonnet, NP.       Review of Systems    ***   ROS All other systems reviewed and are otherwise negative except as noted above.  Physical Exam    VS:  There were no vitals taken for this visit. , BMI There is no height or weight on file to calculate BMI. GEN: Well nourished, well developed, in no acute distress. HEENT: normal. Neck: Supple, no JVD, carotid bruits, or masses. Cardiac: ***RRR, no murmurs, rubs, or gallops. No clubbing, cyanosis, edema.  ***Radials/DP/PT 2+ and equal bilaterally.  Respiratory:  ***Respirations regular and unlabored, clear to auscultation bilaterally. GI: Soft, nontender, nondistended, BS + x 4. MS: No deformity or atrophy. Skin: Warm and dry, no rash. Neuro:  Strength and sensation are intact. Psych: Normal affect.  Accessory Clinical Findings    ECG personally reviewed by me today - *** - no acute changes.  Assessment & Plan    1. ***  Disposition: Follow up {follow up:15908} with ***   Loel Dubonnet, NP 06/03/2019, 12:33 PM

## 2019-06-13 ENCOUNTER — Ambulatory Visit (INDEPENDENT_AMBULATORY_CARE_PROVIDER_SITE_OTHER): Payer: Medicare Other | Admitting: *Deleted

## 2019-06-13 DIAGNOSIS — I509 Heart failure, unspecified: Secondary | ICD-10-CM

## 2019-06-13 DIAGNOSIS — I471 Supraventricular tachycardia: Secondary | ICD-10-CM | POA: Diagnosis not present

## 2019-06-13 LAB — CUP PACEART REMOTE DEVICE CHECK
Date Time Interrogation Session: 20201102090718
Implantable Lead Implant Date: 20180412
Implantable Lead Implant Date: 20180412
Implantable Lead Location: 753859
Implantable Lead Location: 753860
Implantable Lead Model: 377
Implantable Lead Model: 402266
Implantable Lead Serial Number: 4970639
Implantable Lead Serial Number: 49730543
Implantable Pulse Generator Implant Date: 20180412
Pulse Gen Model: 404623
Pulse Gen Serial Number: 60967865

## 2019-07-05 NOTE — Progress Notes (Signed)
Remote ICD transmission.   

## 2019-09-12 ENCOUNTER — Ambulatory Visit (INDEPENDENT_AMBULATORY_CARE_PROVIDER_SITE_OTHER): Payer: Medicare Other | Admitting: *Deleted

## 2019-09-12 DIAGNOSIS — I471 Supraventricular tachycardia: Secondary | ICD-10-CM

## 2019-09-12 LAB — CUP PACEART REMOTE DEVICE CHECK
Date Time Interrogation Session: 20210201071529
Implantable Lead Implant Date: 20180412
Implantable Lead Implant Date: 20180412
Implantable Lead Location: 753859
Implantable Lead Location: 753860
Implantable Lead Model: 377
Implantable Lead Model: 402266
Implantable Lead Serial Number: 4970639
Implantable Lead Serial Number: 49730543
Implantable Pulse Generator Implant Date: 20180412
Pulse Gen Model: 404623
Pulse Gen Serial Number: 60967865

## 2019-09-12 NOTE — Progress Notes (Signed)
ICD Remote  

## 2019-10-20 ENCOUNTER — Ambulatory Visit: Payer: Medicare Other | Admitting: Sports Medicine

## 2019-12-12 ENCOUNTER — Ambulatory Visit (INDEPENDENT_AMBULATORY_CARE_PROVIDER_SITE_OTHER): Payer: Medicare Other | Admitting: *Deleted

## 2019-12-12 DIAGNOSIS — I255 Ischemic cardiomyopathy: Secondary | ICD-10-CM

## 2019-12-12 DIAGNOSIS — I509 Heart failure, unspecified: Secondary | ICD-10-CM

## 2019-12-12 LAB — CUP PACEART REMOTE DEVICE CHECK
Date Time Interrogation Session: 20210503133037
Implantable Lead Implant Date: 20180412
Implantable Lead Implant Date: 20180412
Implantable Lead Location: 753859
Implantable Lead Location: 753860
Implantable Lead Model: 377
Implantable Lead Model: 402266
Implantable Lead Serial Number: 4970639
Implantable Lead Serial Number: 49730543
Implantable Pulse Generator Implant Date: 20180412
Pulse Gen Model: 404623
Pulse Gen Serial Number: 60967865

## 2019-12-13 NOTE — Progress Notes (Signed)
Remote ICD transmission.   

## 2019-12-20 ENCOUNTER — Telehealth: Payer: Self-pay | Admitting: *Deleted

## 2019-12-20 MED ORDER — METOPROLOL TARTRATE 100 MG PO TABS: 100.0000 mg | ORAL_TABLET | Freq: Two times a day (BID) | ORAL | 6 refills | Status: DC

## 2019-12-20 NOTE — Telephone Encounter (Signed)
Spoke to dtr, per pt request (she handles her medications). Aware I will have device clinic clarify when episodes occurred, rates, etc. Aware I will call back once I have more detailed information. Dtr agreeable to plan.

## 2019-12-20 NOTE — Telephone Encounter (Signed)
Dtr advised on transmission findings.  Aware that pt had multiple episodes. Dtr agreeable to recommended medication increase. Pt currently takes three 25mg  tablets BID.  Advised to take 4 tabs BID for next several days, before picking up new Rx.   Aware new Rx is for 100 mg tablets. dtr verbalized understanding and agreeable to plan.

## 2019-12-20 NOTE — Telephone Encounter (Signed)
-----   Message from Will Meredith Leeds, MD sent at 12/13/2019  9:13 AM EDT ----- Abnormal device interrogation reviewed.  Lead parameters and battery status stable.  Multiple NSVT episodes. Increase metoprolol to 100 mg BID.

## 2019-12-22 DIAGNOSIS — E119 Type 2 diabetes mellitus without complications: Secondary | ICD-10-CM | POA: Diagnosis not present

## 2019-12-22 DIAGNOSIS — I2510 Atherosclerotic heart disease of native coronary artery without angina pectoris: Secondary | ICD-10-CM | POA: Diagnosis not present

## 2019-12-22 DIAGNOSIS — N939 Abnormal uterine and vaginal bleeding, unspecified: Secondary | ICD-10-CM | POA: Diagnosis not present

## 2019-12-22 DIAGNOSIS — N183 Chronic kidney disease, stage 3 unspecified: Secondary | ICD-10-CM | POA: Diagnosis not present

## 2019-12-23 DIAGNOSIS — I2510 Atherosclerotic heart disease of native coronary artery without angina pectoris: Secondary | ICD-10-CM | POA: Diagnosis not present

## 2019-12-23 DIAGNOSIS — N939 Abnormal uterine and vaginal bleeding, unspecified: Secondary | ICD-10-CM | POA: Diagnosis not present

## 2019-12-23 DIAGNOSIS — N183 Chronic kidney disease, stage 3 unspecified: Secondary | ICD-10-CM | POA: Diagnosis not present

## 2019-12-23 DIAGNOSIS — E119 Type 2 diabetes mellitus without complications: Secondary | ICD-10-CM | POA: Diagnosis not present

## 2020-01-06 ENCOUNTER — Telehealth: Payer: Self-pay

## 2020-01-06 NOTE — Telephone Encounter (Signed)
Biotron[facility] alert received- Treated VF episode. Current meds include- Isosorbide 60mg  daily, Metoprolol 100mg  BID, ASA81 mg, PRN Nitro 0.4mg   Spoke with pt, she does not have any recollection of symptoms occurring yesterday and feels good now.  Pt also could not recall what she was doing at time of episode yesterday.  Pt confirms she is taking medications as ordered.  Advised will need to schedule upcoming appt with MD/ APP to assess.  Pt requested that we contact her daughter Joelene Millin to schedule.  Reminded pt of DMV law regarding no driving for 6 months following treated episode, pt stated she no longer drives.  Reviewed shock plan with pt and advised of ED precautions for chest pain, SOB or dizziness.    Spoke with pt daughter Joelene Millin (DPR on file) advised of event and pt lack of recollection of any symptoms or even what she was doing.

## 2020-01-06 NOTE — Telephone Encounter (Signed)
Agree with clinic follow up.

## 2020-01-20 ENCOUNTER — Encounter: Payer: Self-pay | Admitting: Physician Assistant

## 2020-01-20 ENCOUNTER — Other Ambulatory Visit: Payer: Self-pay

## 2020-01-20 ENCOUNTER — Ambulatory Visit (INDEPENDENT_AMBULATORY_CARE_PROVIDER_SITE_OTHER): Payer: Medicare Other | Admitting: Physician Assistant

## 2020-01-20 VITALS — BP 122/58 | HR 74 | Ht 69.0 in | Wt 188.6 lb

## 2020-01-20 DIAGNOSIS — Z79899 Other long term (current) drug therapy: Secondary | ICD-10-CM | POA: Diagnosis not present

## 2020-01-20 DIAGNOSIS — I4901 Ventricular fibrillation: Secondary | ICD-10-CM | POA: Diagnosis not present

## 2020-01-20 DIAGNOSIS — I5022 Chronic systolic (congestive) heart failure: Secondary | ICD-10-CM

## 2020-01-20 DIAGNOSIS — I255 Ischemic cardiomyopathy: Secondary | ICD-10-CM | POA: Diagnosis not present

## 2020-01-20 DIAGNOSIS — Z95810 Presence of automatic (implantable) cardiac defibrillator: Secondary | ICD-10-CM

## 2020-01-20 DIAGNOSIS — I2510 Atherosclerotic heart disease of native coronary artery without angina pectoris: Secondary | ICD-10-CM | POA: Diagnosis not present

## 2020-01-20 NOTE — Progress Notes (Signed)
Cardiology Office Note Date:  01/20/2020  Patient ID:  Lori, Rivera 1946/02/09, MRN [id number] PCP:  Garwin Brothers, MD  Cardiologist:  Dr. Bettina Gavia EP: Dr. Curt Bears    Chief Complaint: VF  History of Present Illness: Lori Rivera is a 74 y.o. female with history of CAD s/p NSTEMI (h/o multiple PCIs), HFrEF (35-40%), ischemic cardiomyopathy, PSVT with  Biotronik ICD, hypertensive heart disease/HTN, HLD, CKDIV, DM2.  She was admitted to [facility] 03/31/19-04/05/19 for NSTEMI. Cardiac catheterization 03/31/19 - she was recommended for medical therapy due to poor functional capacity. Presented to [facility] ED 04/06/2019 with chest pain and dyspnea.  Diagnosis/pulmonary edema in the setting of unstable angina and required intubation and transfer to [facility].  She had noted EKG changes with lateral ST depression.   She was readmitted to [facility] 04/07/2019 and subsequently discharged 04/14/19 with home health PT/OT.  She underwent successful atherectomy and stenting of the proximal LAD and left main on 04/08/2019.  Noted still occluded RCA and disease in her diagonals.  Recommended for aspirin and Brilinta for at least 1 year-likely DAPT indefinitely.  She comes in today to be seen for Dr. Curt Bears, last seen by him during her last hospital stay (as via weekend cardiology coverage)  More recently she saw C. Gilford Rile, NP Sept 2020, she was s/p recurrent hospitalizations as above, doing pretty well and reported improving daily.  Still with some SOB at rest and DOE, felt better with fan blowing on her. Planned for labs, discussed low normal BP may inhibit ability to start Entresto.  Planned to revisit this at their follow up  She had a treated VF episode 01/05/2020, pt was unaware and without any perceived symptoms.  Comes today to follow up on this.  She is accompanied by her daughter who lives next door and sees her every day.  The patient was surprised to hear she had been shocked, because she was  unaware,  Mentions last year when she was shocked it was terribly painful and very scary. She has not had any CP, palpitations or cardiac awareness. She does not think she fainted, has not had any near syncope. She has some DOE, no rest SOB, no symptoms of PND or orthopnea.  The day of her VF/shock she does not recall any specifi symptoms.  Her days are all largely the same.  She wakes about 9 or so, gets up and gets her day goingm calls her daughter who is usually around by 1000 or so, gets her medicines and breakfast.  She states she would have been up and awake when the shock occurred.  She does ambulate but spend most of hertime seated either in her house or on the porch.  She does very little physical activity at her baseline for the last year or more, especially since [place]   Her daughter mentions that after we called and notified of the event, someone from Dr. Arloa Koh office called and instructed them to increase her lopressor from 69m BID to 1023mBID and she has been doing that for the last week   Device information Biotronik dual chamber ICD, implanted 11/21/2016 + Appropriate tx  01/05/2020, VF May 2020, VT  AAD Hx None to date  Past Medical History:  Diagnosis Date  . Aortic regurgitation 09/24/2017  . Arthritis 09/24/2017  . CAD in native artery 03/07/2015   Overview:  1. Out of hospital cardiac arrest Aug 2016 with MI, shock, PCI and 2 Xience DES to mid  and distal culprit RCA with diffuse LAD disease and hypothermia protocol 2. NonSTEMI,05/27/15  Conclusions: 1. Pt was turned down by CTS due to co-morbidities. Pt is for PCI to multiple lesions on LAD and RCA. Ramus is small, will treat medically. 2. Successful PCI / Xience Drug Eluting Stent of the m  . Chest pain 08/08/2015  . CHF (congestive heart failure) ([place]) 09/24/2017  . Chronic combined systolic and diastolic heart failure ([place]) 08/19/2015   Overview:  Echo 08/03/15: Left Ventricle Normal LV chamber size. Normal LV wall  thickness. Mildly reduced overall LV systolic function. Estimated LVEF 45% Inferior, posterior and inferoseptal hypokinesis. Abnormal LV diastolic function, Grade 2, with echo evidence of elevated LA pressure.  Overview:  MUGA 20% 09/03/16 Overview:  Echo 08/03/15: Left Ventricle Normal LV chamber size. Normal LV wall   . Chronic systolic heart failure ([place]) 06/07/2015  . CKD (chronic kidney disease) stage 3, GFR 30-59 ml/min 06/07/2015  . Coronary artery disease involving native heart with angina pectoris ([place]) 06/29/2017   Overview:  Added automatically from request for surgery [phone]  . 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 Essential hypertension 06/29/2017  . Hyperlipidemia 03/07/2015  . 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 Ischemic cardiomyopathy 11/18/2015   Overview:  EF 20-25%  . NSTEMI (non-ST elevated myocardial infarction) ([place]) 05/25/2015   Overview:  Cardiac cath 05/28/15 :Conclusions 1. Pt was turned down by CTS due to co-morbidities. Pt is for PCI to multiple lesions on LAD and RCA. Ramus is small, will treat medically. 2. Successful PCI / Xience Drug Eluting Stent of the mid and distal Left Anterior Descending Coronary Artery. 3. Successful PCI / Xience Drug Eluting Stent of the ostial and mid Right Coronary Artery. 4. POBA to Di  . PNA (pneumonia) 08/08/2015  . Pneumonia due to infectious organism 08/19/2015   Overview:  Discharge summary 08/11/15: Her chest x-ray showed airspace opacities concerning for multifocal pneumonia and she will be discharged on oral Levaquin. Follow-up chest x-ray is recommended to ensure resolution.  . Shortness of breath 09/24/2017  . ST elevation myocardial infarction involving right coronary artery ([place]) 08/03/2015   Overview:  Due to ISR and acute stent thrombosis Cath 08/03/15: Diagnostic procedure: Left Heart Cath PCI procedure: Stent DES,  Thrombectomy Complications: No Complications. Conclusions Diagnostic Summary Acute occlusion of RCA ostial stent thrombosis Severe stenosis of the mid RCA, partially ISR Patent LAD stent CTO of small circ system Interventional Summary Successful Thrombectomy/PTCA of the o  . STEMI (ST elevation myocardial infarction) ([place]) 08/03/2015  . Type 2 diabetes mellitus, without long-term current use of insulin ([place]) 03/07/2015  . Vitamin B 12 deficiency 09/24/2017  . Vitamin D deficiency 09/24/2017    Past Surgical History:  Procedure Laterality Date  . C-spine surgery    . CATARACT EXTRACTION, BILATERAL    . CHOLECYSTECTOMY    . CORONARY ANGIOPLASTY WITH STENT PLACEMENT    . CORONARY ATHERECTOMY N/A 04/08/2019   Procedure: CORONARY ATHERECTOMY;  Surgeon: Sherren Mocha, MD;  Location: [place] [facility];  Service: Cardiovascular;  Laterality: N/A;  . CORONARY STENT INTERVENTION W/IMPELLA N/A 04/08/2019   Procedure: Coronary Stent Intervention w/Impella;  Surgeon: Sherren Mocha, MD;  Location: [place] [facility];  Service: Cardiovascular;  Laterality: N/A;  . INSERT / REPLACE / REMOVE PACEMAKER     ICD, Medtronic  . LEFT HEART CATH AND CORONARY ANGIOGRAPHY N/A 03/31/2019   Procedure: LEFT HEART  CATH AND CORONARY ANGIOGRAPHY;  Surgeon: Belva Crome, MD;  Location: [place] [facility];  Service: Cardiovascular;  Laterality: N/A;  . LITHOTRIPSY    . URETEROSCOPY      Current Outpatient Medications  Medication Sig Dispense Refill  . aspirin EC 81 MG tablet Take 81 mg by mouth daily.     Marland Kitchen atorvastatin (LIPITOR) 40 MG tablet Take 1 tablet (40 mg total) by mouth daily at 6 PM. 30 tablet 0  . furosemide (LASIX) 40 MG tablet Take 20-40 mg by mouth 2 (two) times daily. Take 40 mg (one tablet) in the morning and 20 mg (half tablet) in the evening.    . isosorbide mononitrate (IMDUR) 60 MG 24 hr tablet Take 1 tablet (60 mg total) by mouth daily. 30 tablet 0  . JARDIANCE 25 MG TABS tablet Take 25 mg by  mouth every morning.    . metoprolol tartrate (LOPRESSOR) 100 MG tablet Take 1 tablet (100 mg total) by mouth 2 (two) times daily. 60 tablet 6  . nitroGLYCERIN (NITROSTAT) 0.4 MG SL tablet Place 0.4 mg under the tongue every 5 (five) minutes as needed.     . ticagrelor (BRILINTA) 90 MG TABS tablet Take 1 tablet (90 mg total) by mouth 2 (two) times daily. 60 tablet 0  . TRESIBA FLEXTOUCH 100 UNIT/ML SOPN FlexTouch Pen Inject 20 Units into the skin daily.     . TRULICITY 3 ZH/2.9JM SOPN once a week.     No current facility-administered medications for this visit.    Allergies:   Codeine and Penicillins   Social History:  The patient  reports that she quit smoking about 34 years ago. She has never used smokeless tobacco. She reports that she does not drink alcohol and does not use drugs.   Family History:  The patient's family history includes CAD in her brother and brother; Cancer in her father and sister; Diabetes in her mother; Hypertension in her mother; Stroke in her mother.  ROS:  Please see the history of present illness.    All other systems are reviewed and otherwise negative.   PHYSICAL EXAM: VS:  BP (!) 122/58   Pulse 74   Ht 5' 9"  (1.753 m)   Wt 188 lb 9.6 oz (85.5 kg)   SpO2 95%   BMI 27.85 kg/m  BMI: Body mass index is 27.85 kg/m. Well nourished, well developed, in no acute distress  HEENT: normocephalic, atraumatic  Neck: no JVD, carotid bruits or masses Cardiac:  RRR; no significant murmurs, no rubs, or gallops Lungs:  CTA b/l, no wheezing, rhonchi or rales  Abd: soft, nontender MS: no deformity, age appropriate atrophy Ext: she has a brawny type edema, no pitting edema (reported her baseline for quite a long time)  Skin: warm and dry, no rash Neuro:  No gross deficits appreciated Psych: euthymic mood, full affect  ICD site is stable, no tethering or discomfort   EKG:  Done today shows  SR, Q III, no ST/T changes, baseline interference  ICD interrogation  done today and reviewed by myself: Battery and lead measurements are OK No additional events since the known VF  01/05/2020, VF > single successful shock, 40J had clean break She had been having some NSVT in April    03/30/2020 LEFT HEART CATH AND CORONARY ANGIOGRAPHY  Conclusion   Severe three-vessel coronary disease including borderline significant left main disease.  The right coronary is heavily stented from proximal to distal and is totally occluded proximally.  The right coronary fills by left-to-right collaterals.  The left main dampens with engagement using a 5 French catheter. There is 40 to 50% ostial to mid left main narrowing.  The LAD is heavily calcified and stented from proximal to distal. At least 3 stents are noted. There is diffuse in-stent restenosis in the proximal LAD with up to 50% narrowing. Just distal to the stent there is eccentric 85 to 90% stenosis. An adjacent diagonal contains proximal diffuse 80 to 90% stenosis forming a [place] 011 bifurcation stenosis. There is distal LAD disease beyond the third stent.  The circumflex is relatively small. The first obtuse marginal/ramus contains segmental 95% stenosis from ostial to proximal and apparently has previously undergone attempted PCI and was labeled "non-dilatable". The circumflex then becomes very small after the first marginal giving to small to moderate sized obtuse marginal branches with a 70% stenosis proximal to the third obtuse marginal.  Left ventricular systolic dysfunction with EF 35 to 40% with regional wall motion abnormality involving the anterior wall and inferior wall. LVEDP is 21 mmHg consistent with chronic combined systolic and diastolic heart failure.  RECOMMENDATIONS:   Heart team approach: Need to determine surgical versus interventional versus medical therapy. After completing the procedure and speaking to family members, apparently the patient has had altered cognitive  function/interaction since her myocardial infarction. She suffered cardiac arrest and had 12 minutes of CPR according to the daughter.  After discussion, perhaps PCI will be her revascularization approach but will be high risk. At this point it appears that treating the LAD diagonal or simply the LAD is technically possible although with heavy calcification and prior history of non-dilatable lesion, perhaps an atheroablative technique should be considered.     04/01/2019: TTE  IMPRESSIONS 1. The left ventricle has moderately reduced systolic function, with an ejection fraction of 35-40%. The cavity size was normal. Left ventricular diastolic Doppler parameters are consistent with impaired relaxation. 2. The right ventricle has normal systolic function. The cavity was normal. There is no increase in right ventricular wall thickness. 3. The mitral valve is abnormal. Moderate thickening of the mitral valve leaflet. Moderate calcification of the mitral valve leaflet. 4. The tricuspid valve is grossly normal. 5. The aortic valve is abnormal. Moderate thickening of the aortic valve. Moderate calcification of the aortic valve. Aortic valve regurgitation is mild by color flow Doppler. Mild stenosis of the aortic valve. 6. The aorta is normal unless otherwise noted. 7. The aortic root and ascending aorta are normal in size and structure. 8. The atrial septum is grossly normal.    04/08/2019 Coronary Stent Intervention w/Impella  CORONARY ATHERECTOMY  Conclusion  Successful atherectomy and stenting of the proximal LAD and left mainstem with Impella CP hemodynamic support  Recommendations  Antiplatelet/Anticoag Recommend dual antiplatelet therapy with Aspirin 15m daily and Ticagrelor 950mtwice daily long-term (beyond 12 months) because of left main stenting/diabetes.  Surgeon Notes    03/31/2019 1:04 PM CV Procedure signed by SmBelva CromeMD  Indications  Non-ST elevation  (NSTEMI) myocardial infarction ([place]I21.4 (ICD-10-CM)]  Procedural Details  Technical Details INDICATION: [id number] old woman with severe multivessel coronary artery disease and recurrent acute pulmonary edema and elevated cardiac enzymes consistent with non-ST elevation infarction complicated by acute heart failure. She underwent diagnostic catheterization demonstrating moderate left main stenosis, severe proximal LAD stenosis, and chronic total occlusion of the RCA with left-to-right collaterals. Initial medical therapy was recommended because of the patient's relatively poor functional capacity. She was not deemed candidate  for consideration of cardiac surgery. Unfortunately she had a recurrent respiratory failure event with acute pulmonary edema. After review of treatment options, we elected to proceed with hemodynamically supported PCI of the left mainstem and LAD.  PROCEDURAL DETAILS:  The right groin is prepped, draped, and anesthetized with 1% lidocaine. Using US guidance, a micropuncture sheath is inserted into the right femoral artery. A femoral angiogram is performed demonstrating appropriate position over the femoral head. Double preclosed technique is used to preclosed the artery. Using a 0.035 inch wire, the right femoral artery is progressively dilated and a 14 French Impella sheath is inserted under fluoroscopic guidance. Angiomax was then started for anticoagulation. The Impella catheter is inserted using normal technique into the left ventricle over the Impella wire which was changed out after recording LV pressure with a pigtail catheter. Impella catheter position was deemed appropriate by fluoroscopic landmarks, hemodynamic data, and motor current waveforms. At that point, the membrane of the hemostatic valve is punctured with a micropuncture needle and wire. The membrane is then predilated with 7 [place] dilator. I tried to advance a destination sheath but it would not  advance far enough into the body even over a Lunderquist wire. I was able to use a 6 [place] short sheath without difficulty. A 6 [place] EBU guide was then advanced into the left coronary ostium with fairly marked pressure dampening. The vessel was quickly wired with a cougar wire and guide catheter was then pulled back into the aorta. I was then able to wire the vessel with a Viper flex wire without difficulty and this was advanced into the apical portion of the LAD. CSI diamondback atherectomy was performed at low and high speed in both the proximal and mid LAD as well as the left main stem. Multiple runs were made and the patient tolerated this very well from a hemodynamic perspective. The CSI crown was then removed and the cougar wire was advanced back into the distal LAD. The LAD and left main are both predilated with a 3.0 mm balloon. The LAD had a significant waist through the calcified lesion. The LAD was then stented with a 3.0 x 28 mm Synergy drug-eluting stent. The stent is poorly expanded throughout the proximal and midportion. The stent is postdilated with a 3.25 mm noncompliant balloon to high pressure of 24 atm and finally yielded at that pressure. The left main is then stented with a 4.0 x 16 mm Synergy DES deployed at 16 atm. The patient had a marked blood pressure drop during left main stent inflation. The stent was deployed quickly and she recovered quickly. The left main was then postdilated with a 4.5 mm noncompliant balloon to 16 atm. The patient tolerated the entire procedure well. Final angiography was performed. The guide catheter and wire are removed. The sheath is removed from the hemostatic membrane of the Impella sheath. The Impella sheath was then removed and the Perclose sutures are tightened.      Recent Labs: 01/21/2019: TSH 2.030 04/03/2019: B Natriuretic Peptide 332.0 04/07/2019: ALT 15 04/13/2019: Magnesium 1.9 04/29/2019: BUN 59; Creatinine, Ser 2.00;  Hemoglobin 13.1; NT-Pro BNP 2,046; Platelets 337; Potassium 3.5; Sodium 140  03/31/2019: Cholesterol 125; HDL 42; LDL Cholesterol 70; Total CHOL/HDL Ratio 3.0; VLDL 13 04/08/2019: Triglycerides 64   CrCl cannot be calculated (Patient's most recent lab result is older than the maximum 21 days allowed.).   Wt Readings from Last 3 Encounters:  01/20/20 188 lb 9.6 oz (85.5 kg)  04/29/19 196 lb (88.9  kg)  04/14/19 206 lb 12.8 oz (93.8 kg)     Other studies reviewed: Additional studies/records reviewed today include: summarized above  ASSESSMENT AND PLAN:  1. VF     Agree with up titration of her BB  Discussed with DOD given was a VF event Known significant CAD, not likely to have any further revascularization options, not likely that her stents from Aug 2020 have shut down acutely No ischemic w/u  BMET and mag today  If more arrhythmia, would add AAD mexiletine or amio  2. CAD     No CP     Reports compliance with her medicines     ASA, Brilinta, BB, statin     C/w Dr. Bettina Gavia  3. ICM 4. Chronic CHF (systolic)     She does not appear volume OL currently  5. ICD     Intact function, no programming changes made        Disposition: F/u with Dr. Loney Hering in a few weeks, Dr. Curt Bears in 6mo Current medicines are reviewed at length with the patient today.  The patient did not have any concerns regarding medicines.  SVenetia Night PA-C 01/20/2020 5:12 PM     [place] [phone](office)  [phone](fax)

## 2020-01-20 NOTE — Patient Instructions (Addendum)
Medication Instructions:  none *If you need a refill on your cardiac medications before your next appointment, please call your pharmacy*   Lab Work:  [place] If you have labs (blood work) drawn today and your tests are completely normal, you will receive your results only by: Marland Kitchen MyChart Message (if you have MyChart) OR . A paper copy in the mail If you have any lab test that is abnormal or we need to change your treatment, we will call you to review the results.   Testing/Procedures: none   Follow-Up:  PLEASE SCHEDULE WITH Dr Bettina Gavia or APP in [place] 2-4 weeks  At [facility], you and your health needs are our priority.  As part of our continuing mission to provide you with exceptional heart care, we have created designated Provider Care Teams.  These Care Teams include your primary Cardiologist (physician) and Advanced Practice Providers (APPs -  Physician Assistants and Nurse Practitioners) who all work together to provide you with the care you need, when you need it.  We recommend signing up for the patient portal called "MyChart".  Sign up information is provided on this After Visit Summary.  MyChart is used to connect with patients for Virtual Visits (Telemedicine).  Patients are able to view lab/test results, encounter notes, upcoming appointments, etc.  Non-urgent messages can be sent to your provider as well.   To learn more about what you can do with MyChart, go to [email or web address].    Your next appointment:   3 months  The format for your next appointment:   In Person  Provider:   Dr Curt Bears in [place]   Other Instructions Remote monitoring is used to monitor your ICD from home. This monitoring reduces the number of office visits required to check your device to one time per year. It allows [place] to keep an eye on the functioning of your device to ensure it is working properly. You are scheduled for a device check from home on 03/12/20. You may send  your transmission at any time that day. If you have a wireless device, the transmission will be sent automatically. After your physician reviews your transmission, you will receive a postcard with your next transmission date.

## 2020-01-21 LAB — BASIC METABOLIC PANEL
BUN/Creatinine Ratio: 23 (ref 12–28)
BUN: 46 mg/dL — ABNORMAL HIGH (ref 8–27)
CO2: 24 mmol/L (ref 20–29)
Calcium: 8.8 mg/dL (ref 8.7–10.3)
Chloride: 96 mmol/L (ref 96–106)
Creatinine, Ser: 1.99 mg/dL — ABNORMAL HIGH (ref 0.57–1.00)
GFR calc Af Amer: 28 mL/min/{1.73_m2} — ABNORMAL LOW (ref >59)
GFR calc non Af Amer: 24 mL/min/{1.73_m2} — ABNORMAL LOW (ref >59)
Glucose: 303 mg/dL — ABNORMAL HIGH (ref 65–99)
Potassium: 3.9 mmol/L (ref 3.5–5.2)
Sodium: 141 mmol/L (ref 134–144)

## 2020-01-21 LAB — MAGNESIUM: Magnesium: 2.2 mg/dL (ref 1.6–2.3)

## 2020-01-26 ENCOUNTER — Ambulatory Visit: Payer: Medicare Other | Admitting: Podiatry

## 2020-02-09 ENCOUNTER — Encounter: Payer: Self-pay | Admitting: Podiatry

## 2020-02-09 ENCOUNTER — Other Ambulatory Visit: Payer: Self-pay

## 2020-02-09 ENCOUNTER — Ambulatory Visit (INDEPENDENT_AMBULATORY_CARE_PROVIDER_SITE_OTHER): Payer: Medicare Other | Admitting: Podiatry

## 2020-02-09 DIAGNOSIS — E1142 Type 2 diabetes mellitus with diabetic polyneuropathy: Secondary | ICD-10-CM | POA: Diagnosis not present

## 2020-02-09 DIAGNOSIS — M79675 Pain in left toe(s): Secondary | ICD-10-CM

## 2020-02-09 DIAGNOSIS — I739 Peripheral vascular disease, unspecified: Secondary | ICD-10-CM

## 2020-02-09 DIAGNOSIS — B351 Tinea unguium: Secondary | ICD-10-CM

## 2020-02-09 DIAGNOSIS — M79674 Pain in right toe(s): Secondary | ICD-10-CM

## 2020-02-09 NOTE — Progress Notes (Signed)
Subjective: Lori Rivera is a 74 y.o. female patient seen today at [facility]. Pt has h/o NIDDM with chronic kidney disease and painful mycotic nails b/l that are difficult to trim. Pain interferes with ambulation. Aggravating factors include wearing enclosed shoe gear. Pain is relieved with periodic professional debridement.   She states she has been away due to the pandemic. She voices no new pedal concerns on today's visit.  Past Medical History:  Diagnosis Date   Aortic regurgitation 09/24/2017   Arthritis 09/24/2017   CAD in native artery 03/07/2015   Overview:  1. Out of hospital cardiac arrest Aug 2016 with MI, shock, PCI and 2 Xience DES to mid and distal culprit RCA with diffuse LAD disease and hypothermia protocol 2. NonSTEMI,05/27/15  Conclusions: 1. Pt was turned down by CTS due to co-morbidities. Pt is for PCI to multiple lesions on LAD and RCA. Ramus is small, will treat medically. 2. Successful PCI / Xience Drug Eluting Stent of the m   Chest pain 08/08/2015   CHF (congestive heart failure) ([place]) 09/24/2017   Chronic combined systolic and diastolic heart failure ([place]) 08/19/2015   Overview:  Echo 08/03/15: Left Ventricle Normal LV chamber size. Normal LV wall thickness. Mildly reduced overall LV systolic function. Estimated LVEF 45% Inferior, posterior and inferoseptal hypokinesis. Abnormal LV diastolic function, Grade 2, with echo evidence of elevated LA pressure.  Overview:  MUGA 20% 09/03/16 Overview:  Echo 08/03/15: Left Ventricle Normal LV chamber size. 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 Pt was turned down by CTS due to co-morbidities. Pt is for PCI to multiple lesions on LAD and RCA. Ramus is small, will treat medically. 2. Successful PCI / Xience Drug Eluting Stent of the mid and distal Left Anterior Descending Coronary Artery. 3. Successful PCI / Xience Drug Eluting Stent of the ostial and mid Right Coronary Artery. 4. POBA to Di   PNA (pneumonia) 08/08/2015   Pneumonia due to infectious organism 08/19/2015   Overview:  Discharge summary 08/11/15: Her chest x-ray showed airspace opacities concerning for multifocal pneumonia and she will be discharged on oral Levaquin. Follow-up chest x-ray is recommended to ensure resolution.   Shortness of breath 09/24/2017   ST elevation myocardial infarction involving right coronary artery ([place]) 08/03/2015   Overview:  Due to ISR and acute stent thrombosis Cath 08/03/15: Diagnostic procedure: Left Heart Cath PCI procedure: Stent DES, Thrombectomy Complications: No Complications. Conclusions Diagnostic Summary Acute occlusion of RCA ostial stent thrombosis Severe stenosis of the mid RCA, partially ISR Patent LAD stent CTO of small circ system Interventional Summary Successful Thrombectomy/PTCA of the o   STEMI (ST elevation myocardial infarction) ([place]) 08/03/2015   Type 2 diabetes mellitus, without long-term current use of insulin ([place]) 03/07/2015   Vitamin B 12 deficiency 09/24/2017   Vitamin D deficiency 09/24/2017    Patient Active Problem List  Diagnosis Date Noted   Stage III pressure ulcer of sacral region ([place]) 04/12/2019   Acute respiratory failure with hypoxia (HCC) 04/07/2019   Cardiogenic pulmonary edema (HCC)    Unstable  angina ([place]) 03/31/2019   Pressure injury of skin 03/31/2019   Palpitations 01/17/2019   PAT (paroxysmal atrial tachycardia) ([place]) 01/17/2019   Acute renal failure superimposed on stage 3 chronic kidney disease ([place]) 11/27/2017   Arthritis 09/24/2017   CHF (congestive heart failure) ([place]) 09/24/2017   Shortness of breath 09/24/2017   Aortic regurgitation 09/24/2017   Vitamin B 12 deficiency 09/24/2017   Vitamin D deficiency 09/24/2017   Essential hypertension 06/29/2017   Coronary artery disease involving native heart with angina pectoris ([place]) 06/29/2017   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 04/15/2017   Ischemic cardiomyopathy 11/18/2015   Chronic combined systolic and diastolic heart failure ([place]) 08/19/2015   Pneumonia due to infectious organism 08/19/2015   Chest pain 08/08/2015   PNA (pneumonia) 08/08/2015   ST elevation myocardial infarction involving right coronary artery ([place]) 08/03/2015   STEMI (ST elevation myocardial infarction) ([place]) 08/03/2015   Acute on chronic systolic heart failure ([place]) 06/07/2015   CKD (chronic kidney disease) stage 3, GFR 30-59 ml/min ([place]) 06/07/2015   NSTEMI (non-ST elevated myocardial infarction) ([place]) 05/25/2015   CAD in native artery 03/07/2015   Hyperlipidemia 03/07/2015   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      atorvastatin (LIPITOR) 40 MG tablet Take 1 tablet (40 mg total) by mouth daily at 6 PM. 30 tablet 0   BD PEN NEEDLE NANO 2ND GEN 32G X 4 MM MISC      furosemide (LASIX) 40 MG tablet Take 20-40 mg by mouth 2 (two) times daily. Take 40 mg (one tablet) in the morning and 20 mg (half tablet) in the evening.      isosorbide mononitrate (IMDUR) 60 MG 24 hr tablet Take 1 tablet (60 mg total) by mouth daily. 30 tablet 0   JARDIANCE 25 MG TABS tablet Take 25 mg by mouth every morning.     metoprolol tartrate (LOPRESSOR) 100 MG tablet Take 1 tablet (100 mg total) by mouth 2 (two) times daily. 60 tablet 6   nitroGLYCERIN (NITROSTAT) 0.4 MG SL tablet Place 0.4 mg under the tongue every 5 (five) minutes as needed.      ticagrelor (BRILINTA) 90 MG TABS tablet Take 1 tablet (90 mg total) by mouth 2 (two) times daily. 60 tablet 0   TRESIBA FLEXTOUCH 100 UNIT/ML SOPN FlexTouch Pen Inject 20 Units into the skin daily.      TRULICITY 3 VE/9.3YB SOPN once a week.     No current facility-administered medications on file prior to visit.    Allergies  Allergen Reactions   Codeine Other (See Comments)    confused   Penicillins Rash    Did it involve swelling of the face/tongue/throat, SOB, or low BP? Yes Did it involve sudden or severe rash/hives, skin peeling, or any reaction on the inside of your mouth or nose? No Did you need to seek medical attention at a hospital or doctor's office? Yes When did it last happen?20 yrs ago If all above answers are NO, may proceed with cephalosporin use.  Objective: Physical Exam  General: Patient is a pleasant 74 y.o. Caucasian female in NAD. AAO x 3.   Neurovascular Examination: Capillary refill time to digits <4 seconds b/l lower extremities. Faintly palpable DP pulse(s) b/l lower extremities. Nonpalpable PT pulse(s) b/l lower extremities. Pedal hair absent. Lower extremity skin temperature gradient within normal limits. No pain with calf compression b/l. +1 pitting edema b/l lower extremities. Varicosities present b/l.   Protective sensation intact 5/5 intact bilaterally with 10g monofilament b/l. Vibratory sensation diminished b/l. Proprioception intact bilaterally. Clonus negative b/l.  Dermatological:  Pedal skin with normal turgor, texture and  tone bilaterally. No open wounds bilaterally. No interdigital macerations bilaterally. Toenails 1-5 b/l elongated, discolored, dystrophic, thickened, crumbly with subungual debris and tenderness to dorsal palpation.  Musculoskeletal:  Normal muscle strength 5/5 to all lower extremity muscle groups bilaterally. No pain crepitus or joint limitation noted with ROM b/l. Hammertoes noted to the b/l lower extremities. Pes planus deformity noted b/l.   Assessment and Plan:  1. Pain due to onychomycosis of toenails of both feet   2. Diabetic polyneuropathy associated with type 2 diabetes mellitus ([place])   3. PVD (peripheral vascular disease) ([place])    -Examined patient. -No new findings. No new orders. -Continue diabetic foot care principles. -Toenails 1-5 b/l were debrided in length and girth with sterile nail nippers and dremel without iatrogenic bleeding.  -Patient to report any pedal injuries to medical professional immediately. -Patient to continue soft, supportive shoe gear daily. -Patient/POA to call should there be question/concern in the interim.  Return in about 3 months (around 05/11/2020).  Marzetta Board, DPM

## 2020-02-22 NOTE — Progress Notes (Signed)
Cardiology Office Note:    Date:  02/23/2020   ID:  Lori Rivera, DOB [birth year], MRN [id number]  PCP:  Garwin Brothers, MD  Cardiologist:  Shirlee More, MD    Referring MD: Garwin Brothers, MD    ASSESSMENT:    1. Chronic systolic congestive heart failure ([place])   2. CAD in native artery   3. Cardiac defibrillator in situ   4. VF (ventricular fibrillation) ([place])   5. Benign hypertension with CKD (chronic kidney disease) stage IV ([place])   6. Mixed hyperlipidemia    PLAN:    In order of problems listed above:  1. I have taken care of Lori Rivera for well over a decade and overall she is doing well.  She is quite debilitated frail and is cared for and supervised by her family.  Her heart failure is compensated on her current loop diuretic beta-blocker and she is not on ACE inhibitor or ARB because of previous severe symptomatic hypotension.  Her family continue to restrict sodium weight daily and supervised medications 2. Stable CAD having no anginal discomfort continue medical therapy dual antiplatelet beta-blocker oral nitrate and statin, 1 year after initiating Brilinta will transition to clopidogrel 3. Stable no recent device therapy followed in our clinic 4. Stable hypertension current medication she has significant stage IV CKD 5. Stable high risk continue high intensity statin   Next appointment: 3 months   Medication Adjustments/Labs and Tests Ordered: Current medicines are reviewed at length with the patient today.  Concerns regarding medicines are outlined above.  No orders of the defined types were placed in this encounter.  No orders of the defined types were placed in this encounter.  Complaint overdue for cardiology follow-up  History of Present Illness:    Lori Rivera is a 74 y.o. female with a hx of coronary artery disease multiple PCI's heart failure last EF 45% hypertension dyslipidemia and type 2 diabetes  last seen by me 01/17/2019. She is missed several office  appointments in the interim and most recently she has had atherectomy and stenting of the proximal LAD and left main with Impella support Dr. Daneen Schick 04/08/2019 decent ejection fraction 04/01/2019 of 35 to 40% and her last device download from 01/20/2020 showed battery and lead measurements normal no additional treatment since her last known VF episode on 01/05/2020.  We have had difficulty in the past with severe symptomatic orthostatic hypotension to her diabetes and neuropathy.  Significant stage IV CKD her last creatinine 1.99 GFR 24 cc.   1 mo ago  (01/20/20) 10 mo ago  (04/29/19) 10 mo ago  (04/14/19)   Glucose 65 - 99 mg/dL 303High  233High  208High R   BUN 8 - 27 mg/dL 46High  59High  53High R   Creatinine, Ser 0.57 - 1.00 mg/dL 1.99High  2.00High  1.82High R   GFR calc non Af Amer >59 mL/min/1.73 24Low  24Low  27Low R   GFR calc Af Amer >59 mL/min/1.73 28Low  28Low  31Low R    Compliance with diet, lifestyle and medications: Yes  She recently office along with her daughter.  Since her last ICD therapy in May she has done well she is pleased with the quality of her life her weights are stable blood pressure runs 1 01-0 20 systolic she has had no angina or syncopal episodes.  The family supervises medications she is sodium restricted.  She had recent lab work performed at her PCP office including a BMP her creatinine  was stable 1.99 TSH was normal 2.03.  Her most recent lipid profile is at target cholesterol 125 HDL 42 LDL 70.  Diabetic treatment was adjusted and her blood sugars now run in the range of 140 mg percent in the morning.  The patient and her daughter are pleased with her quality of life.  She is a little traumatized by her last ICD therapy I reviewed the results of her recent download with the patient and her daughter who participated in the evaluation Past Medical History:  Diagnosis Date  . Aortic regurgitation 09/24/2017  . Arthritis 09/24/2017  .  CAD in native artery 03/07/2015   Overview:  1. Out of hospital cardiac arrest Aug 2016 with MI, shock, PCI and 2 Xience DES to mid and distal culprit RCA with diffuse LAD disease and hypothermia protocol 2. NonSTEMI,05/27/15  Conclusions: 1. Pt was turned down by CTS due to co-morbidities. Pt is for PCI to multiple lesions on LAD and RCA. Ramus is small, will treat medically. 2. Successful PCI / Xience Drug Eluting Stent of the m  . Chest pain 08/08/2015  . CHF (congestive heart failure) ([place]) 09/24/2017  . Chronic combined systolic and diastolic heart failure ([place]) 08/19/2015   Overview:  Echo 08/03/15: Left Ventricle Normal LV chamber size. Normal LV wall thickness. Mildly reduced overall LV systolic function. Estimated LVEF 45% Inferior, posterior and inferoseptal hypokinesis. Abnormal LV diastolic function, Grade 2, with echo evidence of elevated LA pressure.  Overview:  MUGA 20% 09/03/16 Overview:  Echo 08/03/15: Left Ventricle Normal LV chamber size. Normal LV wall   . Chronic systolic heart failure ([place]) 06/07/2015  . CKD (chronic kidney disease) stage 3, GFR 30-59 ml/min 06/07/2015  . Coronary artery disease involving native heart with angina pectoris ([place]) 06/29/2017   Overview:  Added automatically from request for surgery [id number]  . 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 Essential hypertension 06/29/2017  . Hyperlipidemia 03/07/2015  . 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 Ischemic cardiomyopathy 11/18/2015   Overview:  EF 20-25%  . NSTEMI (non-ST elevated myocardial infarction) ([place]) 05/25/2015   Overview:  Cardiac cath 05/28/15 :Conclusions 1. Pt was turned down by CTS due to co-morbidities. Pt is for PCI to multiple lesions on LAD and RCA. Ramus is small, will treat medically. 2. Successful PCI / Xience Drug Eluting Stent of the mid and distal Left Anterior Descending Coronary Artery. 3.  Successful PCI / Xience Drug Eluting Stent of the ostial and mid Right Coronary Artery. 4. POBA to Di  . PNA (pneumonia) 08/08/2015  . Pneumonia due to infectious organism 08/19/2015   Overview:  Discharge summary 08/11/15: Her chest x-ray showed airspace opacities concerning for multifocal pneumonia and she will be discharged on oral Levaquin. Follow-up chest x-ray is recommended to ensure resolution.  . Shortness of breath 09/24/2017  . ST elevation myocardial infarction involving right coronary artery ([place]) 08/03/2015   Overview:  Due to ISR and acute stent thrombosis Cath 08/03/15: Diagnostic procedure: Left Heart Cath PCI procedure: Stent DES, Thrombectomy Complications: No Complications. Conclusions Diagnostic Summary Acute occlusion of RCA ostial stent thrombosis Severe stenosis of the mid RCA, partially ISR Patent LAD stent CTO of small circ system Interventional Summary Successful Thrombectomy/PTCA of the o  . STEMI (ST elevation myocardial infarction) ([place]) 08/03/2015  . Type 2 diabetes mellitus, without long-term current use of insulin ([place]) 03/07/2015  . Vitamin B 12 deficiency 09/24/2017  .  Vitamin D deficiency 09/24/2017    Past Surgical History:  Procedure Laterality Date  . C-spine surgery    . CATARACT EXTRACTION, BILATERAL    . CHOLECYSTECTOMY    . CORONARY ANGIOPLASTY WITH STENT PLACEMENT    . CORONARY ATHERECTOMY N/A 04/08/2019   Procedure: CORONARY ATHERECTOMY;  Surgeon: Sherren Mocha, MD;  Location: [facility];  Service: Cardiovascular;  Laterality: N/A;  . CORONARY STENT INTERVENTION W/IMPELLA N/A 04/08/2019   Procedure: Coronary Stent Intervention w/Impella;  Surgeon: Sherren Mocha, MD;  Location: [facility];  Service: Cardiovascular;  Laterality: N/A;  . INSERT / REPLACE / REMOVE PACEMAKER     ICD, Medtronic  . LEFT HEART CATH AND CORONARY ANGIOGRAPHY N/A 03/31/2019   Procedure: LEFT HEART CATH AND CORONARY ANGIOGRAPHY;  Surgeon: Belva Crome, MD;   Location: [facility];  Service: Cardiovascular;  Laterality: N/A;  . LITHOTRIPSY    . URETEROSCOPY      Current Medications: Current Meds  Medication Sig  . aspirin EC 81 MG tablet Take 81 mg by mouth daily.   Marland Kitchen atorvastatin (LIPITOR) 40 MG tablet Take 1 tablet (40 mg total) by mouth daily at 6 PM.  . BD PEN NEEDLE NANO 2ND GEN 32G X 4 MM MISC   . furosemide (LASIX) 40 MG tablet Take 20-40 mg by mouth 2 (two) times daily. Take 40 mg (one tablet) in the morning and 20 mg (half tablet) in the evening.  . isosorbide mononitrate (IMDUR) 60 MG 24 hr tablet Take 1 tablet (60 mg total) by mouth daily.  Marland Kitchen JARDIANCE 25 MG TABS tablet Take 25 mg by mouth every morning.  . metoprolol tartrate (LOPRESSOR) 100 MG tablet Take 1 tablet (100 mg total) by mouth 2 (two) times daily.  . nitroGLYCERIN (NITROSTAT) 0.4 MG SL tablet Place 0.4 mg under the tongue every 5 (five) minutes as needed.   . ticagrelor (BRILINTA) 90 MG TABS tablet Take 1 tablet (90 mg total) by mouth 2 (two) times daily.  Tyler Aas FLEXTOUCH 100 UNIT/ML SOPN FlexTouch Pen Inject 20 Units into the skin daily.   . TRULICITY 3 YV/8.5FY SOPN once a week.     Allergies:   Codeine and Penicillins   Social History   Socioeconomic History  . Marital status: Widowed    Spouse name: Not on file  . Number of children: Not on file  . Years of education: Not on file  . Highest education level: Not on file  Occupational History  . Not on file  Tobacco Use  . Smoking status: Former Smoker    Quit date: 06/07/1985    Years since quitting: 34.7  . Smokeless tobacco: Never Used  Vaping Use  . Vaping Use: Never used  Substance and Sexual Activity  . Alcohol use: No  . Drug use: No  . Sexual activity: Not on file  Other Topics Concern  . Not on file  Social History Narrative  . Not on file   Social Determinants of Health   Financial Resource Strain:   . Difficulty of Paying Living Expenses:   Food Insecurity:   . Worried  About Charity fundraiser in the Last Year:   . Arboriculturist in the Last Year:   Transportation Needs:   . Film/video editor (Medical):   Marland Kitchen Lack of Transportation (Non-Medical):   Physical Activity:   . Days of Exercise per Week:   . Minutes of Exercise per Session:  Stress:   . Feeling of Stress :   Social Connections:   . Frequency of Communication with Friends and Family:   . Frequency of Social Gatherings with Friends and Family:   . Attends Religious Services:   . Active Member of Clubs or Organizations:   . Attends Archivist Meetings:   Marland Kitchen Marital Status:      Family History: The patient's family history includes CAD in her brother and brother; Cancer in her father and sister; Diabetes in her mother; Hypertension in her mother; Stroke in her mother. ROS:   Please see the history of present illness.    All other systems reviewed and are negative.  EKGs/Labs/Other Studies Reviewed:    The following studies were reviewed today:    Recent Labs: 04/03/2019: B Natriuretic Peptide 332.0 04/07/2019: ALT 15 04/29/2019: Hemoglobin 13.1; NT-Pro BNP 2,046; Platelets 337 01/20/2020: BUN 46; Creatinine, Ser 1.99; Magnesium 2.2; Potassium 3.9; Sodium 141  Recent Lipid Panel    Component Value Date/Time   CHOL 125 03/31/2019 0750   TRIG 64 04/08/2019 0222   HDL 42 03/31/2019 0750   CHOLHDL 3.0 03/31/2019 0750   VLDL 13 03/31/2019 0750   LDLCALC 70 03/31/2019 0750    Physical Exam:    VS:  BP 120/68 (BP Location: Right Arm, Patient Position: Sitting, Cuff Size: Normal)   Pulse 60   Ht 5\' 9"  (1.753 m)   Wt 191 lb 9.6 oz (86.9 kg)   SpO2 95%   BMI 28.29 kg/m     Wt Readings from Last 3 Encounters:  02/23/20 191 lb 9.6 oz (86.9 kg)  01/20/20 188 lb 9.6 oz (85.5 kg)  04/29/19 196 lb (88.9 kg)     GEN: She looks frail well nourished, well developed in no acute distress HEENT: Normal NECK: No JVD; No carotid bruits LYMPHATICS: No  lymphadenopathy CARDIAC: Soft S1 RRR, no murmurs, rubs, gallops RESPIRATORY:  Clear to auscultation without rales, wheezing or rhonchi  ABDOMEN: Soft, non-tender, non-distended MUSCULOSKELETAL:  No edema; No deformity  SKIN: Warm and dry NEUROLOGIC:  Alert and oriented x 3 PSYCHIATRIC:  Normal affect    Signed, Shirlee More, MD  02/23/2020 12:00 PM    [place]

## 2020-02-23 ENCOUNTER — Encounter: Payer: Self-pay | Admitting: Cardiology

## 2020-02-23 ENCOUNTER — Ambulatory Visit (INDEPENDENT_AMBULATORY_CARE_PROVIDER_SITE_OTHER): Payer: Medicare Other | Admitting: Cardiology

## 2020-02-23 ENCOUNTER — Other Ambulatory Visit: Payer: Self-pay

## 2020-02-23 VITALS — BP 120/68 | HR 60 | Ht 69.0 in | Wt 191.6 lb

## 2020-02-23 DIAGNOSIS — I2510 Atherosclerotic heart disease of native coronary artery without angina pectoris: Secondary | ICD-10-CM | POA: Diagnosis not present

## 2020-02-23 DIAGNOSIS — I129 Hypertensive chronic kidney disease with stage 1 through stage 4 chronic kidney disease, or unspecified chronic kidney disease: Secondary | ICD-10-CM

## 2020-02-23 DIAGNOSIS — I5022 Chronic systolic (congestive) heart failure: Secondary | ICD-10-CM | POA: Diagnosis not present

## 2020-02-23 DIAGNOSIS — E782 Mixed hyperlipidemia: Secondary | ICD-10-CM

## 2020-02-23 DIAGNOSIS — I4901 Ventricular fibrillation: Secondary | ICD-10-CM | POA: Diagnosis not present

## 2020-02-23 DIAGNOSIS — Z95810 Presence of automatic (implantable) cardiac defibrillator: Secondary | ICD-10-CM | POA: Diagnosis not present

## 2020-02-23 DIAGNOSIS — N184 Chronic kidney disease, stage 4 (severe): Secondary | ICD-10-CM

## 2020-02-23 NOTE — Patient Instructions (Signed)

## 2020-03-12 ENCOUNTER — Ambulatory Visit (INDEPENDENT_AMBULATORY_CARE_PROVIDER_SITE_OTHER): Payer: Medicare Other | Admitting: *Deleted

## 2020-03-12 DIAGNOSIS — I5022 Chronic systolic (congestive) heart failure: Secondary | ICD-10-CM

## 2020-03-12 DIAGNOSIS — I255 Ischemic cardiomyopathy: Secondary | ICD-10-CM

## 2020-03-12 LAB — CUP PACEART REMOTE DEVICE CHECK
Date Time Interrogation Session: 20210802131345
Implantable Lead Implant Date: 20180412
Implantable Lead Implant Date: 20180412
Implantable Lead Location: 753859
Implantable Lead Location: 753860
Implantable Lead Model: 377
Implantable Lead Model: 402266
Implantable Lead Serial Number: 4970639
Implantable Lead Serial Number: 49730543
Implantable Pulse Generator Implant Date: 20180412
Pulse Gen Model: 404623
Pulse Gen Serial Number: 60967865

## 2020-03-14 NOTE — Progress Notes (Signed)
Remote ICD transmission.   

## 2020-05-24 ENCOUNTER — Ambulatory Visit: Payer: Medicare Other | Admitting: Podiatry

## 2020-05-25 ENCOUNTER — Ambulatory Visit: Payer: Medicare Other | Admitting: Cardiology

## 2020-06-07 ENCOUNTER — Ambulatory Visit: Payer: Medicare Other | Admitting: Podiatry

## 2020-06-11 ENCOUNTER — Ambulatory Visit (INDEPENDENT_AMBULATORY_CARE_PROVIDER_SITE_OTHER): Payer: Medicare Other

## 2020-06-11 DIAGNOSIS — I255 Ischemic cardiomyopathy: Secondary | ICD-10-CM | POA: Diagnosis not present

## 2020-06-11 LAB — CUP PACEART REMOTE DEVICE CHECK
Date Time Interrogation Session: 20211101171552
Implantable Lead Implant Date: 20180412
Implantable Lead Implant Date: 20180412
Implantable Lead Location: 753859
Implantable Lead Location: 753860
Implantable Lead Model: 377
Implantable Lead Model: 402266
Implantable Lead Serial Number: 4970639
Implantable Lead Serial Number: 49730543
Implantable Pulse Generator Implant Date: 20180412
Pulse Gen Model: 404623
Pulse Gen Serial Number: 60967865

## 2020-06-13 NOTE — Progress Notes (Signed)
Remote ICD transmission.   

## 2020-07-17 ENCOUNTER — Ambulatory Visit: Payer: Medicare Other | Admitting: Cardiology

## 2020-08-12 ENCOUNTER — Other Ambulatory Visit: Payer: Self-pay | Admitting: Cardiology

## 2020-09-05 ENCOUNTER — Ambulatory Visit: Payer: Medicare Other | Admitting: Cardiology

## 2020-09-05 NOTE — Progress Notes (Deleted)
Cardiology Office Note:    Date:  09/05/2020   ID:  Lori Rivera, DOB [birth year], MRN [id number]  PCP:  Garwin Brothers, MD  Cardiologist:  Shirlee More, MD    Referring MD: Garwin Brothers, MD    ASSESSMENT:    No diagnosis found. PLAN:    In order of problems listed above:  1. ***   Next appointment: ***   Medication Adjustments/Labs and Tests Ordered: Current medicines are reviewed at length with the patient today.  Concerns regarding medicines are outlined above.  No orders of the defined types were placed in this encounter.  No orders of the defined types were placed in this encounter.   No chief complaint on file.   History of Present Illness:    Lori Rivera is a 75 y.o. female with a hx of coronary artery disease multiple PCI's heart failure last EF 45% hypertension dyslipidemia and type 2 diabetes.She has had atherectomy and stenting of the proximal LAD and left main with Impella support Dr. Daneen Schick 04/08/2019 with ejection fraction 04/01/2019 of 35 to 40%.  Her last known VF episode May 2021 and she is also had problems with severe symptomatic orthostatic hypotension due to diabetes neuropathy.  She has stage IV CKD.  She was last seen 02/23/2020. Compliance with diet, lifestyle and medications: ***  Last device download 06/11/2020 showed normal lead function parameters and no episodes of VT and VF. Past Medical History:  Diagnosis Date  . Aortic regurgitation 09/24/2017  . Arthritis 09/24/2017  . CAD in native artery 03/07/2015   Overview:  1. Out of hospital cardiac arrest Aug 2016 with MI, shock, PCI and 2 Xience DES to mid and distal culprit RCA with diffuse LAD disease and hypothermia protocol 2. NonSTEMI,05/27/15  Conclusions: 1. Pt was turned down by CTS due to co-morbidities. Pt is for PCI to multiple lesions on LAD and RCA. Ramus is small, will treat medically. 2. Successful PCI / Xience Drug Eluting Stent of the m  . Chest pain 08/08/2015  . CHF  (congestive heart failure) ([place]) 09/24/2017  . Chronic combined systolic and diastolic heart failure ([place]) 08/19/2015   Overview:  Echo 08/03/15: Left Ventricle Normal LV chamber size. Normal LV wall thickness. Mildly reduced overall LV systolic function. Estimated LVEF 45% Inferior, posterior and inferoseptal hypokinesis. Abnormal LV diastolic function, Grade 2, with echo evidence of elevated LA pressure.  Overview:  MUGA 20% 09/03/16 Overview:  Echo 08/03/15: Left Ventricle Normal LV chamber size. Normal LV wall   . Chronic systolic heart failure ([place]) 06/07/2015  . CKD (chronic kidney disease) stage 3, GFR 30-59 ml/min 06/07/2015  . Coronary artery disease involving native heart with angina pectoris ([place]) 06/29/2017   Overview:  Added automatically from request for surgery [phone]  . 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 Essential hypertension 06/29/2017  . Hyperlipidemia 03/07/2015  . 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 Ischemic cardiomyopathy 11/18/2015   Overview:  EF 20-25%  . NSTEMI (non-ST elevated myocardial infarction) ([place]) 05/25/2015   Overview:  Cardiac cath 05/28/15 :Conclusions 1. Pt was turned down by CTS due to co-morbidities. Pt is for PCI to multiple lesions on LAD and RCA. Ramus is small, will treat medically. 2. Successful PCI / Xience Drug Eluting Stent of the mid and distal Left Anterior Descending Coronary Artery. 3. Successful PCI / Xience Drug Eluting Stent of the ostial and mid Right Coronary Artery.  4. POBA to Di  . PNA (pneumonia) 08/08/2015  . Pneumonia due to infectious organism 08/19/2015   Overview:  Discharge summary 08/11/15: Her chest x-ray showed airspace opacities concerning for multifocal pneumonia and she will be discharged on oral Levaquin. Follow-up chest x-ray is recommended to ensure resolution.  . Shortness of breath 09/24/2017  . ST elevation myocardial  infarction involving right coronary artery ([place]) 08/03/2015   Overview:  Due to ISR and acute stent thrombosis Cath 08/03/15: Diagnostic procedure: Left Heart Cath PCI procedure: Stent DES, Thrombectomy Complications: No Complications. Conclusions Diagnostic Summary Acute occlusion of RCA ostial stent thrombosis Severe stenosis of the mid RCA, partially ISR Patent LAD stent CTO of small circ system Interventional Summary Successful Thrombectomy/PTCA of the o  . STEMI (ST elevation myocardial infarction) ([place]) 08/03/2015  . Type 2 diabetes mellitus, without long-term current use of insulin ([place]) 03/07/2015  . Vitamin B 12 deficiency 09/24/2017  . Vitamin D deficiency 09/24/2017    Past Surgical History:  Procedure Laterality Date  . C-spine surgery    . CATARACT EXTRACTION, BILATERAL    . CHOLECYSTECTOMY    . CORONARY ANGIOPLASTY WITH STENT PLACEMENT    . CORONARY ATHERECTOMY N/A 04/08/2019   Procedure: CORONARY ATHERECTOMY;  Surgeon: Sherren Mocha, MD;  Location: [facility];  Service: Cardiovascular;  Laterality: N/A;  . CORONARY STENT INTERVENTION W/IMPELLA N/A 04/08/2019   Procedure: Coronary Stent Intervention w/Impella;  Surgeon: Sherren Mocha, MD;  Location: [facility];  Service: Cardiovascular;  Laterality: N/A;  . INSERT / REPLACE / REMOVE PACEMAKER     ICD, Medtronic  . LEFT HEART CATH AND CORONARY ANGIOGRAPHY N/A 03/31/2019   Procedure: LEFT HEART CATH AND CORONARY ANGIOGRAPHY;  Surgeon: Belva Crome, MD;  Location: [facility];  Service: Cardiovascular;  Laterality: N/A;  . LITHOTRIPSY    . URETEROSCOPY      Current Medications: No outpatient medications have been marked as taking for the 09/05/20 encounter (Appointment) with [name], MD.     Allergies:   Codeine and Penicillins   Social History   Socioeconomic History  . Marital status: Widowed    Spouse name: Not on file  . Number of children: Not on file  . Years of education: Not on  file  . Highest education level: Not on file  Occupational History  . Not on file  Tobacco Use  . Smoking status: Former Smoker    Quit date: 06/07/1985    Years since quitting: 35.2  . Smokeless tobacco: Never Used  Vaping Use  . Vaping Use: Never used  Substance and Sexual Activity  . Alcohol use: No  . Drug use: No  . Sexual activity: Not on file  Other Topics Concern  . Not on file  Social History Narrative  . Not on file   Social Determinants of Health   Financial Resource Strain: Not on file  Food Insecurity: Not on file  Transportation Needs: Not on file  Physical Activity: Not on file  Stress: Not on file  Social Connections: Not on file     Family History: The patient's ***family history includes CAD in her brother and brother; Cancer in her father and sister; Diabetes in her mother; Hypertension in her mother; Stroke in her mother. ROS:   Please see the history of present illness.    All other systems reviewed and are negative.  EKGs/Labs/Other Studies Reviewed:    The following studies were reviewed today:  EKG:  EKG ordered today and personally reviewed.  The ekg ordered today demonstrates ***  Recent Labs: 01/20/2020: BUN 46; Creatinine, Ser 1.99; Magnesium 2.2; Potassium 3.9; Sodium 141  Recent Lipid Panel    Component Value Date/Time   CHOL 125 03/31/2019 0750   TRIG 64 04/08/2019 0222   HDL 42 03/31/2019 0750   CHOLHDL 3.0 03/31/2019 0750   VLDL 13 03/31/2019 0750   LDLCALC 70 03/31/2019 0750    Physical Exam:    VS:  There were no vitals taken for this visit.    Wt Readings from Last 3 Encounters:  02/23/20 191 lb 9.6 oz (86.9 kg)  01/20/20 188 lb 9.6 oz (85.5 kg)  04/29/19 196 lb (88.9 kg)     GEN: *** Well nourished, well developed in no acute distress HEENT: Normal NECK: No JVD; No carotid bruits LYMPHATICS: No lymphadenopathy CARDIAC: ***RRR, no murmurs, rubs, gallops RESPIRATORY:  Clear to auscultation without rales, wheezing  or rhonchi  ABDOMEN: Soft, non-tender, non-distended MUSCULOSKELETAL:  No edema; No deformity  SKIN: Warm and dry NEUROLOGIC:  Alert and oriented x 3 PSYCHIATRIC:  Normal affect    Signed, Shirlee More, MD  09/05/2020 11:12 AM    [place]

## 2020-09-09 LAB — CUP PACEART REMOTE DEVICE CHECK
Date Time Interrogation Session: 20220130080233
Implantable Lead Implant Date: 20180412
Implantable Lead Implant Date: 20180412
Implantable Lead Location: 753859
Implantable Lead Location: 753860
Implantable Lead Model: 377
Implantable Lead Model: 402266
Implantable Lead Serial Number: 4970639
Implantable Lead Serial Number: 49730543
Implantable Pulse Generator Implant Date: 20180412
Pulse Gen Model: 404623
Pulse Gen Serial Number: 60967865

## 2020-09-10 ENCOUNTER — Ambulatory Visit (INDEPENDENT_AMBULATORY_CARE_PROVIDER_SITE_OTHER): Payer: Medicare Other

## 2020-09-10 DIAGNOSIS — I255 Ischemic cardiomyopathy: Secondary | ICD-10-CM

## 2020-09-18 NOTE — Progress Notes (Signed)
Remote ICD transmission.   

## 2020-09-21 ENCOUNTER — Ambulatory Visit (INDEPENDENT_AMBULATORY_CARE_PROVIDER_SITE_OTHER): Payer: Medicare Other | Admitting: Cardiology

## 2020-09-21 ENCOUNTER — Encounter: Payer: Self-pay | Admitting: Cardiology

## 2020-09-21 ENCOUNTER — Other Ambulatory Visit: Payer: Self-pay

## 2020-09-21 VITALS — BP 104/60 | HR 71 | Ht 69.0 in | Wt 203.6 lb

## 2020-09-21 DIAGNOSIS — E1165 Type 2 diabetes mellitus with hyperglycemia: Secondary | ICD-10-CM

## 2020-09-21 DIAGNOSIS — E782 Mixed hyperlipidemia: Secondary | ICD-10-CM | POA: Diagnosis not present

## 2020-09-21 DIAGNOSIS — I255 Ischemic cardiomyopathy: Secondary | ICD-10-CM

## 2020-09-21 DIAGNOSIS — I2510 Atherosclerotic heart disease of native coronary artery without angina pectoris: Secondary | ICD-10-CM | POA: Diagnosis not present

## 2020-09-21 DIAGNOSIS — R0602 Shortness of breath: Secondary | ICD-10-CM

## 2020-09-21 DIAGNOSIS — I10 Essential (primary) hypertension: Secondary | ICD-10-CM

## 2020-09-21 MED ORDER — CLOPIDOGREL BISULFATE 75 MG PO TABS: 75.0000 mg | ORAL_TABLET | Freq: Every day | ORAL | 3 refills | Status: DC

## 2020-09-21 NOTE — Patient Instructions (Addendum)
Medication Instructions:  Your physician has recommended you make the following change in your medication STOP: Brillinta START: Plavix 75 mg take one tablet by mouth daily.  *If you need a refill on your cardiac medications before your next appointment, please call your pharmacy*   Lab Work: Your physician recommends that you return for lab work in: TODAY CMP, Lipids, ProBNP, HGB A1C If you have labs (blood work) drawn today and your tests are completely normal, you will receive your results only by: Marland Kitchen MyChart Message (if you have MyChart) OR . A paper copy in the mail If you have any lab test that is abnormal or we need to change your treatment, we will call you to review the results.   Testing/Procedures: None   Follow-Up: At [facility], you and your health needs are our priority.  As part of our continuing mission to provide you with exceptional heart care, we have created designated Provider Care Teams.  These Care Teams include your primary Cardiologist (physician) and Advanced Practice Providers (APPs -  Physician Assistants and Nurse Practitioners) who all work together to provide you with the care you need, when you need it.  We recommend signing up for the patient portal called "MyChart".  Sign up information is provided on this After Visit Summary.  MyChart is used to connect with patients for Virtual Visits (Telemedicine).  Patients are able to view lab/test results, encounter notes, upcoming appointments, etc.  Non-urgent messages can be sent to your provider as well.   To learn more about what you can do with MyChart, go to [email or web address].    Your next appointment:   6 month(s)  The format for your next appointment:   In Person  Provider:   Shirlee More, MD   Other Instructions

## 2020-09-21 NOTE — Addendum Note (Signed)
Addended by: Resa Miner I on: 09/21/2020 02:39 PM   Modules accepted: Orders

## 2020-09-21 NOTE — Progress Notes (Signed)
Cardiology Office Note:    Date:  09/21/2020   ID:  Lori Rivera, DOB 10/03/1945, MRN [id number]  PCP:  Garwin Brothers, MD  Cardiologist:  Shirlee More, MD    Referring MD: Garwin Brothers, MD    ASSESSMENT:    1. CAD in native artery   2. Essential hypertension   3. Ischemic cardiomyopathy   4. Mixed hyperlipidemia   5. Type 2 diabetes mellitus with hyperglycemia (HCC)     PLAN:    In order of problems listed above:  1. Overall Nashla is doing quite well with close attention supervision by her daughter.  She will continue dual antiplatelet therapy with transition from Brilinta to clopidogrel more than a year from her last PCI along with her high intensity statin and beta-blocker. 2. BP at target continue current treatment 3. Stable heart failure compensated continue her loop diuretic and is not on ACE ARB or Entresto with previous severe orthostatic hypotension due to her diabetic neuropathy 4. Continue statin check lipid profile liver function 5. Managed by her PCP continue current treatment which is cardioprotective I will draw an A1c and will send a copy to their office   Next appointment: 6 months   Medication Adjustments/Labs and Tests Ordered: Current medicines are reviewed at length with the patient today.  Concerns regarding medicines are outlined above.  Orders Placed This Encounter  Procedures  . Comprehensive metabolic panel  . Lipid panel  . Pro b natriuretic peptide (BNP)  . HgB A1c  . EKG 12-Lead   Meds ordered this encounter  Medications  . clopidogrel (PLAVIX) 75 MG tablet    Sig: Take 1 tablet (75 mg total) by mouth daily.    Dispense:  90 tablet    Refill:  3    Chief Complaint  Patient presents with  . Follow-up  . Coronary Artery Disease    History of Present Illness:    Lori Rivera is a 75 y.o. female with a hx of CAD multiple PCI's heart failure last ejection fraction moderately diminished hypertension dyslipidemia type 2 diabetes and  orthostatic hypotension.  She was last seen 02/23/2020.  She has an ICD also.  Check 09/09/2020 showed normal device parameters and function.  Her last ejection fraction 35 to 40% 04/01/2019 not on ACE or ARB because of her previous severe symptomatic hypotension. Compliance with diet, lifestyle and medications: Yes  Her daughter is present she supervises care is involved in evaluation decision making. She is taking her diuretic daily 40 mg of 3 days a week she takes a second tablet in the afternoon.  Her weight is stable no edema shortness of breath or orthopnea.  Her last labs were performed in September her creatinine was 2.0 and today we will check full labs including CMP and lipid profile proBNP.  She has had no ICD therapy follows in our device clinic normal parameters no chest pain orthopnea palpitation or syncope.  She remains on dual antiplatelet therapy with Brilinta more than a year from PCI and she will transition to aspirin and clopidogrel. Past Medical History:  Diagnosis Date  . Aortic regurgitation 09/24/2017  . Arthritis 09/24/2017  . CAD in native artery 03/07/2015   Overview:  1. Out of hospital cardiac arrest Aug 2016 with MI, shock, PCI and 2 Xience DES to mid and distal culprit RCA with diffuse LAD disease and hypothermia protocol 2. NonSTEMI,05/27/15  Conclusions: 1. Pt was turned down by CTS due to co-morbidities. Pt is for PCI to  multiple lesions on LAD and RCA. Ramus is small, will treat medically. 2. Successful PCI / Xience Drug Eluting Stent of the m  . Chest pain 08/08/2015  . CHF (congestive heart failure) ([place]) 09/24/2017  . Chronic combined systolic and diastolic heart failure ([place]) 08/19/2015   Overview:  Echo 08/03/15: Left Ventricle Normal LV chamber size. Normal LV wall thickness. Mildly reduced overall LV systolic function. Estimated LVEF 45% Inferior, posterior and inferoseptal hypokinesis. Abnormal LV diastolic function, Grade 2, with echo evidence of elevated LA  pressure.  Overview:  MUGA 20% 09/03/16 Overview:  Echo 08/03/15: Left Ventricle Normal LV chamber size. Normal LV wall   . Chronic systolic heart failure ([place]) 06/07/2015  . CKD (chronic kidney disease) stage 3, GFR 30-59 ml/min (HCC) 06/07/2015  . Coronary artery disease involving native heart with angina pectoris ([place]) 06/29/2017   Overview:  Added automatically from request for surgery [phone]  . 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 Essential hypertension 06/29/2017  . Hyperlipidemia 03/07/2015  . 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 Ischemic cardiomyopathy 11/18/2015   Overview:  EF 20-25%  . NSTEMI (non-ST elevated myocardial infarction) ([place]) 05/25/2015   Overview:  Cardiac cath 05/28/15 :Conclusions 1. Pt was turned down by CTS due to co-morbidities. Pt is for PCI to multiple lesions on LAD and RCA. Ramus is small, will treat medically. 2. Successful PCI / Xience Drug Eluting Stent of the mid and distal Left Anterior Descending Coronary Artery. 3. Successful PCI / Xience Drug Eluting Stent of the ostial and mid Right Coronary Artery. 4. POBA to Di  . PNA (pneumonia) 08/08/2015  . Pneumonia due to infectious organism 08/19/2015   Overview:  Discharge summary 08/11/15: Her chest x-ray showed airspace opacities concerning for multifocal pneumonia and she will be discharged on oral Levaquin. Follow-up chest x-ray is recommended to ensure resolution.  . Shortness of breath 09/24/2017  . ST elevation myocardial infarction involving right coronary artery ([place]) 08/03/2015   Overview:  Due to ISR and acute stent thrombosis Cath 08/03/15: Diagnostic procedure: Left Heart Cath PCI procedure: Stent DES, Thrombectomy Complications: No Complications. Conclusions Diagnostic Summary Acute occlusion of RCA ostial stent thrombosis Severe stenosis of the mid RCA, partially ISR Patent LAD stent CTO of  small circ system Interventional Summary Successful Thrombectomy/PTCA of the o  . STEMI (ST elevation myocardial infarction) ([place]) 08/03/2015  . Type 2 diabetes mellitus, without long-term current use of insulin ([place]) 03/07/2015  . Vitamin B 12 deficiency 09/24/2017  . Vitamin D deficiency 09/24/2017    Past Surgical History:  Procedure Laterality Date  . C-spine surgery    . CATARACT EXTRACTION, BILATERAL    . CHOLECYSTECTOMY    . CORONARY ANGIOPLASTY WITH STENT PLACEMENT    . CORONARY ATHERECTOMY N/A 04/08/2019   Procedure: CORONARY ATHERECTOMY;  Surgeon: Sherren Mocha, MD;  Location: [facility];  Service: Cardiovascular;  Laterality: N/A;  . CORONARY STENT INTERVENTION W/IMPELLA N/A 04/08/2019   Procedure: Coronary Stent Intervention w/Impella;  Surgeon: Sherren Mocha, MD;  Location: [facility];  Service: Cardiovascular;  Laterality: N/A;  . INSERT / REPLACE / REMOVE PACEMAKER     ICD, Medtronic  . LEFT HEART CATH AND CORONARY ANGIOGRAPHY N/A 03/31/2019   Procedure: LEFT HEART CATH AND CORONARY ANGIOGRAPHY;  Surgeon: Belva Crome, MD;  Location: [facility];  Service: Cardiovascular;  Laterality: N/A;  . LITHOTRIPSY    .  URETEROSCOPY      Current Medications: Current Meds  Medication Sig  . aspirin EC 81 MG tablet Take 81 mg by mouth daily.   Marland Kitchen atorvastatin (LIPITOR) 40 MG tablet Take 1 tablet (40 mg total) by mouth daily at 6 PM.  . BD PEN NEEDLE NANO 2ND GEN 32G X 4 MM MISC   . clopidogrel (PLAVIX) 75 MG tablet Take 1 tablet (75 mg total) by mouth daily.  . furosemide (LASIX) 40 MG tablet Take 20-40 mg by mouth 2 (two) times daily. Take 40 mg (one tablet) in the morning and 20 mg (half tablet) in the evening.  . isosorbide mononitrate (IMDUR) 60 MG 24 hr tablet Take 1 tablet (60 mg total) by mouth daily.  Marland Kitchen JARDIANCE 25 MG TABS tablet Take 25 mg by mouth every morning.  . metoprolol tartrate (LOPRESSOR) 100 MG tablet TAKE 1 TABLET BY MOUTH TWICE A DAY   . nitroGLYCERIN (NITROSTAT) 0.4 MG SL tablet Place 0.4 mg under the tongue every 5 (five) minutes as needed.   Tyler Aas FLEXTOUCH 100 UNIT/ML SOPN FlexTouch Pen Inject 20 Units into the skin daily.   . TRULICITY 3 PJ/8.2NK SOPN once a week.  . [DISCONTINUED] ticagrelor (BRILINTA) 90 MG TABS tablet Take 1 tablet (90 mg total) by mouth 2 (two) times daily.     Allergies:   Codeine and Penicillins   Social History   Socioeconomic History  . Marital status: Widowed    Spouse name: Not on file  . Number of children: Not on file  . Years of education: Not on file  . Highest education level: Not on file  Occupational History  . Not on file  Tobacco Use  . Smoking status: Former Smoker    Quit date: 06/07/1985    Years since quitting: 35.3  . Smokeless tobacco: Never Used  Vaping Use  . Vaping Use: Never used  Substance and Sexual Activity  . Alcohol use: No  . Drug use: No  . Sexual activity: Not on file  Other Topics Concern  . Not on file  Social History Narrative  . Not on file   Social Determinants of Health   Financial Resource Strain: Not on file  Food Insecurity: Not on file  Transportation Needs: Not on file  Physical Activity: Not on file  Stress: Not on file  Social Connections: Not on file     Family History: The patient's family history includes CAD in her brother and brother; Cancer in her father and sister; Diabetes in her mother; Hypertension in her mother; Stroke in her mother. ROS:   Please see the history of present illness.    All other systems reviewed and are negative.  EKGs/Labs/Other Studies Reviewed:    The following studies were reviewed today:  EKG:  EKG ordered today and personally reviewed.  The ekg ordered today demonstrates sinus rhythm atrially paced old inferior and apical MI.  Recent Labs: 01/20/2020: BUN 46; Creatinine, Ser 1.99; Magnesium 2.2; Potassium 3.9; Sodium 141  Recent Lipid Panel    Component Value Date/Time   CHOL  125 03/31/2019 0750   TRIG 64 04/08/2019 0222   HDL 42 03/31/2019 0750   CHOLHDL 3.0 03/31/2019 0750   VLDL 13 03/31/2019 0750   LDLCALC 70 03/31/2019 0750    Physical Exam:    VS:  BP 104/60   Pulse 71   Ht 5\' 9"  (1.753 m)   Wt 203 lb 9.6 oz (92.4 kg)   SpO2 97%  BMI 30.07 kg/m     Wt Readings from Last 3 Encounters:  09/21/20 203 lb 9.6 oz (92.4 kg)  02/23/20 191 lb 9.6 oz (86.9 kg)  01/20/20 188 lb 9.6 oz (85.5 kg)     GEN: She looks frail well nourished, well developed in no acute distress HEENT: Normal NECK: No JVD; No carotid bruits LYMPHATICS: No lymphadenopathy CARDIAC: RRR, no murmurs, rubs, gallops RESPIRATORY:  Clear to auscultation without rales, wheezing or rhonchi  ABDOMEN: Soft, non-tender, non-distended MUSCULOSKELETAL:  No edema; No deformity  SKIN: Warm and dry NEUROLOGIC:  Alert and oriented x 3 PSYCHIATRIC:  Normal affect    Signed, Shirlee More, MD  09/21/2020 2:32 PM    [facility]

## 2020-09-22 LAB — LIPID PANEL
Chol/HDL Ratio: 2.8 ratio (ref 0.0–4.4)
Cholesterol, Total: 103 mg/dL (ref 100–199)
HDL: 37 mg/dL — ABNORMAL LOW (ref >39)
LDL Chol Calc (NIH): 46 mg/dL (ref 0–99)
Triglycerides: 106 mg/dL (ref 0–149)
VLDL Cholesterol Cal: 20 mg/dL (ref 5–40)

## 2020-09-22 LAB — COMPREHENSIVE METABOLIC PANEL
ALT: 12 IU/L (ref 0–32)
AST: 15 IU/L (ref 0–40)
Albumin/Globulin Ratio: 1.4 (ref 1.2–2.2)
Albumin: 3.8 g/dL (ref 3.7–4.7)
Alkaline Phosphatase: 126 IU/L — ABNORMAL HIGH (ref 44–121)
BUN/Creatinine Ratio: 21 (ref 12–28)
BUN: 46 mg/dL — ABNORMAL HIGH (ref 8–27)
Bilirubin Total: 0.5 mg/dL (ref 0.0–1.2)
CO2: 21 mmol/L (ref 20–29)
Calcium: 8.5 mg/dL — ABNORMAL LOW (ref 8.7–10.3)
Chloride: 98 mmol/L (ref 96–106)
Creatinine, Ser: 2.16 mg/dL — ABNORMAL HIGH (ref 0.57–1.00)
GFR calc Af Amer: 25 mL/min/{1.73_m2} — ABNORMAL LOW (ref >59)
GFR calc non Af Amer: 22 mL/min/{1.73_m2} — ABNORMAL LOW (ref >59)
Globulin, Total: 2.8 g/dL (ref 1.5–4.5)
Glucose: 383 mg/dL — ABNORMAL HIGH (ref 65–99)
Potassium: 3.6 mmol/L (ref 3.5–5.2)
Sodium: 141 mmol/L (ref 134–144)
Total Protein: 6.6 g/dL (ref 6.0–8.5)

## 2020-09-22 LAB — PRO B NATRIURETIC PEPTIDE: NT-Pro BNP: 1435 pg/mL — ABNORMAL HIGH (ref 0–738)

## 2020-09-22 LAB — HEMOGLOBIN A1C
Est. average glucose Bld gHb Est-mCnc: 229 mg/dL
Hgb A1c MFr Bld: 9.6 % — ABNORMAL HIGH (ref 4.8–5.6)

## 2020-10-24 ENCOUNTER — Other Ambulatory Visit: Payer: Self-pay | Admitting: Sports Medicine

## 2020-10-24 ENCOUNTER — Other Ambulatory Visit: Payer: Self-pay

## 2020-10-24 ENCOUNTER — Ambulatory Visit (INDEPENDENT_AMBULATORY_CARE_PROVIDER_SITE_OTHER): Payer: Medicare Other | Admitting: Sports Medicine

## 2020-10-24 ENCOUNTER — Encounter: Payer: Self-pay | Admitting: Sports Medicine

## 2020-10-24 DIAGNOSIS — M79672 Pain in left foot: Secondary | ICD-10-CM

## 2020-10-24 DIAGNOSIS — E1142 Type 2 diabetes mellitus with diabetic polyneuropathy: Secondary | ICD-10-CM

## 2020-10-24 DIAGNOSIS — I739 Peripheral vascular disease, unspecified: Secondary | ICD-10-CM

## 2020-10-24 DIAGNOSIS — Q828 Other specified congenital malformations of skin: Secondary | ICD-10-CM | POA: Diagnosis not present

## 2020-10-24 NOTE — Progress Notes (Signed)
Subjective: Lori Rivera is a 75 y.o. female patient who presents to office for evaluation of left foot pain reports that there is a hard spot on the bottom of the heel that she knows about 3 weeks ago reports that she started soaking with Epson salt that helped and now pain is less 2 out of 10 states that also she has been having a lot of swelling on fluid pills.  Fasting blood sugar yesterday 259 last A1c unknown and last visit to PCP unknown.  Patient Active Problem List   Diagnosis Date Noted  . Stage III pressure ulcer of sacral region ([place]) 04/12/2019  . Acute respiratory failure with hypoxia ([place]) 04/07/2019  . Cardiogenic pulmonary edema ([place])   . Unstable angina ([place]) 03/31/2019  . Pressure injury of skin 03/31/2019  . Palpitations 01/17/2019  . PAT (paroxysmal atrial tachycardia) ([place]) 01/17/2019  . Acute renal failure superimposed on stage 3 chronic kidney disease ([place]) 11/27/2017  . Arthritis 09/24/2017  . CHF (congestive heart failure) ([place]) 09/24/2017  . Shortness of breath 09/24/2017  . Aortic regurgitation 09/24/2017  . Vitamin B 12 deficiency 09/24/2017  . Vitamin D deficiency 09/24/2017  . Essential hypertension 06/29/2017  . Coronary artery disease involving native heart with angina pectoris ([place]) 06/29/2017  . 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 Ischemic cardiomyopathy 11/18/2015  . Chronic combined systolic and diastolic heart failure ([place]) 08/19/2015  . Pneumonia due to infectious organism 08/19/2015  . Chest pain 08/08/2015  . PNA (pneumonia) 08/08/2015  . ST elevation myocardial infarction involving right coronary artery ([place]) 08/03/2015  . STEMI (ST elevation myocardial infarction) ([place]) 08/03/2015  . Acute on chronic systolic heart failure ([place]) 06/07/2015  . CKD (chronic kidney disease) stage 3, GFR 30-59 ml/min (HCC) 06/07/2015  . NSTEMI (non-ST elevated myocardial infarction) ([place]) 05/25/2015  . CAD in native artery  03/07/2015  . Hyperlipidemia 03/07/2015  . 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 Type 2 diabetes mellitus, without long-term current use of insulin ([place]) 03/07/2015    Current Outpatient Medications on File Prior to Visit  Medication Sig Dispense Refill  . aspirin EC 81 MG tablet Take 81 mg by mouth daily.     Marland Kitchen atorvastatin (LIPITOR) 40 MG tablet Take 1 tablet (40 mg total) by mouth daily at 6 PM. 30 tablet 0  . BD PEN NEEDLE NANO 2ND GEN 32G X 4 MM MISC     . clopidogrel (PLAVIX) 75 MG tablet Take 1 tablet (75 mg total) by mouth daily. 90 tablet 3  . furosemide (LASIX) 40 MG tablet Take 20-40 mg by mouth 2 (two) times daily. Take 40 mg (one tablet) in the morning and 20 mg (half tablet) in the evening.    . isosorbide mononitrate (IMDUR) 60 MG 24 hr tablet Take 1 tablet (60 mg total) by mouth daily. 30 tablet 0  . JARDIANCE 25 MG TABS tablet Take 25 mg by mouth every morning.    . metoprolol tartrate (LOPRESSOR) 100 MG tablet TAKE 1 TABLET BY MOUTH TWICE A DAY 180 tablet 1  . nitroGLYCERIN (NITROSTAT) 0.4 MG SL tablet Place 0.4 mg under the tongue every 5 (five) minutes as needed.     Tyler Aas FLEXTOUCH 100 UNIT/ML SOPN FlexTouch Pen Inject 20 Units into the skin daily.     . TRULICITY 3 JH/4.1DE SOPN once a week.     No current facility-administered medications on file prior to visit.    Allergies  Allergen Reactions  . Codeine Other (See Comments)    confused  . Penicillins Rash    Did it involve swelling of the face/tongue/throat, SOB, or low BP? Yes Did it involve sudden or severe rash/hives, skin peeling, or any reaction on the inside of your mouth or nose? No Did you need to seek medical attention at a hospital or doctor's office? Yes When did it last happen?20 yrs ago If all above answers are "NO", may proceed with cephalosporin use.     Objective:  General: Alert and oriented x3 in no acute  distress  Dermatology: Keratotic lesion present plantar left heel with skin lines transversing the lesion, pain is present with direct pressure to the lesion with a central nucleated core noted, no webspace macerations, no ecchymosis bilateral, all nails x 10 are mildly elongated and thickened consistent with onychomycosis.  Vascular: Dorsalis Pedis 1 out of 4 and Posterior Tibial pedal pulses 0 out of 4 due to edema, capillary Fill Time 3 seconds, + pedal hair growth bilateral, 1+ pitting edema bilateral lower extremities, Temperature gradient within normal limits.  Neurology: Johney Maine sensation intact via light touch bilateral.  Musculoskeletal: Mild tenderness with palpation at the keratotic lesion site on left asymptomatic pes planus and hammertoe deformity noted.  Assessment and Plan: Problem List Items Addressed This Visit   None   Visit Diagnoses    Porokeratosis    -  Primary   Diabetic polyneuropathy associated with type 2 diabetes mellitus ([place])       PVD (peripheral vascular disease) ([place])          -Complete examination performed -Discussed treatment options -Parred keratoic lesion using a chisel blade x1 at left heel; treated the area withSalinocaine covered with Band-Aid -Encouraged daily skin emollients gave sample of foot miracle cream -Advised good supportive shoes and use of heel cushion is provided -Advised patient to return to office in 3 months for routine nail care  Landis Martins, DPM

## 2020-12-10 ENCOUNTER — Ambulatory Visit (INDEPENDENT_AMBULATORY_CARE_PROVIDER_SITE_OTHER): Payer: Medicare Other

## 2020-12-10 DIAGNOSIS — I255 Ischemic cardiomyopathy: Secondary | ICD-10-CM

## 2020-12-14 LAB — CUP PACEART REMOTE DEVICE CHECK
Date Time Interrogation Session: 20220502112354
Implantable Lead Implant Date: 20180412
Implantable Lead Implant Date: 20180412
Implantable Lead Location: 753859
Implantable Lead Location: 753860
Implantable Lead Model: 377
Implantable Lead Model: 402266
Implantable Lead Serial Number: 4970639
Implantable Lead Serial Number: 49730543
Implantable Pulse Generator Implant Date: 20180412
Pulse Gen Model: 404623
Pulse Gen Serial Number: 60967865

## 2020-12-28 NOTE — Progress Notes (Signed)
Remote ICD transmission.   

## 2021-01-25 ENCOUNTER — Ambulatory Visit: Payer: Medicare Other | Admitting: Sports Medicine

## 2021-01-31 DIAGNOSIS — I255 Ischemic cardiomyopathy: Secondary | ICD-10-CM

## 2021-01-31 DIAGNOSIS — I361 Nonrheumatic tricuspid (valve) insufficiency: Secondary | ICD-10-CM | POA: Diagnosis not present

## 2021-01-31 DIAGNOSIS — I4891 Unspecified atrial fibrillation: Secondary | ICD-10-CM | POA: Diagnosis not present

## 2021-01-31 DIAGNOSIS — I5022 Chronic systolic (congestive) heart failure: Secondary | ICD-10-CM

## 2021-01-31 DIAGNOSIS — E119 Type 2 diabetes mellitus without complications: Secondary | ICD-10-CM

## 2021-01-31 DIAGNOSIS — I2510 Atherosclerotic heart disease of native coronary artery without angina pectoris: Secondary | ICD-10-CM

## 2021-01-31 DIAGNOSIS — N189 Chronic kidney disease, unspecified: Secondary | ICD-10-CM

## 2021-01-31 DIAGNOSIS — I340 Nonrheumatic mitral (valve) insufficiency: Secondary | ICD-10-CM

## 2021-01-31 DIAGNOSIS — R0989 Other specified symptoms and signs involving the circulatory and respiratory systems: Secondary | ICD-10-CM

## 2021-02-01 DIAGNOSIS — I4891 Unspecified atrial fibrillation: Secondary | ICD-10-CM | POA: Diagnosis not present

## 2021-02-01 DIAGNOSIS — R0989 Other specified symptoms and signs involving the circulatory and respiratory systems: Secondary | ICD-10-CM | POA: Diagnosis not present

## 2021-02-01 DIAGNOSIS — N189 Chronic kidney disease, unspecified: Secondary | ICD-10-CM | POA: Diagnosis not present

## 2021-02-01 DIAGNOSIS — I255 Ischemic cardiomyopathy: Secondary | ICD-10-CM | POA: Diagnosis not present

## 2021-02-02 DIAGNOSIS — I5033 Acute on chronic diastolic (congestive) heart failure: Secondary | ICD-10-CM | POA: Diagnosis not present

## 2021-02-02 DIAGNOSIS — Z95810 Presence of automatic (implantable) cardiac defibrillator: Secondary | ICD-10-CM

## 2021-02-02 DIAGNOSIS — I480 Paroxysmal atrial fibrillation: Secondary | ICD-10-CM

## 2021-02-02 DIAGNOSIS — I2510 Atherosclerotic heart disease of native coronary artery without angina pectoris: Secondary | ICD-10-CM | POA: Diagnosis not present

## 2021-02-03 DIAGNOSIS — Z95810 Presence of automatic (implantable) cardiac defibrillator: Secondary | ICD-10-CM

## 2021-02-03 DIAGNOSIS — I5033 Acute on chronic diastolic (congestive) heart failure: Secondary | ICD-10-CM | POA: Diagnosis not present

## 2021-02-03 DIAGNOSIS — I480 Paroxysmal atrial fibrillation: Secondary | ICD-10-CM | POA: Diagnosis not present

## 2021-02-03 DIAGNOSIS — I2510 Atherosclerotic heart disease of native coronary artery without angina pectoris: Secondary | ICD-10-CM

## 2021-02-04 DIAGNOSIS — I5033 Acute on chronic diastolic (congestive) heart failure: Secondary | ICD-10-CM | POA: Diagnosis not present

## 2021-02-04 DIAGNOSIS — I480 Paroxysmal atrial fibrillation: Secondary | ICD-10-CM | POA: Diagnosis not present

## 2021-02-04 DIAGNOSIS — I2510 Atherosclerotic heart disease of native coronary artery without angina pectoris: Secondary | ICD-10-CM | POA: Diagnosis not present

## 2021-02-04 DIAGNOSIS — Z95810 Presence of automatic (implantable) cardiac defibrillator: Secondary | ICD-10-CM | POA: Diagnosis not present

## 2021-02-05 DIAGNOSIS — I5033 Acute on chronic diastolic (congestive) heart failure: Secondary | ICD-10-CM | POA: Diagnosis not present

## 2021-02-05 DIAGNOSIS — I2510 Atherosclerotic heart disease of native coronary artery without angina pectoris: Secondary | ICD-10-CM | POA: Diagnosis not present

## 2021-02-05 DIAGNOSIS — Z95810 Presence of automatic (implantable) cardiac defibrillator: Secondary | ICD-10-CM | POA: Diagnosis not present

## 2021-02-05 DIAGNOSIS — I480 Paroxysmal atrial fibrillation: Secondary | ICD-10-CM | POA: Diagnosis not present

## 2021-02-19 ENCOUNTER — Telehealth: Payer: Self-pay

## 2021-02-19 NOTE — Telephone Encounter (Signed)
Biotronik alert received for monitor not connected. Unable to leave VM. Other number on file not working.

## 2021-02-20 NOTE — Telephone Encounter (Signed)
I called the patient and her daughter but no answer/ no voicemail.

## 2021-02-22 NOTE — Telephone Encounter (Signed)
LMOVM for patient to return my call.

## 2021-02-22 NOTE — Telephone Encounter (Signed)
Certified letter sent 02-22-2021

## 2021-02-23 ENCOUNTER — Other Ambulatory Visit: Payer: Self-pay | Admitting: Cardiology

## 2021-02-26 ENCOUNTER — Ambulatory Visit: Payer: Medicare Other | Admitting: Sports Medicine

## 2021-04-02 ENCOUNTER — Ambulatory Visit: Payer: Medicare Other | Admitting: Cardiology

## 2021-04-03 ENCOUNTER — Encounter: Payer: Self-pay | Admitting: Cardiology

## 2021-06-10 ENCOUNTER — Ambulatory Visit (INDEPENDENT_AMBULATORY_CARE_PROVIDER_SITE_OTHER): Payer: Medicare Other

## 2021-06-10 DIAGNOSIS — I255 Ischemic cardiomyopathy: Secondary | ICD-10-CM | POA: Diagnosis not present

## 2021-06-10 LAB — CUP PACEART REMOTE DEVICE CHECK
Date Time Interrogation Session: 20221031110731
Implantable Lead Implant Date: 20180412
Implantable Lead Implant Date: 20180412
Implantable Lead Location: 753859
Implantable Lead Location: 753860
Implantable Lead Model: 377
Implantable Lead Model: 402266
Implantable Lead Serial Number: 4970639
Implantable Lead Serial Number: 49730543
Implantable Pulse Generator Implant Date: 20180412
Pulse Gen Model: 404623
Pulse Gen Serial Number: 60967865

## 2021-06-17 NOTE — Progress Notes (Signed)
Remote ICD transmission.   

## 2021-07-07 DIAGNOSIS — I440 Atrioventricular block, first degree: Secondary | ICD-10-CM

## 2021-07-08 DIAGNOSIS — I5040 Unspecified combined systolic (congestive) and diastolic (congestive) heart failure: Secondary | ICD-10-CM

## 2021-07-08 DIAGNOSIS — Z95810 Presence of automatic (implantable) cardiac defibrillator: Secondary | ICD-10-CM

## 2021-07-08 DIAGNOSIS — I951 Orthostatic hypotension: Secondary | ICD-10-CM

## 2021-07-08 DIAGNOSIS — N189 Chronic kidney disease, unspecified: Secondary | ICD-10-CM

## 2021-07-08 DIAGNOSIS — I248 Other forms of acute ischemic heart disease: Secondary | ICD-10-CM

## 2021-07-09 DIAGNOSIS — I248 Other forms of acute ischemic heart disease: Secondary | ICD-10-CM | POA: Diagnosis not present

## 2021-07-09 DIAGNOSIS — Z95810 Presence of automatic (implantable) cardiac defibrillator: Secondary | ICD-10-CM | POA: Diagnosis not present

## 2021-07-09 DIAGNOSIS — I2510 Atherosclerotic heart disease of native coronary artery without angina pectoris: Secondary | ICD-10-CM

## 2021-07-09 DIAGNOSIS — N189 Chronic kidney disease, unspecified: Secondary | ICD-10-CM | POA: Diagnosis not present

## 2021-07-17 ENCOUNTER — Other Ambulatory Visit: Payer: Self-pay | Admitting: *Deleted

## 2021-07-17 NOTE — Patient Outreach (Signed)
[facility]) Care Management  07/17/2021  Lori Rivera Jul 07, 1946 [id number]   Referral Date:12/6 Referral Source: Data Anaylsis Referral Reason: Recent hospital discharge Insurance: Traditional Medicare/DCE   Outreach attempt #1, successful to member.  Identity verified.  This care manager introduced self and stated purpose of call.  [facility] care management services explained.    Although she was able to verify identity, she is unable to verbalize current health condition or reason for hospitalization.  Requested this care manager call daughter, Joelene Millin.  Call placed to daughter, no answer, unable to leave message as phone just rings.    Plan: RN CM will send outreach letter and follow up within the next 3-4 business days.  Valente David, [place], MSN [place] [phone]

## 2021-07-23 ENCOUNTER — Other Ambulatory Visit: Payer: Self-pay | Admitting: *Deleted

## 2021-07-23 NOTE — Patient Outreach (Signed)
[facility]) Care Management  07/23/2021  Lori Rivera 1946-06-08 [id number]   Referral Date:12/6 Referral Source: Data Anaylsis Referral Reason: Recent hospital discharge Insurance: Traditional Medicare/DCE   Outreach attempt #2 to daughter per member's request, no answer to both listed home and mobile numbers, unable to leave voice message.    Plan: RN CM will follow up within the next 3-4 business days.  Valente David, [place], MSN [place] [phone]

## 2021-07-29 ENCOUNTER — Other Ambulatory Visit: Payer: Self-pay | Admitting: *Deleted

## 2021-07-29 NOTE — Patient Outreach (Signed)
[facility]) Care Management  07/29/2021  OPHA MCGHEE 11-08-1945 [id number]   Outreach attempt #3 to daughter per member's request, no answer, unable to leave voice message.  Will follow up within the next 3 weeks.  Will make 4th and final attempt within the next 4 weeks, if remain unsuccessful will close case due to inability to establish contact.  Valente David, [place], MSN [place] [phone]

## 2021-08-07 IMAGING — DX PORTABLE CHEST - 1 VIEW
1 series · 1 of 1 positions shown · non-contrast
Comparison: Earlier radiograph dated 04/06/2019

CLINICAL DATA: 73-year-old female. Evaluate for endotracheal and
enteric tube placement.

EXAM:
PORTABLE CHEST 1 VIEW

[chest ap]
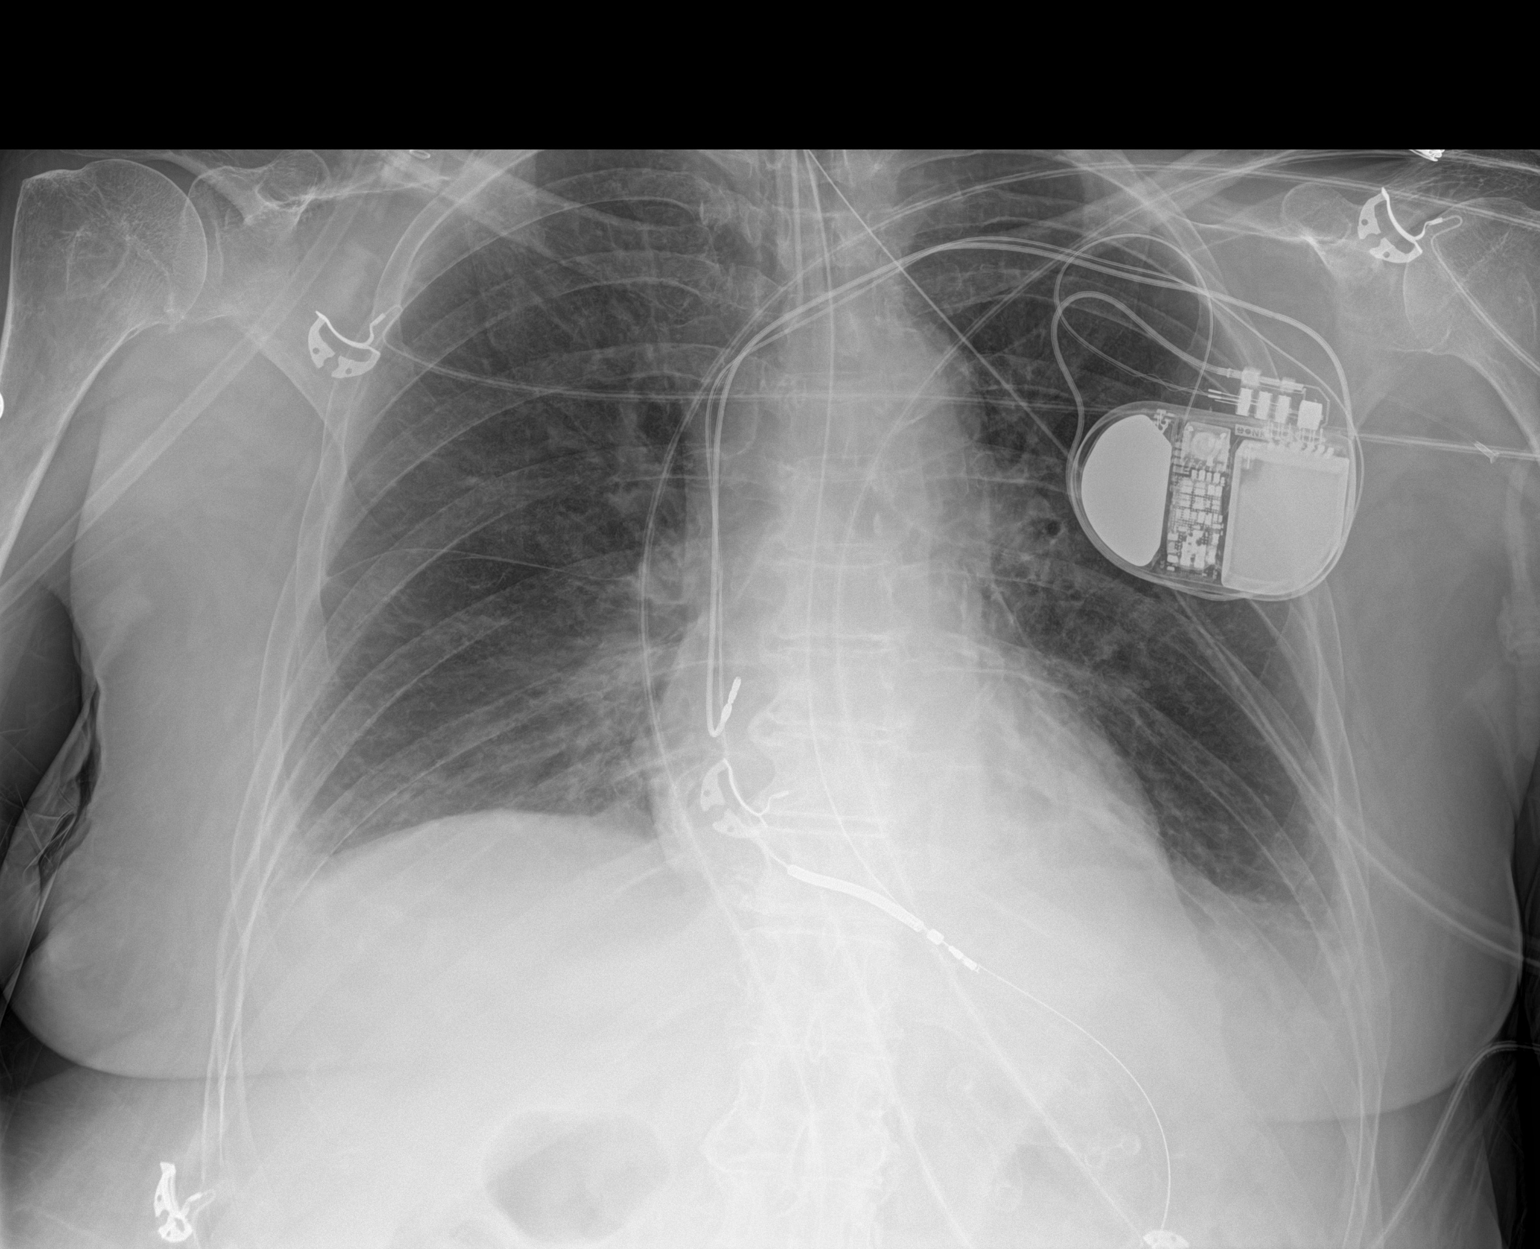

[1 of 1 positions shown; findings below may reference images not displayed]

FINDINGS: Endotracheal tube with tip approximately 4 cm above the carina.
Enteric tube extends below the diaphragm with tip beyond the
inferior margin of the image. Trace bilateral pleural effusions may
be present. There is bibasilar atelectatic changes. Pneumonia is not
excluded. Clinical correlation is recommended. No pneumothorax. The
cardiac silhouette is within normal limits. Coronary vascular
calcification or stent. Left pectoral pacemaker device. No acute
osseous pathology. Partially visualized cervical ACDF.
IMPRESSION: 1. Endotracheal tube above the carina. Enteric tube extends below
the diaphragm with tip beyond the inferior margin of the image.
2. Bibasilar atelectatic changes. Pneumonia is not excluded.

## 2021-08-13 ENCOUNTER — Telehealth: Payer: Self-pay

## 2021-08-13 NOTE — Telephone Encounter (Signed)
Biotronik alert received for Atrial Monitoring episode that lasted approx 47 min on Dec 31 from 12:51 pm to 1:37 pm.  Remote history of 6 hour At/AF episode in 2020. No OAC. + Plavix and Asa for CAD.   Successful telephone encounter to patient to follow up on brief episode of AF with CVR. Patient states she was unaware of AF. She is compliant with her DAPT for CAD. Anticipate watchful waiting however will send to Dr. Curt Bears for review. Patient aware she will be contacted only if new orders are received.

## 2021-08-21 ENCOUNTER — Other Ambulatory Visit: Payer: Self-pay | Admitting: *Deleted

## 2021-08-21 NOTE — Patient Outreach (Signed)
[facility]) Care Management  08/21/2021  Lori Rivera 10/14/1945 [id number]   Outreach attempt #4 to member/daughter, unsuccessful to both listed home and cell numbers.  No response from member after multiple unsuccessful outreach attempts and letter sent.  Will close case at this time due to inability to maintain contact.  Will notify member and primary MD of case closure.  Valente David, RN, MSN, [place] Manager [phone]

## 2021-08-23 ENCOUNTER — Ambulatory Visit: Payer: Self-pay | Admitting: *Deleted

## 2021-09-09 ENCOUNTER — Ambulatory Visit (INDEPENDENT_AMBULATORY_CARE_PROVIDER_SITE_OTHER): Payer: Medicare Other

## 2021-09-09 DIAGNOSIS — I255 Ischemic cardiomyopathy: Secondary | ICD-10-CM

## 2021-09-10 LAB — CUP PACEART REMOTE DEVICE CHECK
Date Time Interrogation Session: 20230131073120
Implantable Lead Implant Date: 20180412
Implantable Lead Implant Date: 20180412
Implantable Lead Location: 753859
Implantable Lead Location: 753860
Implantable Lead Model: 377
Implantable Lead Model: 402266
Implantable Lead Serial Number: 4970639
Implantable Lead Serial Number: 49730543
Implantable Pulse Generator Implant Date: 20180412
Pulse Gen Model: 404623
Pulse Gen Serial Number: 60967865

## 2021-09-16 NOTE — Progress Notes (Signed)
Remote ICD transmission.   

## 2021-09-19 ENCOUNTER — Encounter: Payer: Self-pay | Admitting: Podiatry

## 2021-09-19 ENCOUNTER — Ambulatory Visit (INDEPENDENT_AMBULATORY_CARE_PROVIDER_SITE_OTHER): Payer: Medicare Other | Admitting: Podiatry

## 2021-09-19 VITALS — Temp 96.9°F

## 2021-09-19 DIAGNOSIS — I739 Peripheral vascular disease, unspecified: Secondary | ICD-10-CM

## 2021-09-19 DIAGNOSIS — Q828 Other specified congenital malformations of skin: Secondary | ICD-10-CM

## 2021-09-19 DIAGNOSIS — B351 Tinea unguium: Secondary | ICD-10-CM | POA: Diagnosis not present

## 2021-09-19 DIAGNOSIS — I249 Acute ischemic heart disease, unspecified: Secondary | ICD-10-CM | POA: Insufficient documentation

## 2021-09-19 DIAGNOSIS — L03032 Cellulitis of left toe: Secondary | ICD-10-CM

## 2021-09-19 DIAGNOSIS — M79674 Pain in right toe(s): Secondary | ICD-10-CM

## 2021-09-19 DIAGNOSIS — E1142 Type 2 diabetes mellitus with diabetic polyneuropathy: Secondary | ICD-10-CM | POA: Diagnosis not present

## 2021-09-19 DIAGNOSIS — M79675 Pain in left toe(s): Secondary | ICD-10-CM

## 2021-09-19 DIAGNOSIS — E119 Type 2 diabetes mellitus without complications: Secondary | ICD-10-CM

## 2021-09-19 MED ORDER — DOXYCYCLINE HYCLATE 100 MG PO CAPS: 100.0000 mg | ORAL_CAPSULE | Freq: Two times a day (BID) | ORAL | 0 refills | Status: AC

## 2021-09-19 NOTE — Progress Notes (Signed)
ANNUAL DIABETIC FOOT EXAM  Subjective: Lori Rivera presents today for for annual diabetic foot examination.  Patient relates  h/o diabetes.  Patient denies any h/o foot wounds.  Patient admits symptoms of numbness in foot LLE.  Patient admits symptoms of foot tingling.  Patient admits symptoms of burning in feet.  Patient did not check blood glucose this morning.  Risk factors: diabetes, PAD, h/o MI, HTN, CAD.  Garwin Brothers, MD is patient's PCP. Last visit was one week ago.  Today, patient relates painful left 5th digit. Denies any preceding episode of trauma. Onset was gradual about 3 days ago. Location is outside of left 5th toe. Aggravating factor is wearing enclosed shoe gear. She also states it is painful to put a sock on. Digit is red and swollen. Denies any drainage. Denies any abnormal coolness.  Past Medical History:  Diagnosis Date   Aortic regurgitation 09/24/2017   Arthritis 09/24/2017   CAD in native artery 03/07/2015   Overview:  1. Out of hospital cardiac arrest Aug 2016 with MI, shock, PCI and 2 Xience DES to mid and distal culprit RCA with diffuse LAD disease and hypothermia protocol 2. NonSTEMI,05/27/15  Conclusions: 1. Pt was turned down by CTS due to co-morbidities. Pt is for PCI to multiple lesions on LAD and RCA. Ramus is small, will treat medically. 2. Successful PCI / Xience Drug Eluting Stent of the m   Chest pain 08/08/2015   CHF (congestive heart failure) ([place]) 09/24/2017   Chronic combined systolic and diastolic heart failure ([place]) 08/19/2015   Overview:  Echo 08/03/15: Left Ventricle Normal LV chamber size. Normal LV wall thickness. Mildly reduced overall LV systolic function. Estimated LVEF 45% Inferior, posterior and inferoseptal hypokinesis. Abnormal LV diastolic function, Grade 2, with echo evidence of elevated LA pressure.  Overview:  MUGA 20% 09/03/16 Overview:  Echo 08/03/15: Left Ventricle Normal LV chamber size. 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 Pt was turned down by CTS due to co-morbidities. Pt is for PCI to multiple lesions on LAD and RCA. Ramus is small, will treat medically. 2. Successful PCI / Xience Drug Eluting Stent of the mid and distal Left Anterior Descending Coronary Artery. 3. Successful PCI / Xience Drug Eluting Stent of the ostial and mid Right Coronary Artery. 4. POBA to Di   PNA (pneumonia) 08/08/2015   Pneumonia due to infectious organism 08/19/2015   Overview:  Discharge summary 08/11/15: Her chest x-ray showed airspace opacities concerning for multifocal pneumonia and she will be discharged on oral Levaquin. Follow-up chest x-ray is recommended to ensure resolution.   Shortness of breath 09/24/2017   ST elevation myocardial infarction involving right coronary artery ([place]) 08/03/2015   Overview:  Due to ISR and acute stent thrombosis Cath 08/03/15: Diagnostic procedure: Left Heart Cath PCI procedure: Stent DES, Thrombectomy Complications: No Complications. Conclusions Diagnostic Summary Acute occlusion of  RCA ostial stent thrombosis Severe stenosis of the mid RCA, partially ISR Patent LAD stent CTO of small circ system Interventional Summary Successful Thrombectomy/PTCA of the o   STEMI (ST elevation myocardial infarction) ([place]) 08/03/2015   Type 2  diabetes mellitus, without long-term current use of insulin ([place]) 03/07/2015   Vitamin B 12 deficiency 09/24/2017   Vitamin D deficiency 09/24/2017   Patient Active Problem List   Diagnosis Date Noted   Acute coronary syndrome ([place]) 09/19/2021   Stage III pressure ulcer of sacral region ([place]) 04/12/2019   Acute respiratory failure with hypoxia ([place]) 04/07/2019   Cardiogenic pulmonary edema ([place])    Unstable angina ([place]) 03/31/2019   Pressure injury of skin 03/31/2019   Palpitations 01/17/2019   PAT (paroxysmal atrial tachycardia) ([place]) 01/17/2019   Acute renal failure superimposed on stage 3 chronic kidney disease ([place]) 11/27/2017   Arthritis 09/24/2017   CHF (congestive heart failure) ([place]) 09/24/2017   Shortness of breath 09/24/2017   Aortic regurgitation 09/24/2017   Vitamin B 12 deficiency 09/24/2017   Vitamin D deficiency 09/24/2017   Essential hypertension 06/29/2017   Coronary artery disease involving native heart with angina pectoris ([place]) 06/29/2017   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 04/15/2017   Ischemic cardiomyopathy 11/18/2015   Chronic combined systolic and diastolic heart failure ([place]) 08/19/2015   Pneumonia due to infectious organism 08/19/2015   Chest pain 08/08/2015   PNA (pneumonia) 08/08/2015   ST elevation myocardial infarction involving right coronary artery ([place]) 08/03/2015   STEMI (ST elevation myocardial infarction) ([place]) 08/03/2015   Acute on chronic systolic heart failure ([place]) 06/07/2015   CKD (chronic kidney disease) stage 3, GFR 30-59 ml/min (HCC) 06/07/2015   NSTEMI (non-ST elevated myocardial infarction) ([place]) 05/25/2015   CAD in native artery 03/07/2015   Hyperlipidemia 03/07/2015   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 ([place]) 03/07/2015   Type 2 diabetes mellitus, without long-term current use of insulin ([place]) 03/07/2015   Past Surgical History:  Procedure Laterality Date   C-spine surgery      CATARACT EXTRACTION, BILATERAL     CHOLECYSTECTOMY     CORONARY ANGIOPLASTY WITH STENT PLACEMENT     CORONARY ATHERECTOMY N/A 04/08/2019   Procedure: CORONARY ATHERECTOMY;  Surgeon: Sherren Mocha, MD;  Location: [facility];  Service: Cardiovascular;  Laterality: N/A;   CORONARY STENT INTERVENTION W/IMPELLA N/A 04/08/2019   Procedure: Coronary Stent Intervention w/Impella;  Surgeon: Sherren Mocha, MD;  Location: [facility];  Service: Cardiovascular;  Laterality: N/A;   INSERT / REPLACE / REMOVE PACEMAKER     ICD, Medtronic   LEFT HEART CATH AND CORONARY ANGIOGRAPHY N/A 03/31/2019   Procedure: LEFT HEART CATH AND CORONARY ANGIOGRAPHY;  Surgeon: Belva Crome, MD;  Location: [facility];  Service: Cardiovascular;  Laterality: N/A;   LITHOTRIPSY     URETEROSCOPY     Current Outpatient Medications on File Prior to Visit  Medication Sig Dispense Refill   isosorbide dinitrate (ISORDIL) 20 MG tablet Take by mouth.     aspirin EC 81 MG tablet Take 81 mg by mouth daily.      atorvastatin (LIPITOR) 40 MG tablet Take 1 tablet (40 mg total) by mouth daily at 6 PM. 30 tablet 0   atorvastatin (LIPITOR) 40 MG tablet Take by mouth.     BD PEN NEEDLE NANO 2ND GEN 32G X 4 MM MISC      clopidogrel (PLAVIX) 75 MG tablet Take 1 tablet (75 mg total)  by mouth daily. 90 tablet 3   furosemide (LASIX) 40 MG tablet Take 20-40 mg by mouth 2 (two) times daily. Take 40 mg (one tablet) in the morning and 20 mg (half tablet) in the evening.     isosorbide mononitrate (IMDUR) 60 MG 24 hr tablet Take 1 tablet (60 mg total) by mouth daily. 30 tablet 0   JARDIANCE 25 MG TABS tablet Take 25 mg by mouth every morning.     metoprolol tartrate (LOPRESSOR) 100 MG tablet TAKE 1 TABLET BY MOUTH TWICE A DAY 180 tablet 2   nitroGLYCERIN (NITROSTAT) 0.4 MG SL tablet Place 0.4 mg under the tongue every 5 (five) minutes as needed.      TRESIBA FLEXTOUCH 100 UNIT/ML SOPN FlexTouch Pen Inject 20 Units into the  skin daily.      TRULICITY 3 OZ/2.2QM SOPN once a week.     No current facility-administered medications on file prior to visit.    Allergies  Allergen Reactions   Codeine Other (See Comments)    confused   Penicillins Rash    Did it involve swelling of the face/tongue/throat, SOB, or low BP? Yes Did it involve sudden or severe rash/hives, skin peeling, or any reaction on the inside of your mouth or nose? No Did you need to seek medical attention at a hospital or doctor's office? Yes When did it last happen?     20 yrs ago  If all above answers are NO, may proceed with cephalosporin use.    Social History   Occupational History   Not on file  Tobacco Use   Smoking status: Former    Types: Cigarettes    Quit date: 06/07/1985    Years since quitting: 36.3   Smokeless tobacco: Never  Vaping Use   Vaping Use: Never used  Substance and Sexual Activity   Alcohol use: No   Drug use: No   Sexual activity: Not on file   Family History  Problem Relation Age of Onset   Diabetes Mother    Hypertension Mother    Stroke Mother    Cancer Father    Cancer Sister    CAD Brother    CAD Brother    Immunization History  Administered Date(s) Administered   Influenza-Unspecified 06/11/2018   Pneumococcal Polysaccharide-23 04/05/2019     Review of Systems: Negative except as noted in the HPI.   Objective: Vitals:   09/19/21 1407  Temp: (!) 96.9 F (36.1 C)    Lori Rivera is a pleasant 76 y.o. female in NAD. AAO X 3.  Vascular Examination: CFT <3 seconds b/l LE. Faintly palpable DP pulses b/l LE. Diminished PT pulse(s) b/l LE. Pedal hair sparse. No pain with calf compression b/l. Lower extremity skin temperature gradient within normal limits. +1 pitting edema noted BLE. No ischemia or gangrene noted b/l LE. No cyanosis or clubbing noted b/l LE.  Dermatological Examination: No open wounds b/l LE. No interdigital macerations noted b/l LE. Toenails 1-5 b/l elongated,  discolored, dystrophic, thickened, crumbly with subungual debris and tenderness to dorsal palpation. Left 5th toe is erythematous and swollen. No sign of underlying abscess. There is tenderness to palpation. Porokeratotic lesion(s) plantar heel pad of left foot. No erythema, no edema, no drainage, no fluctuance.  Musculoskeletal Examination: Muscle strength 5/5 to all lower extremity muscle groups bilaterally. Hammertoe deformity noted 2-5 b/l. Pes planus deformity noted bilateral LE.  Footwear Assessment: Does the patient wear appropriate shoes? Yes. Does the patient need inserts/orthotics? No.  Neurological Examination: Pt has subjective symptoms of neuropathy. Protective sensation intact 5/5 intact bilaterally with 10g monofilament b/l. Vibratory sensation intact b/l.  Assessment: 1. Pain due to onychomycosis of toenails of both feet   2. Cellulitis of fifth toe of left foot   3. Porokeratosis   4. Diabetic polyneuropathy associated with type 2 diabetes mellitus ([place])   5. PVD (peripheral vascular disease) ([place])   6. Encounter for diabetic foot exam ([place])     ADA Risk Categorization: High Risk  Patient has one or more of the following: Loss of protective sensation Absent pedal pulses Severe Foot deformity History of foot ulcer  Plan: -Examined patient. -Diabetic foot examination performed today. -Mycotic toenails 1-5 bilaterally were debrided in length and girth with sterile nail nippers and dremel without incident. -Painful porokeratotic lesion(s) plantar heel pad of left foot pared and enucleated with sterile scalpel blade without incident. Total number of lesions debrided=1. -Rx for Doxycyline 100 mg, #14, to be taken one capsule twice daily for 7 days. -Prescribed epsom salt soaks for painful left 5th digit. Patient given written instructions. Call office if condition worsens. -Patient/POA to call should there be question/concern in the interim.  Return in about 3 months  (around 12/17/2021).  Marzetta Board, DPM

## 2021-09-19 NOTE — Patient Instructions (Signed)
EPSOM SALT FOOT SOAK INSTRUCTIONS  *IF YOU HAVE BEEN PRESCRIBED ANTIBIOTICS, TAKE AS INSTRUCTED UNTIL ALL ARE GONE*  Shopping List:  A. Plain epsom salt (not scented) B. Neosporin Cream   Place 1/4 cup of epsom salts in 2 quarts of warm tap water. IF YOU ARE DIABETIC, OR HAVE NEUROPATHY, CHECK THE TEMPERATURE OF THE WATER WITH YOUR ELBOW.   Submerge your foot/feet in the solution and soak for 10-15 minutes.      3.  Next, remove your foot/feet from solution, blot dry the affected area.    4.  Apply light amount of antibiotic cream/ointment and cover with fabric band-aid .  5.  This soak should be done once a day for 7 days.   6.  Monitor for any signs/symptoms of infection such as redness, swelling, odor, drainage, increased pain, or non-healing of digit.   7.  Please do not hesitate to call the office and speak to a Nurse or Doctor if you have questions.   8.  If you experience fever, chills, nightsweats, nausea or vomiting with worsening of digit/foot, please go to the emergency room.  

## 2021-09-26 ENCOUNTER — Encounter: Payer: Self-pay | Admitting: Podiatry

## 2021-09-27 ENCOUNTER — Other Ambulatory Visit: Payer: Self-pay | Admitting: Cardiology

## 2021-12-09 ENCOUNTER — Ambulatory Visit (INDEPENDENT_AMBULATORY_CARE_PROVIDER_SITE_OTHER): Payer: Medicare Other

## 2021-12-09 DIAGNOSIS — I255 Ischemic cardiomyopathy: Secondary | ICD-10-CM

## 2021-12-10 LAB — CUP PACEART REMOTE DEVICE CHECK
Date Time Interrogation Session: 20230502082618
Implantable Lead Implant Date: 20180412
Implantable Lead Implant Date: 20180412
Implantable Lead Location: 753859
Implantable Lead Location: 753860
Implantable Lead Model: 377
Implantable Lead Model: 402266
Implantable Lead Serial Number: 4970639
Implantable Lead Serial Number: 49730543
Implantable Pulse Generator Implant Date: 20180412
Pulse Gen Model: 404623
Pulse Gen Serial Number: 60967865

## 2021-12-24 NOTE — Progress Notes (Signed)
Remote ICD transmission.   

## 2021-12-26 ENCOUNTER — Ambulatory Visit (INDEPENDENT_AMBULATORY_CARE_PROVIDER_SITE_OTHER): Payer: Medicare Other | Admitting: Podiatry

## 2021-12-26 DIAGNOSIS — Z91199 Patient's noncompliance with other medical treatment and regimen due to unspecified reason: Secondary | ICD-10-CM

## 2021-12-26 NOTE — Progress Notes (Signed)
   Complete physical exam  Patient: Lori Rivera   DOB: 05/31/2001   76 y.o. Female  MRN: [id number]  Subjective:    No chief complaint on file.   Lori Rivera is a 76 y.o. female who presents today for a complete physical exam. She reports consuming a {diet types:17450} diet. {types:19826} She generally feels {DESC; WELL/FAIRLY WELL/POORLY:18703}. She reports sleeping {DESC; WELL/FAIRLY WELL/POORLY:18703}. She {does/does not:200015} have additional problems to discuss today.    Most recent fall risk assessment:    02/05/2022   10:42 AM  Fall Risk   Falls in the past year? 0  Number falls in past yr: 0  Injury with Fall? 0  Risk for fall due to : No Fall Risks  Follow up Falls evaluation completed     Most recent depression screenings:    02/05/2022   10:42 AM 12/27/2020   10:46 AM  PHQ 2/9 Scores  PHQ - 2 Score 0 0  PHQ- 9 Score 5     {VISON DENTAL STD PSA (Optional):27386}  {History (Optional):23778}  Patient Care Team: Jessup, Joy, NP as PCP - General (Nurse Practitioner)   Outpatient Medications Prior to Visit  Medication Sig   fluticasone (FLONASE) 50 MCG/ACT nasal spray Place 2 sprays into both nostrils in the morning and at bedtime. After 7 days, reduce to once daily.   norgestimate-ethinyl estradiol (SPRINTEC 28) 0.25-35 MG-MCG tablet Take 1 tablet by mouth daily.   Nystatin POWD Apply liberally to affected area 2 times per day   spironolactone (ALDACTONE) 100 MG tablet Take 1 tablet (100 mg total) by mouth daily.   No facility-administered medications prior to visit.    ROS        Objective:     There were no vitals taken for this visit. {Vitals History (Optional):23777}  Physical Exam   No results found for any visits on 03/13/22. {Show previous labs (optional):23779}    Assessment & Plan:    Routine Health Maintenance and Physical Exam  Immunization History  Administered Date(s) Administered   DTaP 08/14/1999, 10/10/1999,  12/19/1999, 09/03/2000, 03/19/2004   Hepatitis A 01/14/2008, 01/19/2009   Hepatitis B 06/01/1999, 07/09/1999, 12/19/1999   HiB (PRP-OMP) 08/14/1999, 10/10/1999, 12/19/1999, 09/03/2000   IPV 08/14/1999, 10/10/1999, 06/08/2000, 03/19/2004   Influenza,inj,Quad PF,6+ Mos 04/21/2014   Influenza-Unspecified 07/21/2012   MMR 06/08/2001, 03/19/2004   Meningococcal Polysaccharide 01/19/2012   Pneumococcal Conjugate-13 09/03/2000   Pneumococcal-Unspecified 12/19/1999, 03/03/2000   Tdap 01/19/2012   Varicella 06/08/2000, 01/14/2008    Health Maintenance  Topic Date Due   HIV Screening  Never done   Hepatitis C Screening  Never done   INFLUENZA VACCINE  03/11/2022   PAP-Cervical Cytology Screening  03/13/2022 (Originally 05/30/2020)   PAP SMEAR-Modifier  03/13/2022 (Originally 05/30/2020)   TETANUS/TDAP  03/13/2022 (Originally 01/18/2022)   HPV VACCINES  Discontinued   COVID-19 Vaccine  Discontinued    Discussed health benefits of physical activity, and encouraged her to engage in regular exercise appropriate for her age and condition.  Problem List Items Addressed This Visit   None Visit Diagnoses     Annual physical exam    -  Primary   Cervical cancer screening       Need for Tdap vaccination          No follow-ups on file.     Joy Jessup, NP   

## 2022-03-03 ENCOUNTER — Other Ambulatory Visit: Payer: Self-pay | Admitting: Cardiology

## 2022-03-10 ENCOUNTER — Ambulatory Visit (INDEPENDENT_AMBULATORY_CARE_PROVIDER_SITE_OTHER): Payer: Medicare Other

## 2022-03-10 DIAGNOSIS — I255 Ischemic cardiomyopathy: Secondary | ICD-10-CM

## 2022-03-10 LAB — CUP PACEART REMOTE DEVICE CHECK
Date Time Interrogation Session: 20230731083853
Implantable Lead Implant Date: 20180412
Implantable Lead Implant Date: 20180412
Implantable Lead Location: 753859
Implantable Lead Location: 753860
Implantable Lead Model: 377
Implantable Lead Model: 402266
Implantable Lead Serial Number: 4970639
Implantable Lead Serial Number: 49730543
Implantable Pulse Generator Implant Date: 20180412
Pulse Gen Model: 404623
Pulse Gen Serial Number: 60967865

## 2022-03-17 ENCOUNTER — Other Ambulatory Visit: Payer: Self-pay | Admitting: Cardiology

## 2022-04-04 ENCOUNTER — Telehealth: Payer: Self-pay

## 2022-04-04 NOTE — Telephone Encounter (Signed)
Outreach made to Pt.  Pt states box is plugged in next to her bed.  Will determine next steps.
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
## 2022-04-04 NOTE — Telephone Encounter (Signed)
No messages received for at least 21 days Last message received 24 days ago. The patient will be deactivated in 66 days.

## 2022-04-07 NOTE — Telephone Encounter (Signed)
Per [employer], Pt is now reconnected.    Outreach made to daughter to advise.  Follow up appt scheduled.

## 2022-04-11 NOTE — Progress Notes (Signed)
Remote ICD transmission.   

## 2022-05-19 ENCOUNTER — Encounter: Payer: Medicare Other | Admitting: Cardiology

## 2022-06-09 ENCOUNTER — Ambulatory Visit (INDEPENDENT_AMBULATORY_CARE_PROVIDER_SITE_OTHER): Payer: Medicare Other

## 2022-06-09 DIAGNOSIS — I255 Ischemic cardiomyopathy: Secondary | ICD-10-CM | POA: Diagnosis not present

## 2022-06-09 LAB — CUP PACEART REMOTE DEVICE CHECK
Date Time Interrogation Session: 20231030142716
Implantable Lead Connection Status: 753985
Implantable Lead Connection Status: 753985
Implantable Lead Implant Date: 20180412
Implantable Lead Implant Date: 20180412
Implantable Lead Location: 753859
Implantable Lead Location: 753860
Implantable Lead Model: 377
Implantable Lead Model: 402266
Implantable Lead Serial Number: 4970639
Implantable Lead Serial Number: 49730543
Implantable Pulse Generator Implant Date: 20180412
Pulse Gen Model: 404623
Pulse Gen Serial Number: 60967865

## 2022-06-16 ENCOUNTER — Ambulatory Visit: Payer: Medicare Other | Attending: Cardiology | Admitting: Cardiology

## 2022-06-16 ENCOUNTER — Encounter: Payer: Self-pay | Admitting: Cardiology

## 2022-06-16 VITALS — BP 128/62 | HR 69 | Ht 69.0 in | Wt 212.0 lb

## 2022-06-16 DIAGNOSIS — I2510 Atherosclerotic heart disease of native coronary artery without angina pectoris: Secondary | ICD-10-CM

## 2022-06-16 DIAGNOSIS — I4901 Ventricular fibrillation: Secondary | ICD-10-CM | POA: Diagnosis present

## 2022-06-16 DIAGNOSIS — I255 Ischemic cardiomyopathy: Secondary | ICD-10-CM | POA: Diagnosis not present

## 2022-06-16 DIAGNOSIS — I5022 Chronic systolic (congestive) heart failure: Secondary | ICD-10-CM | POA: Diagnosis present

## 2022-06-16 LAB — CUP PACEART INCLINIC DEVICE CHECK
Battery Remaining Longevity: 50
Brady Statistic RA Percent Paced: 64 %
Brady Statistic RV Percent Paced: 2 %
Date Time Interrogation Session: 20231106160124
Implantable Lead Connection Status: 753985
Implantable Lead Connection Status: 753985
Implantable Lead Implant Date: 20180412
Implantable Lead Implant Date: 20180412
Implantable Lead Location: 753859
Implantable Lead Location: 753860
Implantable Lead Model: 377
Implantable Lead Model: 402266
Implantable Lead Serial Number: 4970639
Implantable Lead Serial Number: 49730543
Implantable Pulse Generator Implant Date: 20180412
Lead Channel Impedance Value: 566 Ohm
Lead Channel Impedance Value: 725 Ohm
Lead Channel Pacing Threshold Amplitude: 0.6 V
Lead Channel Pacing Threshold Amplitude: 0.6 V
Lead Channel Pacing Threshold Amplitude: 0.7 V
Lead Channel Pacing Threshold Pulse Width: 0.4 ms
Lead Channel Pacing Threshold Pulse Width: 0.75 ms
Lead Channel Pacing Threshold Pulse Width: 0.75 ms
Lead Channel Sensing Intrinsic Amplitude: 22.4 mV
Lead Channel Sensing Intrinsic Amplitude: 3.1 mV
Pulse Gen Model: 404623
Pulse Gen Serial Number: 60967865

## 2022-06-16 NOTE — Patient Instructions (Signed)
Medication Instructions:  Your physician recommends that you continue on your current medications as directed. Please refer to the Current Medication list given to you today.  *If you need a refill on your cardiac medications before your next appointment, please call your pharmacy*   Lab Work: None ordered   Testing/Procedures: None ordered   Follow-Up: At [facility], you and your health needs are our priority.  As part of our continuing mission to provide you with exceptional heart care, we have created designated Provider Care Teams.  These Care Teams include your primary Cardiologist (physician) and Advanced Practice Providers (APPs -  Physician Assistants and Nurse Practitioners) who all work together to provide you with the care you need, when you need it.  Remote monitoring is used to monitor your Pacemaker or ICD from home. This monitoring reduces the number of office visits required to check your device to one time per year. It allows [place] to keep an eye on the functioning of your device to ensure it is working properly. You are scheduled for a device check from home on 09/08/2022. You may send your transmission at any time that day. If you have a wireless device, the transmission will be sent automatically. After your physician reviews your transmission, you will receive a postcard with your next transmission date.  Your physician recommends that you schedule a follow-up appointment with Dr. Bettina Gavia   Your next appointment:   1 year(s)  The format for your next appointment:   In Person  Provider:   Allegra Lai, MD    Thank you for choosing [facility]!!   Trinidad Curet, RN [phone]    Other Instructions  Important Information About Sugar

## 2022-06-16 NOTE — Progress Notes (Signed)
Electrophysiology Office Note   Date:  06/16/2022   ID:  Lori Rivera, DOB Mar 27, 1946, MRN [id number]  PCP:  Garwin Brothers, MD  Cardiologist:  Bettina Gavia Primary Electrophysiologist:  Toshia Larkin Meredith Leeds, MD    Chief Complaint: ICD   History of Present Illness: Lori Rivera is a 76 y.o. female who is being seen today for the evaluation of ICD at the request of Garwin Brothers, MD. Presenting today for electrophysiology evaluation.  She has a history significant coronary artery disease with multiple PCI's, chronic systolic heart failure, hypertension, hyperlipidemia, type 2 diabetes, orthostatic hypotension.  She is status post [employer] ICD.  On 01/05/2020 she had a VF episode.  She was unaware of this.  She had received ICD therapy.  Today, she denies symptoms of palpitations, chest pain, shortness of breath, orthopnea, PND, lower extremity edema, claudication, dizziness, presyncope, syncope, bleeding, or neurologic sequela. The patient is tolerating medications without difficulties.    Past Medical History:  Diagnosis Date   Aortic regurgitation 09/24/2017   Arthritis 09/24/2017   CAD in native artery 03/07/2015   Overview:  1. Out of hospital cardiac arrest Aug 2016 with MI, shock, PCI and 2 Xience DES to mid and distal culprit RCA with diffuse LAD disease and hypothermia protocol 2. NonSTEMI,05/27/15  Conclusions: 1. Pt was turned down by CTS due to co-morbidities. Pt is for PCI to multiple lesions on LAD and RCA. Ramus is small, Alexsandro Salek treat medically. 2. Successful PCI / Xience Drug Eluting Stent of the m   Chest pain 08/08/2015   CHF (congestive heart failure) ([place]) 09/24/2017   Chronic combined systolic and diastolic heart failure ([place]) 08/19/2015   Overview:  Echo 08/03/15: Left Ventricle Normal LV chamber size. Normal LV wall thickness. Mildly reduced overall LV systolic function. Estimated LVEF 45% Inferior, posterior and inferoseptal hypokinesis. Abnormal LV diastolic function, Grade 2,  with echo evidence of elevated LA pressure.  Overview:  MUGA 20% 09/03/16 Overview:  Echo 08/03/15: Left Ventricle Normal LV chamber size. 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 Pt was turned down by CTS due to co-morbidities. Pt is for PCI to multiple lesions on LAD and RCA. Ramus is small, Romie Tay treat medically. 2. Successful PCI / Xience Drug Eluting Stent of the mid and distal Left Anterior Descending Coronary Artery. 3. Successful PCI / Xience Drug Eluting Stent of the ostial and mid Right Coronary Artery. 4. POBA to Di   PNA (pneumonia) 08/08/2015   Pneumonia due to infectious organism 08/19/2015   Overview:  Discharge summary 08/11/15: Her chest x-ray showed airspace opacities concerning for multifocal pneumonia and she Tiena Manansala be discharged on oral Levaquin. Follow-up chest x-ray is recommended to ensure resolution.   Shortness of breath 09/24/2017   ST elevation myocardial infarction involving right coronary artery ([place]) 08/03/2015   Overview:  Due to ISR and acute stent thrombosis Cath  08/03/15: Diagnostic procedure: Left Heart Cath PCI procedure: Stent DES, Thrombectomy Complications: No Complications. Conclusions Diagnostic Summary Acute occlusion of RCA ostial stent thrombosis Severe stenosis of the mid RCA, partially ISR  Patent LAD stent CTO of small circ system Interventional Summary Successful Thrombectomy/PTCA of the o   STEMI (ST elevation myocardial infarction) ([place]) 08/03/2015   Type 2 diabetes mellitus, without long-term current use of insulin ([place]) 03/07/2015   Vitamin B 12 deficiency 09/24/2017   Vitamin D deficiency 09/24/2017   Past Surgical History:  Procedure Laterality Date   C-spine surgery     CATARACT EXTRACTION, BILATERAL     CHOLECYSTECTOMY     CORONARY ANGIOPLASTY WITH STENT PLACEMENT     CORONARY ATHERECTOMY N/A 04/08/2019   Procedure: CORONARY ATHERECTOMY;  Surgeon: Sherren Mocha, MD;  Location: [facility];  Service: Cardiovascular;  Laterality: N/A;   CORONARY STENT INTERVENTION W/IMPELLA N/A 04/08/2019   Procedure: Coronary Stent Intervention w/Impella;  Surgeon: Sherren Mocha, MD;  Location: [place];  Service: Cardiovascular;  Laterality: N/A;   INSERT / REPLACE / REMOVE PACEMAKER     ICD, [employer]onic   LEFT HEART CATH AND CORONARY ANGIOGRAPHY N/A 03/31/2019   Procedure: LEFT HEART CATH AND CORONARY ANGIOGRAPHY;  Surgeon: Belva Crome, MD;  Location: [facility];  Service: Cardiovascular;  Laterality: N/A;   LITHOTRIPSY     URETEROSCOPY       Current Outpatient Medications  Medication Sig Dispense Refill   aspirin EC 81 MG tablet Take 81 mg by mouth daily.      atorvastatin (LIPITOR) 40 MG tablet Take 1 tablet (40 mg total) by mouth daily at 6 PM. 30 tablet 0   atorvastatin (LIPITOR) 40 MG tablet Take by mouth.     BD PEN NEEDLE NANO 2ND GEN 32G X 4 MM MISC      clopidogrel (PLAVIX) 75 MG tablet TAKE 1 TABLET BY MOUTH EVERY DAY 90 tablet 3   furosemide (LASIX) 40 MG tablet Take 20-40 mg by mouth 2 (two) times daily. Take 40 mg (one tablet) in the morning and 20 mg (half tablet) in the evening.     isosorbide dinitrate (ISORDIL) 20 MG tablet Take by mouth.     isosorbide mononitrate (IMDUR) 60 MG 24 hr tablet Take 1 tablet (60 mg total) by mouth daily.  30 tablet 0   JARDIANCE 25 MG TABS tablet Take 25 mg by mouth every morning.     metoprolol tartrate (LOPRESSOR) 100 MG tablet TAKE 1 TABLET BY MOUTH TWICE A DAY 30 tablet 0   nitroGLYCERIN (NITROSTAT) 0.4 MG SL tablet Place 0.4 mg under the tongue every 5 (five) minutes as needed.      TRESIBA FLEXTOUCH 100 UNIT/ML SOPN FlexTouch Pen Inject 20 Units into the skin daily.      TRULICITY 3 QZ/0.0PQ SOPN once a week.     No current facility-administered medications for this visit.    Allergies:   Codeine and Penicillins   Social History:  The patient  reports that she quit smoking about 37 years ago. She has never used smokeless tobacco. She reports that she does not drink alcohol and does not use drugs.   Family History:  The patient's family history includes CAD in her brother and brother; Cancer in her father and sister; Diabetes in her mother; Hypertension in her mother; Stroke in her mother.    ROS:  Please see the history of present illness.   Otherwise, review of systems  is positive for none.   All other systems are reviewed and negative.    PHYSICAL EXAM: VS:  There were no vitals taken for this visit. , BMI There is no height or weight on file to calculate BMI. GEN: Well nourished, well developed, in no acute distress  HEENT: normal  Neck: no JVD, carotid bruits, or masses Cardiac: RRR; no murmurs, rubs, or gallops,no edema  Respiratory:  clear to auscultation bilaterally, normal work of breathing GI: soft, nontender, nondistended, + BS MS: no deformity or atrophy  Skin: warm and dry, device pocket is well healed Neuro:  Strength and sensation are intact Psych: euthymic mood, full affect  EKG:  EKG is ordered today. Personal review of the ekg ordered shows atrial paced, septal Q waves, inferior Q waves, T wave inversions  Device interrogation is reviewed today in detail.  See PaceArt for details.   Recent Labs: No results found for requested labs within last 365 days.     Lipid Panel     Component Value Date/Time   CHOL 103 09/21/2020 1443   TRIG 106 09/21/2020 1443   HDL 37 (L) 09/21/2020 1443   CHOLHDL 2.8 09/21/2020 1443   CHOLHDL 3.0 03/31/2019 0750   VLDL 13 03/31/2019 0750   LDLCALC 46 09/21/2020 1443     Wt Readings from Last 3 Encounters:  09/21/20 203 lb 9.6 oz (92.4 kg)  02/23/20 191 lb 9.6 oz (86.9 kg)  01/20/20 188 lb 9.6 oz (85.5 kg)      Other studies Reviewed: Additional studies/ records that were reviewed today include: TTE 05/27/21  Review of the above records today demonstrates:  Ejection fraction 35 to 93% Impaired diastolic filling Mild to moderate aortic regurgitation Mild aortic stenosis Mild mitral regurgitation   ASSESSMENT AND PLAN:  1.  Chronic systolic heart failure: Due to ischemic cardiomyopathy.  Has not been able to titrate optimal medical therapy due to orthostasis.  Is status post [employer] ICD implanted 11/21/2016.  Device functioning appropriately.  No changes.  2.  Ventricular fibrillation: Had therapy from device.  Had up titration of her beta-blocker.  Has had no further prolonged arrhythmias.  No changes.  3.  Coronary artery disease: No current chest pain.  Continue with plan per primary cardiology.  Current medicines are reviewed at length with the patient today.   The patient does not have concerns regarding her medicines.  The following changes were made today:  none  Labs/ tests ordered today include:  No orders of the defined types were placed in this encounter.    Disposition:   FU with Noela Brothers 1 year  Signed, Sada Mazzoni Meredith Leeds, MD  06/16/2022 9:44 AM     [facility] [address] [place] [address] [phone] (office) [phone] (fax)

## 2022-06-28 ENCOUNTER — Other Ambulatory Visit: Payer: Self-pay | Admitting: Cardiology

## 2022-06-30 ENCOUNTER — Ambulatory Visit: Payer: Medicare Other | Admitting: Internal Medicine

## 2022-06-30 VITALS — BP 126/74 | HR 118 | Temp 97.3°F | Resp 18 | Wt 207.8 lb

## 2022-06-30 DIAGNOSIS — M25541 Pain in joints of right hand: Secondary | ICD-10-CM

## 2022-06-30 MED ORDER — CELECOXIB 100 MG PO CAPS: 100.0000 mg | ORAL_CAPSULE | Freq: Every day | ORAL | 2 refills | Status: DC

## 2022-06-30 NOTE — Progress Notes (Unsigned)
   Acute Office Visit  Subjective:     Patient ID: Lori Rivera, female    DOB: 07-24-1946, 76 y.o.   MRN: [id number]  Chief Complaint  Patient presents with   Hand Pain    Pointer and migrating to other fingers starting about a week ago    HPI Patient is in today for ***  ROS      Objective:    BP 126/74 (BP Location: Left Arm, Patient Position: Sitting, Cuff Size: Normal)   Pulse (!) 118   Temp (!) 97.3 F (36.3 C) (Temporal)   Resp 18   Wt 207 lb 12.8 oz (94.3 kg)   SpO2 (!) 5%   BMI 30.69 kg/m  {Vitals History (Optional):23777}  Physical Exam  No results found for any visits on 06/30/22.      Assessment & Plan:   Problem List Items Addressed This Visit       Other   Pain in joint of right hand - Primary    No orders of the defined types were placed in this encounter.   No follow-ups on file.  Garwin Brothers, MD

## 2022-07-08 NOTE — Progress Notes (Signed)
Remote ICD transmission.   

## 2022-07-14 NOTE — Progress Notes (Deleted)
Cardiology Office Note:    Date:  07/15/2022   ID:  Lori Rivera, DOB 1946-05-05, MRN [id number]  PCP:  Lori Brothers, MD  Cardiologist:  Lori More, MD    Referring MD: Lori Brothers, MD    ASSESSMENT:    1. Coronary artery disease involving native coronary artery of native heart without angina pectoris   2. Chronic combined systolic and diastolic heart failure ([place])   3. 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 Nonrheumatic aortic valve stenosis   5. Mild aortic stenosis   6. Nonrheumatic mitral valve stenosis   7. Nonrheumatic mitral valve regurgitation   8. Mixed hyperlipidemia   9. Type 2 diabetes mellitus with hyperglycemia, with long-term current use of insulin (HCC)    PLAN:    In order of problems listed above:  ***   Next appointment: ***   Medication Adjustments/Labs and Tests Ordered: Current medicines are reviewed at length with the patient today.  Concerns regarding medicines are outlined above.  No orders of the defined types were placed in this encounter.  No orders of the defined types were placed in this encounter.   No chief complaint on file.   History of Present Illness:    Lori Rivera is a 76 y.o. female with a hx of CAD with multiple PCI's heart failure last ejection fraction moderately diminished hypertensive heart disease dyslipidemia type 2 diabetes with neuropathy orthostatic hypotension and ICD therapy.  She was last seen 09/21/2020.  She has had multiple no-shows to office visits in the interim.  Last ejection fraction 35 to 40% 05/27/2021 also mild to moderate aortic regurgitation mild aortic stenosis and mild mitral and tricuspid regurgitation.  She had an echocardiogram performed at [facility] during admission with hypoxic respiratory failure showing EF 40 to 45% mild aortic stenosis moderate aortic regurgitation  mild mitral regurgitation and mild mitral stenosis. Compliance with diet, lifestyle and medications: *** Past Medical History:  Diagnosis Date   Aortic regurgitation 09/24/2017   Arthritis 09/24/2017   CAD in native artery 03/07/2015   Overview:  1. Out of hospital cardiac arrest Aug 2016 with MI, shock, PCI and 2 Xience DES to mid and distal culprit RCA with diffuse LAD disease and hypothermia protocol 2. NonSTEMI,05/27/15  Conclusions: 1. Pt was turned down by CTS due to co-morbidities. Pt is for PCI to multiple lesions on LAD and RCA. Ramus is small, will treat medically. 2. Successful PCI / Xience Drug Eluting Stent of the m   Chest pain 08/08/2015   CHF (congestive heart failure) ([place]) 09/24/2017   Chronic combined systolic and diastolic heart failure ([place]) 08/19/2015   Overview:  Echo 08/03/15: Left Ventricle Normal LV chamber size. Normal LV wall thickness. Mildly reduced overall LV systolic function. Estimated LVEF 45% Inferior, posterior and inferoseptal hypokinesis. Abnormal LV diastolic function, Grade 2, with echo evidence of elevated LA pressure.  Overview:  MUGA 20% 09/03/16 Overview:  Echo 08/03/15: Left Ventricle Normal LV chamber size. 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 Pt was turned down by CTS due to  co-morbidities. Pt is for PCI to multiple lesions on LAD and RCA. Ramus is small, will treat medically. 2. Successful PCI / Xience Drug Eluting Stent of the mid and distal Left Anterior Descending Coronary Artery. 3. Successful PCI / Xience Drug Eluting Stent of the ostial and mid Right Coronary Artery. 4. POBA to Di   PNA (pneumonia) 08/08/2015   Pneumonia due to infectious organism 08/19/2015   Overview:  Discharge summary 08/11/15: Her chest x-ray showed airspace opacities concerning for multifocal pneumonia and she will be discharged on oral Levaquin. Follow-up chest x-ray is recommended to ensure resolution.   Shortness of breath 09/24/2017   ST elevation myocardial infarction involving right coronary artery ([place]) 08/03/2015   Overview:  Due to ISR and acute stent thrombosis Cath 08/03/15: Diagnostic procedure: Left Heart Cath PCI procedure: Stent DES, Thrombectomy Complications: No Complications. Conclusions Diagnostic Summary Acute occlusion of RCA ostial stent thrombosis Severe stenosis of the mid RCA, partially ISR Patent LAD stent CTO of small circ system Interventional Summary Successful Thrombectomy/PTCA of the o   STEMI (ST elevation myocardial infarction) ([place]) 08/03/2015   Type 2 diabetes mellitus, without long-term current use of insulin ([place]) 03/07/2015   Vitamin B 12 deficiency 09/24/2017   Vitamin D deficiency 09/24/2017    Past Surgical History:  Procedure Laterality Date   [place]-spine surgery     CATARACT EXTRACTION, BILATERAL     CHOLECYSTECTOMY     CORONARY ANGIOPLASTY WITH STENT PLACEMENT     CORONARY ATHERECTOMY N/A 04/08/2019   Procedure: CORONARY ATHERECTOMY;  Surgeon: Lori Mocha, MD;  Location: [facility];  Service: Cardiovascular;  Laterality: N/A;   CORONARY STENT INTERVENTION W/IMPELLA N/A 04/08/2019   Procedure: Coronary Stent Intervention w/Impella;  Surgeon: Lori Mocha, MD;  Location: [facility];  Service: Cardiovascular;  Laterality: N/A;    INSERT / REPLACE / REMOVE PACEMAKER     ICD, Medtronic   LEFT HEART CATH AND CORONARY ANGIOGRAPHY N/A 03/31/2019   Procedure: LEFT HEART CATH AND CORONARY ANGIOGRAPHY;  Surgeon: Lori Crome, MD;  Location: [facility];  Service: Cardiovascular;  Laterality: N/A;   LITHOTRIPSY     URETEROSCOPY      Current Medications: No outpatient medications have been marked as taking for the 07/15/22 encounter (Appointment) with [name], MD.     Allergies:   Codeine and Penicillins   Social History   Socioeconomic History   Marital status: Widowed    Spouse name: Not on file   Number of children: Not on file   Years of education: Not on file   Highest education level: Not on file  Occupational History   Not on file  Tobacco Use   Smoking status: Former    Types: Cigarettes    Quit date: 06/07/1985    Years since quitting: 37.1   Smokeless tobacco: Never  Vaping Use   Vaping Use: Never used  Substance and Sexual Activity   Alcohol use: No   Drug use: No   Sexual activity: Not on file  Other Topics Concern   Not on file  Social History Narrative   Not on file   Social Determinants of  Health   Financial Resource Strain: Not on file  Food Insecurity: Not on file  Transportation Needs: Not on file  Physical Activity: Not on file  Stress: Not on file  Social Connections: Not on file     Family History: The patient's ***family history includes CAD in her brother and brother; Cancer in her father and sister; Diabetes in her mother; Hypertension in her mother; Stroke in her mother. ROS:   Please see the history of present illness.    All other systems reviewed and are negative.  EKGs/Labs/Other Studies Reviewed:    The following studies were reviewed today:  EKG:  EKG ordered today and personally reviewed.  The ekg ordered today demonstrates ***  Recent Labs: No results found for requested labs within last 365 days.  Recent Lipid Panel    Component Value  Date/Time   CHOL 103 09/21/2020 1443   TRIG 106 09/21/2020 1443   HDL 37 (L) 09/21/2020 1443   CHOLHDL 2.8 09/21/2020 1443   CHOLHDL 3.0 03/31/2019 0750   VLDL 13 03/31/2019 0750   LDLCALC 46 09/21/2020 1443    Physical Exam:    VS:  There were no vitals taken for this visit.    Wt Readings from Last 3 Encounters:  06/30/22 207 lb 12.8 oz (94.3 kg)  06/16/22 212 lb (96.2 kg)  09/21/20 203 lb 9.6 oz (92.4 kg)     GEN: *** Well nourished, well developed in no acute distress HEENT: Normal NECK: No JVD; No carotid bruits LYMPHATICS: No lymphadenopathy CARDIAC: ***RRR, no murmurs, rubs, gallops RESPIRATORY:  Clear to auscultation without rales, wheezing or rhonchi  ABDOMEN: Soft, non-tender, non-distended MUSCULOSKELETAL:  No edema; No deformity  SKIN: Warm and dry NEUROLOGIC:  Alert and oriented x 3 PSYCHIATRIC:  Normal affect    Signed, Lori More, MD  07/15/2022 1:06 PM    [facility]

## 2022-07-15 ENCOUNTER — Ambulatory Visit: Payer: Medicare Other | Admitting: Cardiology

## 2022-09-01 ENCOUNTER — Ambulatory Visit: Payer: Medicare Other | Admitting: Internal Medicine

## 2022-09-01 ENCOUNTER — Encounter: Payer: Self-pay | Admitting: Internal Medicine

## 2022-09-01 VITALS — BP 124/70 | HR 73 | Temp 97.2°F | Resp 18 | Ht 69.0 in | Wt 215.0 lb

## 2022-09-01 DIAGNOSIS — I4719 Other supraventricular tachycardia: Secondary | ICD-10-CM

## 2022-09-01 DIAGNOSIS — I255 Ischemic cardiomyopathy: Secondary | ICD-10-CM

## 2022-09-01 DIAGNOSIS — E782 Mixed hyperlipidemia: Secondary | ICD-10-CM | POA: Diagnosis not present

## 2022-09-01 DIAGNOSIS — N184 Chronic kidney disease, stage 4 (severe): Secondary | ICD-10-CM

## 2022-09-01 DIAGNOSIS — E1122 Type 2 diabetes mellitus with diabetic chronic kidney disease: Secondary | ICD-10-CM | POA: Diagnosis not present

## 2022-09-01 DIAGNOSIS — Z794 Long term (current) use of insulin: Secondary | ICD-10-CM

## 2022-09-01 MED ORDER — TRULICITY 3 MG/0.5ML ~~LOC~~ SOAJ: 3.0000 mg | SUBCUTANEOUS | 6 refills | Status: DC

## 2022-09-01 MED ORDER — FREESTYLE LIBRE 14 DAY SENSOR MISC: 1.0000 | 6 refills | Status: DC

## 2022-09-01 MED ORDER — FREESTYLE LIBRE 14 DAY READER DEVI: 1.0000 | 1 refills | Status: DC

## 2022-09-01 NOTE — Progress Notes (Signed)
Office Visit  Subjective   Patient ID: Lori Rivera   DOB: 1946-01-14   Age: 77 y.o.   MRN: [id number]   Chief Complaint Chief Complaint  Patient presents with   Follow-up     History of Present Illness The patient is a 77 year old Caucasian/White female who presents for follow up. Sper daughter her sugar is up and down but most Iof the time it is up. Daughter is asking if we can give CGM as her fingers are getting sore. She is on [place] 96QIWLN daily, Trulicity 1.5 mg once a week injection and Jardiance 25 mg daily.  Her last hbA1c was 10 in December 29. She denies any further significant symptoms. She denies foot ulcers and recurrent urinary tract infections.  She states she exercises occasionally.  The patient's past medical history is: CAD, Coronary artery disease involving native coronary artery of native heart with other form of angina pectoris, Diabetes Mellitus, Type II, Heart Disease, Hyperlipidemia, Hypertension, Myocardial Infarction, and Vitamin B12 Deficiency. There is no history of foot ulcers and pancreatitis.  She is going to make an appointment to see eye doctor.  She does not do regular foot exam.  Lori Rivera returns today for routine followup on her cholesterol. Overall, she states she is doing well and is without any complaints or problems at this time. She specifically denies chest pain, abdominal pain, nausea, diarrhea, and myalgias. She remains on dietary management as well as the following cholesterol lowering medications atorvastatin 40 mg tablet. She is fasting in anticipation for labs today.      She has CKD 4 with GFR 28. She is on Lasix twice a day. She does not have microalbuminuria.   She has CAD with CHF combine both systolic and diastolic. She is on Lasix, jardiance and betablocker. She has an appointment with cardiology in few weeks.     Past Medical History Past Medical History:  Diagnosis Date   Aortic regurgitation 09/24/2017   Arthritis  09/24/2017   CAD in native artery 03/07/2015   Overview:  1. Out of hospital cardiac arrest Aug 2016 with MI, shock, PCI and 2 Xience DES to mid and distal culprit RCA with diffuse LAD disease and hypothermia protocol 2. NonSTEMI,05/27/15  Conclusions: 1. Pt was turned down by CTS due to co-morbidities. Pt is for PCI to multiple lesions on LAD and RCA. Ramus is small, will treat medically. 2. Successful PCI / Xience Drug Eluting Stent of the m   Chest pain 08/08/2015   CHF (congestive heart failure) ([place]) 09/24/2017   Chronic combined systolic and diastolic heart failure ([place]) 08/19/2015   Overview:  Echo 08/03/15: Left Ventricle Normal LV chamber size. Normal LV wall thickness. Mildly reduced overall LV systolic function. Estimated LVEF 45% Inferior, posterior and inferoseptal hypokinesis. Abnormal LV diastolic function, Grade 2, with echo evidence of elevated LA pressure.  Overview:  MUGA 20% 09/03/16 Overview:  Echo 08/03/15: Left Ventricle Normal LV chamber size. 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 Pt was turned down by CTS due to co-morbidities. Pt is for PCI to multiple lesions on LAD and RCA. Ramus is small, will treat medically. 2. Successful PCI / Xience Drug Eluting Stent of the mid and distal Left Anterior Descending  Coronary Artery. 3. Successful PCI / Xience Drug Eluting Stent of the ostial and mid Right Coronary Artery. 4. POBA to Di   PNA (pneumonia) 08/08/2015   Pneumonia due to infectious organism 08/19/2015   Overview:  Discharge summary 08/11/15: Her chest x-ray showed airspace opacities concerning for multifocal pneumonia and she will be discharged on oral Levaquin. Follow-up chest x-ray is recommended to ensure resolution.   Shortness of breath 09/24/2017   ST elevation myocardial infarction involving right coronary artery ([place]) 08/03/2015   Overview:  Due to ISR and acute stent thrombosis Cath 08/03/15: Diagnostic procedure: Left Heart Cath PCI procedure: Stent DES, Thrombectomy Complications: No Complications. Conclusions Diagnostic Summary Acute occlusion of RCA ostial stent thrombosis Severe stenosis of the mid RCA, partially ISR Patent LAD stent CTO of small circ system Interventional Summary Successful Thrombectomy/PTCA of the o   STEMI (ST elevation myocardial infarction) ([place]) 08/03/2015   Type 2 diabetes mellitus, without long-term current use of insulin ([place]) 03/07/2015   Vitamin B 12 deficiency 09/24/2017   Vitamin D deficiency 09/24/2017     Allergies Allergies  Allergen Reactions   Codeine Other (See Comments)    confused   Penicillins Rash    Did it involve swelling of the face/tongue/throat, SOB, or low BP? Yes Did it involve sudden or severe rash/hives, skin peeling, or any reaction on the inside of your mouth or nose? No Did you need to seek medical attention at a hospital or doctor's office? Yes When did it last happen?     20 yrs ago  If all above answers are "NO", may proceed with cephalosporin use.      Review of Systems Review of Systems  Constitutional: Negative.   Respiratory: Negative.    Cardiovascular: Negative.   Gastrointestinal: Negative.   Neurological: Negative.        Objective:    Vitals BP 124/70 (BP Location: Left Arm, Patient Position: Sitting, Cuff  Size: Normal)   Pulse 73   Temp (!) 97.2 F (36.2 C)   Resp 18   Ht '5\' 9"'$  (1.753 m)   Wt 215 lb (97.5 kg)   SpO2 99%   BMI 31.75 kg/m    Physical Examination Physical Exam Constitutional:      Appearance: Normal appearance. She is obese.  HENT:     Head: Normocephalic and atraumatic.  Eyes:     Extraocular Movements: Extraocular movements intact.     Pupils: Pupils are equal, round, and reactive to light.  Cardiovascular:     Rate and Rhythm: Normal rate and regular rhythm.     Heart sounds: Normal heart sounds.  Pulmonary:     Effort: Pulmonary effort is normal.     Breath sounds: Normal breath sounds.  Abdominal:     General: Bowel sounds are normal.     Palpations: Abdomen is soft.  Neurological:     General: No focal deficit present.     Mental Status: She is alert and oriented to person, place, and time.        Assessment & Plan:   PAT (paroxysmal atrial tachycardia) (HCC)  Her heart rate is controlled and she is not on any anticoagulations.   Ischemic cardiomyopathy She will continue to follow with cardiologist.    No follow-ups on file.   Garwin Brothers, MD

## 2022-09-01 NOTE — Assessment & Plan Note (Signed)
She will continue to follow with cardiologist.  

## 2022-09-01 NOTE — Assessment & Plan Note (Signed)
Her heart rate is controlled and she is not on any anticoagulations.

## 2022-09-05 ENCOUNTER — Other Ambulatory Visit: Payer: Self-pay

## 2022-09-05 MED ORDER — FREESTYLE LIBRE 14 DAY SENSOR MISC: 1.0000 | 6 refills | Status: DC

## 2022-09-08 ENCOUNTER — Ambulatory Visit: Payer: Medicare Other | Attending: Cardiology

## 2022-09-08 DIAGNOSIS — I255 Ischemic cardiomyopathy: Secondary | ICD-10-CM | POA: Diagnosis not present

## 2022-09-09 LAB — CUP PACEART REMOTE DEVICE CHECK
Battery Voltage: 44
Date Time Interrogation Session: 20240130080242
Implantable Lead Connection Status: 753985
Implantable Lead Connection Status: 753985
Implantable Lead Implant Date: 20180412
Implantable Lead Implant Date: 20180412
Implantable Lead Location: 753859
Implantable Lead Location: 753860
Implantable Lead Model: 377
Implantable Lead Model: 402266
Implantable Lead Serial Number: 4970639
Implantable Lead Serial Number: 49730543
Implantable Pulse Generator Implant Date: 20180412
Pulse Gen Model: 404623
Pulse Gen Serial Number: 60967865

## 2022-09-21 ENCOUNTER — Other Ambulatory Visit: Payer: Self-pay | Admitting: Internal Medicine

## 2022-09-25 ENCOUNTER — Other Ambulatory Visit: Payer: Self-pay | Admitting: Cardiology

## 2022-10-10 ENCOUNTER — Telehealth: Payer: Self-pay

## 2022-10-10 NOTE — Patient Outreach (Signed)
  Care Coordination   Initial Visit Note   10/10/2022 Name: AUNISTY EGGEBRECHT MRN: [id number] DOB: 1945-09-11  JASMANE LARANCE is a 77 y.o. year old female who sees Garwin Brothers, MD for primary care. I spoke with  [name] by phone today.  What matters to the patients health and wellness today?  Placed call to patient to review and offer [facility] case management.  Spoke with patient who reports she is doing well. Reports that she is struggling with her DM.  States her CBG is 200. Reports MD is aware and her insulin has been adjusted.  I offered assistance and patient refused.     SDOH assessments and interventions completed:  No     Care Coordination Interventions:  No, not indicated   Follow up plan: No further intervention required.   Encounter Outcome:  Pt. Visit Completed   Tomasa Rand, RN, BSN, CEN [place] Coordinator [phone]

## 2022-10-21 NOTE — Progress Notes (Signed)
Remote ICD transmission.   

## 2022-12-01 ENCOUNTER — Ambulatory Visit: Payer: Medicare Other | Admitting: Internal Medicine

## 2022-12-01 ENCOUNTER — Encounter: Payer: Self-pay | Admitting: Internal Medicine

## 2022-12-01 VITALS — BP 130/74 | HR 78 | Temp 97.2°F | Resp 18 | Ht 69.0 in | Wt 208.4 lb

## 2022-12-01 DIAGNOSIS — Z794 Long term (current) use of insulin: Secondary | ICD-10-CM

## 2022-12-01 DIAGNOSIS — E1122 Type 2 diabetes mellitus with diabetic chronic kidney disease: Secondary | ICD-10-CM | POA: Diagnosis not present

## 2022-12-01 DIAGNOSIS — L89322 Pressure ulcer of left buttock, stage 2: Secondary | ICD-10-CM

## 2022-12-01 DIAGNOSIS — N184 Chronic kidney disease, stage 4 (severe): Secondary | ICD-10-CM | POA: Diagnosis not present

## 2022-12-01 MED ORDER — TRULICITY 4.5 MG/0.5ML ~~LOC~~ SOAJ: 4.5000 mg | SUBCUTANEOUS | 6 refills | Status: DC

## 2022-12-01 MED ORDER — GLUCAGON 1 MG/0.2ML ~~LOC~~ SOAJ: 1.0000 mg | SUBCUTANEOUS | 2 refills | Status: DC | PRN

## 2022-12-01 NOTE — Assessment & Plan Note (Signed)
Daughter will do dressing and she will not put pressure on her back.

## 2022-12-01 NOTE — Progress Notes (Signed)
Office Visit  Subjective   Patient ID: CARMALITA WAKEFIELD   DOB: Sep 05, 1945   Age: 77 y.o.   MRN: [id number]   Chief Complaint Chief Complaint  Patient presents with   Follow-up    3 month follow up     History of Present Illness The patient is a 77 year old Caucasian/White female who presents for follow up. Sper daughter her sugar is up and down but most Iof the time it is up. Daughter is asking if we can give CGM as her fingers are getting sore. She is on [place] 24units daily, Trulicity 1.5 mg once a week injection and Jardiance 25 mg daily.  Her last hbA1c was 10 in December 29. She denies any further significant symptoms. She denies foot ulcers and recurrent urinary tract infections.  She states she exercises occasionally.  The patient's past medical history is: CAD, Coronary artery disease involving native coronary artery of native heart with other form of angina pectoris, Diabetes Mellitus, Type II, Heart Disease, Hyperlipidemia, Hypertension, Myocardial Infarction, and Vitamin B12 Deficiency. There is no history of foot ulcers and pancreatitis.  She is going to make an appointment to see eye doctor.  She does not do regular foot exam.  Zowie L. Plaza returns today for routine followup on her cholesterol. Overall, she states she is doing well and is without any complaints or problems at this time. She specifically denies chest pain, abdominal pain, nausea, diarrhea, and myalgias. She remains on dietary management as well as the following cholesterol lowering medications atorvastatin 40 mg tablet. She is fasting in anticipation for labs today.       She has CKD 4 with GFR 28. She is on Lasix twice a day. She does not have microalbuminuria.    She has CAD with CHF combine both systolic and diastolic. She is on Lasix, jardiance and betablocker. She has an appointment with cardiology in few weeks.     Past Medical History Past Medical History:  Diagnosis Date   Aortic regurgitation  09/24/2017   Arthritis 09/24/2017   CAD in native artery 03/07/2015   Overview:  1. Out of hospital cardiac arrest Aug 2016 with MI, shock, PCI and 2 Xience DES to mid and distal culprit RCA with diffuse LAD disease and hypothermia protocol 2. NonSTEMI,05/27/15  Conclusions: 1. Pt was turned down by CTS due to co-morbidities. Pt is for PCI to multiple lesions on LAD and RCA. Ramus is small, will treat medically. 2. Successful PCI / Xience Drug Eluting Stent of the m   Chest pain 08/08/2015   CHF (congestive heart failure) 09/24/2017   Chronic combined systolic and diastolic heart failure 08/19/2015   Overview:  Echo 08/03/15: Left Ventricle Normal LV chamber size. Normal LV wall thickness. Mildly reduced overall LV systolic function. Estimated LVEF 45% Inferior, posterior and inferoseptal hypokinesis. Abnormal LV diastolic function, Grade 2, with echo evidence of elevated LA pressure.  Overview:  MUGA 20% 09/03/16 Overview:  Echo 08/03/15: Left Ventricle Normal LV chamber size. 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 Pt was turned down by CTS due to co-morbidities. Pt is for PCI to multiple lesions on LAD and RCA. Ramus is small, will treat medically. 2. Successful PCI / Xience Drug Eluting Stent of the mid and distal Left Anterior Descending Coronary Artery. 3.  Successful PCI / Xience Drug Eluting Stent of the ostial and mid Right Coronary Artery. 4. POBA to Di   PNA (pneumonia) 08/08/2015   Pneumonia due to infectious organism 08/19/2015   Overview:  Discharge summary 08/11/15: Her chest x-ray showed airspace opacities concerning for multifocal pneumonia and she will be discharged on oral Levaquin. Follow-up chest x-ray is recommended to ensure resolution.   Shortness of breath 09/24/2017   ST elevation myocardial infarction involving right coronary artery 08/03/2015   Overview:  Due to ISR and acute stent thrombosis Cath 08/03/15: Diagnostic procedure: Left Heart Cath PCI procedure: Stent DES, Thrombectomy Complications: No Complications. Conclusions Diagnostic Summary Acute occlusion of RCA ostial stent thrombosis Severe stenosis of the mid RCA, partially ISR Patent LAD stent CTO of small circ system Interventional Summary Successful Thrombectomy/PTCA of the o   STEMI (ST elevation myocardial infarction) 08/03/2015   Type 2 diabetes mellitus, without long-term current use of insulin 03/07/2015   Vitamin B 12 deficiency 09/24/2017   Vitamin D deficiency 09/24/2017     Allergies Allergies  Allergen Reactions   Codeine Other (See Comments)    confused   Penicillins Rash    Did it involve swelling of the face/tongue/throat, SOB, or low BP? Yes Did it involve sudden or severe rash/hives, skin peeling, or any reaction on the inside of your mouth or nose? No Did you need to seek medical attention at a hospital or doctor's office? Yes When did it last happen?     20 yrs ago  If all above answers are "NO", may proceed with cephalosporin use.      Review of Systems ROS     Objective:    Vitals BP 130/74 (BP Location: Left Arm, Patient Position: Sitting, Cuff Size: Normal)   Pulse 78   Temp (!) 97.2 F (36.2 C)   Resp 18   Ht  (1.753 m)   Wt 208 lb 6 oz (94.5 kg)   SpO2 95%   BMI 30.77 kg/m    Physical Examination Physical Exam      Assessment & Plan:   No problem-specific Assessment & Plan notes found for this encounter.    No follow-ups on file.   Eloisa Northern, MD

## 2022-12-02 ENCOUNTER — Other Ambulatory Visit: Payer: Self-pay | Admitting: Internal Medicine

## 2022-12-02 ENCOUNTER — Other Ambulatory Visit: Payer: Self-pay | Admitting: Cardiology

## 2022-12-02 NOTE — Telephone Encounter (Signed)
Rx to pharmacy

## 2022-12-08 ENCOUNTER — Ambulatory Visit (INDEPENDENT_AMBULATORY_CARE_PROVIDER_SITE_OTHER): Payer: Medicare Other

## 2022-12-08 DIAGNOSIS — I255 Ischemic cardiomyopathy: Secondary | ICD-10-CM

## 2022-12-08 LAB — CUP PACEART REMOTE DEVICE CHECK
Battery Remaining Percentage: 40 %
Battery Voltage: 3.07 V
Brady Statistic RA Percent Paced: 78 %
Brady Statistic RV Percent Paced: 4 %
Date Time Interrogation Session: 20240429113221
HighPow Impedance: 82 Ohm
Implantable Lead Connection Status: 753985
Implantable Lead Connection Status: 753985
Implantable Lead Implant Date: 20180412
Implantable Lead Implant Date: 20180412
Implantable Lead Location: 753859
Implantable Lead Location: 753860
Implantable Lead Model: 377
Implantable Lead Model: 402266
Implantable Lead Serial Number: 4970639
Implantable Lead Serial Number: 49730543
Implantable Pulse Generator Implant Date: 20180412
Lead Channel Impedance Value: 537 Ohm
Lead Channel Impedance Value: 653 Ohm
Lead Channel Pacing Threshold Amplitude: 0.7 V
Lead Channel Pacing Threshold Amplitude: 0.7 V
Lead Channel Pacing Threshold Pulse Width: 0.4 ms
Lead Channel Pacing Threshold Pulse Width: 0.75 ms
Lead Channel Sensing Intrinsic Amplitude: 1.9 mV
Lead Channel Sensing Intrinsic Amplitude: 16.1 mV
Lead Channel Setting Pacing Amplitude: 1.7 V
Lead Channel Setting Pacing Amplitude: 2.5 V
Lead Channel Setting Pacing Pulse Width: 0.4 ms
Lead Channel Setting Sensing Sensitivity: 0.8 mV
Pulse Gen Model: 404623
Pulse Gen Serial Number: 60967865
Zone Setting Status: 755011

## 2022-12-29 ENCOUNTER — Ambulatory Visit: Payer: No Typology Code available for payment source | Admitting: Internal Medicine

## 2023-01-08 NOTE — Progress Notes (Signed)
Remote ICD transmission.   

## 2023-02-16 ENCOUNTER — Other Ambulatory Visit: Payer: Self-pay | Admitting: Internal Medicine

## 2023-02-25 ENCOUNTER — Ambulatory Visit (INDEPENDENT_AMBULATORY_CARE_PROVIDER_SITE_OTHER): Payer: Medicare Other | Admitting: Podiatry

## 2023-02-25 DIAGNOSIS — M79674 Pain in right toe(s): Secondary | ICD-10-CM

## 2023-02-25 DIAGNOSIS — M79675 Pain in left toe(s): Secondary | ICD-10-CM

## 2023-02-25 DIAGNOSIS — B351 Tinea unguium: Secondary | ICD-10-CM

## 2023-02-25 NOTE — Progress Notes (Signed)
    Subjective:  Patient ID: Lori Rivera, female    DOB: 12/17/1945,  MRN: [id number]  Lori Rivera presents to clinic today for:  Chief Complaint  Patient presents with   Nail Problem    [facility]   This diabetic patient notes nails are thick, discolored, elongated and painful in shoegear when trying to ambulate.    PCP is Eloisa Northern, MD.  Allergies  Allergen Reactions   Codeine Other (See Comments)    confused   Penicillins Rash    Did it involve swelling of the face/tongue/throat, SOB, or low BP? Yes Did it involve sudden or severe rash/hives, skin peeling, or any reaction on the inside of your mouth or nose? No Did you need to seek medical attention at a hospital or doctor's office? Yes When did it last happen?     20 yrs ago  If all above answers are "NO", may proceed with cephalosporin use.    Review of Systems: Negative except as noted in the HPI.  Objective:  There were no vitals filed for this visit.  Lori Rivera is a pleasant 77 y.o. female in NAD. AAO x 3.  Vascular Examination: Capillary refill time is 3-5 seconds to toes bilateral. Palpable pedal pulses b/l LE. Digital hair present b/l. No pedal edema b/l. Skin temperature gradient WNL b/l. No varicosities b/l. No cyanosis or clubbing noted b/l.   Dermatological Examination: Pedal skin with normal turgor, texture and tone b/l. No open wounds. No interdigital macerations b/l. Toenails x10 are 3mm thick, discolored, dystrophic with subungual debris. There is pain with compression of the nail plates.  They are elongated x10  Neurological Examination: Protective sensation intact bilateral LE. Vibratory sensation intact bilateral LE.  Assessment/Plan: 1. Pain due to onychomycosis of toenails of both feet     The mycotic toenails were sharply debrided x10 with sterile nail nippers and a power debriding burr to decrease bulk/thickness and length.    Return in about 3 months (around 05/28/2023) for  [facility].   Clerance Lav, DPM, FACFAS [facility]     [address] [place], [place] [address]                Office [phone]  Fax [phone]

## 2023-03-09 ENCOUNTER — Ambulatory Visit: Payer: Medicare Other

## 2023-03-09 DIAGNOSIS — I255 Ischemic cardiomyopathy: Secondary | ICD-10-CM | POA: Diagnosis not present

## 2023-03-24 NOTE — Progress Notes (Signed)
Remote ICD transmission.   

## 2023-03-26 ENCOUNTER — Other Ambulatory Visit: Payer: Self-pay | Admitting: Internal Medicine

## 2023-04-04 ENCOUNTER — Other Ambulatory Visit: Payer: Self-pay | Admitting: Cardiology

## 2023-04-04 ENCOUNTER — Other Ambulatory Visit: Payer: Self-pay | Admitting: Internal Medicine

## 2023-05-16 ENCOUNTER — Other Ambulatory Visit: Payer: Self-pay | Admitting: Internal Medicine

## 2023-05-25 DIAGNOSIS — G9341 Metabolic encephalopathy: Secondary | ICD-10-CM

## 2023-06-01 ENCOUNTER — Inpatient Hospital Stay: Payer: No Typology Code available for payment source | Admitting: Internal Medicine

## 2023-06-08 ENCOUNTER — Ambulatory Visit (INDEPENDENT_AMBULATORY_CARE_PROVIDER_SITE_OTHER): Payer: Medicare Other

## 2023-06-08 DIAGNOSIS — I5022 Chronic systolic (congestive) heart failure: Secondary | ICD-10-CM

## 2023-06-08 DIAGNOSIS — I255 Ischemic cardiomyopathy: Secondary | ICD-10-CM

## 2023-06-09 ENCOUNTER — Encounter: Payer: Self-pay | Admitting: Internal Medicine

## 2023-06-10 LAB — CUP PACEART REMOTE DEVICE CHECK
Battery Voltage: 32
Date Time Interrogation Session: 20241028124017
Implantable Lead Connection Status: 753985
Implantable Lead Connection Status: 753985
Implantable Lead Implant Date: 20180412
Implantable Lead Implant Date: 20180412
Implantable Lead Location: 753859
Implantable Lead Location: 753860
Implantable Lead Model: 377
Implantable Lead Model: 402266
Implantable Lead Serial Number: 4970639
Implantable Lead Serial Number: 49730543
Implantable Pulse Generator Implant Date: 20180412
Pulse Gen Model: 404623
Pulse Gen Serial Number: 60967865

## 2023-06-11 ENCOUNTER — Other Ambulatory Visit: Payer: Self-pay | Admitting: Internal Medicine

## 2023-06-18 ENCOUNTER — Telehealth: Payer: Self-pay

## 2023-06-18 NOTE — Telephone Encounter (Signed)
No messages received for at least 21 days Last message received 21 days ago. The patient will be deactivated in 68 days.     New.    Patient has biotronik device

## 2023-06-18 NOTE — Telephone Encounter (Signed)
Pt is in the nursing home until the end of the month.

## 2023-06-25 NOTE — Progress Notes (Signed)
Remote ICD transmission.   

## 2023-07-17 ENCOUNTER — Encounter: Payer: Medicare Other | Admitting: Physician Assistant

## 2023-07-21 DIAGNOSIS — I342 Nonrheumatic mitral (valve) stenosis: Secondary | ICD-10-CM | POA: Diagnosis not present

## 2023-07-21 DIAGNOSIS — I361 Nonrheumatic tricuspid (valve) insufficiency: Secondary | ICD-10-CM | POA: Diagnosis not present

## 2023-07-21 DIAGNOSIS — I340 Nonrheumatic mitral (valve) insufficiency: Secondary | ICD-10-CM | POA: Diagnosis not present

## 2023-07-22 DIAGNOSIS — I2510 Atherosclerotic heart disease of native coronary artery without angina pectoris: Secondary | ICD-10-CM | POA: Diagnosis not present

## 2023-07-22 DIAGNOSIS — N184 Chronic kidney disease, stage 4 (severe): Secondary | ICD-10-CM | POA: Diagnosis not present

## 2023-07-22 DIAGNOSIS — I480 Paroxysmal atrial fibrillation: Secondary | ICD-10-CM | POA: Diagnosis not present

## 2023-07-22 DIAGNOSIS — I509 Heart failure, unspecified: Secondary | ICD-10-CM | POA: Diagnosis not present

## 2023-07-29 NOTE — Progress Notes (Deleted)
Cardiology Office Note Date:  07/29/2023  Patient ID:  Lori, Rivera [birth year], MRN [id number] PCP:  Eloisa Northern, MD  Cardiologist:  Dr. Dulce Sellar EP: Dr. Elberta Fortis    Chief Complaint: *** annual visit  History of Present Illness: Lori Rivera is a 77 y.o. female with history of CAD s/p NSTEMI (h/o multiple PCIs), HFrEF (35-40%), ischemic cardiomyopathy, VT, ICD, hypertensive heart disease/HTN, HLD, CKD*** IV, DM2.  She was admitted to [facility] 03/31/19-04/05/19 for NSTEMI. Cardiac catheterization 03/31/19 - she was recommended for medical therapy due to poor functional capacity.  [facility] 04/06/2019 with chest pain and dyspnea.  Diagnosis/pulmonary edema in the setting of unstable angina and required intubation and transfer to [facility]  She had noted EKG changes with lateral ST depression.    She was readmitted to [facility] 04/07/2019 and subsequently discharged 04/14/19 with home health PT/OT.   She underwent successful atherectomy and stenting of the proximal LAD and left main on 04/08/2019.  Noted still occluded RCA and disease in her diagonals.  Recommended for aspirin and Brilinta for at least 1 year-likely DAPT indefinitely.  She had a treated VF episode 01/05/2020, pt was unaware and without any perceived symptoms, no AAD started    Last saw EP, by Dr. Elberta Fortis 06/16/22, discussed GDMT limited by orthostatic symptoms, no VT/VF No changes made  ADMITTED to [facility] 05/28/23, c/o CP troponin up to 6000  took her to the Cath Lab and found to have multivessel disease, showing known complete occlusion in RCA, 30% left main in-stent restenosis, stable 80% lesion in the mid left circumflex, and patent status from left main to LAD. They found no clear target for PCI, and recommended clopidogrel and Eliquis/discontinuing ASA  Noted to have AFIB during her admission > started on amiodarone and OAC Also treated for gout flare, oral thrush TTE with LVEF 35-40% + WMA low gradient  moderate-severe aortic stenosis.  Mod MR Mod p.HTN Discharged 06/03/23   *** ned gen cards f/u *** mod-severe AS.... *** eliquis, dose, labs, bleeding *** symptoms *** syncope, VT/VF ? *** AF burden *** amio labs  Device information Biotronik dual chamber ICD, implanted 11/21/2016 + Appropriate tx  01/05/2020, VF May 2020, VT June 2021 VF  AFib noted Oct 2024, during hospitalization w/NSTEMI  AAD Hx Amiodarone started Oct 2024  Past Medical History:  Diagnosis Date   Aortic regurgitation 09/24/2017   Arthritis 09/24/2017   CAD in native artery 03/07/2015   Overview:  1. Out of hospital cardiac arrest Aug 2016 with MI, shock, PCI and 2 Xience DES to mid and distal culprit RCA with diffuse LAD disease and hypothermia protocol 2. NonSTEMI,05/27/15  Conclusions: 1. Pt was turned down by CTS due to co-morbidities. Pt is for PCI to multiple lesions on LAD and RCA. Ramus is small, will treat medically. 2. Successful PCI / Xience Drug Eluting Stent of the m   Chest pain 08/08/2015   CHF (congestive heart failure) (HCC) 09/24/2017   Chronic combined systolic and diastolic heart failure (HCC) 08/19/2015   Overview:  Echo 08/03/15: Left Ventricle Normal LV chamber size. Normal LV wall thickness. Mildly reduced overall LV systolic function. Estimated LVEF 45% Inferior, posterior and inferoseptal hypokinesis. Abnormal LV diastolic function, Grade 2, with echo evidence of elevated LA pressure.  Overview:  MUGA 20% 09/03/16 Overview:  Echo 08/03/15: Left Ventricle Normal LV chamber size. 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 Pt was turned down by CTS due to co-morbidities. Pt is for PCI to multiple lesions on LAD and RCA. Ramus is small, will treat medically. 2. Successful PCI / Xience Drug Eluting Stent of the mid and distal Left Anterior Descending Coronary Artery. 3. Successful PCI / Xience Drug Eluting Stent of the ostial and mid Right Coronary Artery. 4. POBA to Di   PNA (pneumonia) 08/08/2015   Pneumonia due to infectious organism 08/19/2015   Overview:  Discharge summary 08/11/15: Her chest x-ray showed airspace opacities concerning for multifocal pneumonia and she will be discharged on oral Levaquin. Follow-up chest x-ray is recommended to ensure resolution.   Shortness of breath 09/24/2017   ST elevation myocardial infarction involving right coronary artery (HCC) 08/03/2015   Overview:  Due to ISR and acute stent thrombosis Cath 08/03/15: Diagnostic procedure: Left Heart Cath PCI procedure: Stent DES, Thrombectomy Complications: No Complications. Conclusions Diagnostic Summary Acute occlusion of RCA ostial stent thrombosis Severe stenosis of the mid RCA, partially ISR Patent LAD stent CTO of small circ system Interventional Summary Successful Thrombectomy/PTCA of the o   STEMI (ST elevation myocardial infarction) (HCC) 08/03/2015   Type 2 diabetes mellitus, without long-term current use of insulin (HCC) 03/07/2015   Vitamin B 12 deficiency 09/24/2017   Vitamin D deficiency 09/24/2017    Past Surgical History:  Procedure Laterality Date   C-spine surgery     CATARACT EXTRACTION, BILATERAL     CHOLECYSTECTOMY     CORONARY ANGIOPLASTY WITH STENT PLACEMENT     CORONARY ATHERECTOMY N/A 04/08/2019   Procedure: CORONARY  ATHERECTOMY;  Surgeon: Tonny Bollman, MD;  Location: [facility] INVASIVE CV LAB;  Service: Cardiovascular;  Laterality: N/A;   CORONARY STENT INTERVENTION W/IMPELLA N/A 04/08/2019   Procedure: Coronary Stent Intervention w/Impella;  Surgeon: Tonny Bollman, MD;  Location: [facility] INVASIVE CV LAB;  Service: Cardiovascular;  Laterality: N/A;   INSERT / REPLACE / REMOVE PACEMAKER     ICD, Medtronic   LEFT HEART CATH AND CORONARY ANGIOGRAPHY N/A 03/31/2019   Procedure: LEFT HEART CATH AND CORONARY ANGIOGRAPHY;  Surgeon: Lyn Records, MD;  Location: [facility] INVASIVE CV LAB;  Service: Cardiovascular;  Laterality: N/A;   LITHOTRIPSY     URETEROSCOPY      Current Outpatient Medications  Medication Sig Dispense Refill   aspirin EC 81 MG tablet Take 81 mg by mouth daily.      atorvastatin (LIPITOR) 40 MG tablet TAKE 1 TABLET BY MOUTH EVERY DAY 90 tablet 3   BD PEN NEEDLE NANO 2ND GEN 32G X 4 MM MISC      celecoxib (CELEBREX) 100 MG capsule TAKE 1 CAPSULE BY MOUTH EVERY DAY 30 capsule 2   clopidogrel (PLAVIX) 75 MG tablet TAKE 1 TABLET BY MOUTH EVERY DAY 90 tablet 3   Continuous Blood Gluc Receiver (FREESTYLE LIBRE 14 DAY READER) DEVI 1 each by Does not apply route every 14 (fourteen) days. 1 each 1  Continuous Blood Gluc Sensor (FREESTYLE LIBRE 14 DAY SENSOR) MISC 1 each by Does not apply route every 14 (fourteen) days. DX:E11.69 2 each 6   Dulaglutide (TRULICITY) 4.5 MG/0.5ML SOPN Inject 4.5 mg as directed once a week. 4 mL 6   furosemide (LASIX) 40 MG tablet TAKE 1 TABLET BY MOUTH IN THE MORNING AND 2 PM. 270 tablet 1   Glucagon 1 MG/0.2ML SOAJ Inject 1 mg into the skin as needed. 0.2 mL 2   insulin degludec (TRESIBA FLEXTOUCH) 100 UNIT/ML FlexTouch Pen INJECT 16 UNITS BY SUBCUTANEOUS ROUTE DAILY IN THE MORNING 1 mL 4   isosorbide dinitrate (ISORDIL) 20 MG tablet Take by mouth.     isosorbide mononitrate (IMDUR) 60 MG 24 hr tablet Take 1 tablet (60 mg total) by mouth daily. 30 tablet 0   JARDIANCE 25 MG TABS  tablet Take 25 mg by mouth every morning.     metoprolol tartrate (LOPRESSOR) 100 MG tablet TAKE 1 TABLET BY MOUTH TWICE A DAY 180 tablet 2   nitroGLYCERIN (NITROSTAT) 0.4 MG SL tablet Place 0.4 mg under the tongue every 5 (five) minutes as needed.      No current facility-administered medications for this visit.    Allergies:   Codeine and Penicillins   Social History:  The patient  reports that she quit smoking about 38 years ago. She has never used smokeless tobacco. She reports that she does not drink alcohol and does not use drugs.   Family History:  The patient's family history includes CAD in her brother and brother; Cancer in her father and sister; Diabetes in her mother; Hypertension in her mother; Stroke in her mother.  ROS:  Please see the history of present illness.    All other systems are reviewed and otherwise negative.   PHYSICAL EXAM: VS:  There were no vitals taken for this visit. BMI: There is no height or weight on file to calculate BMI. Well nourished, well developed, in no acute distress  HEENT: normocephalic, atraumatic  Neck: no JVD, carotid bruits or masses Cardiac: ***  RRR; no significant murmurs, no rubs, or gallops Lungs: *** CTA b/l, no wheezing, rhonchi or rales  Abd: soft, nontender MS: no deformity, age appropriate atrophy Ext: *** she has a brawny type edema, no pitting edema (reported her baseline for quite a long time)  Skin: warm and dry, no rash Neuro:  No gross deficits appreciated Psych: euthymic mood, full affect  *** ICD site is stable, no tethering or discomfort   EKG:  Done today shows  ***  ICD interrogation done today and reviewed by myself: *** Battery and lead measurements are OK ***  05/29/23: R/LHC   Multivessel disease. (Known CTO in RCA, 30% ostial left main in-stent  restenosis, stable 80 lesion in ramus, as well as 70 to 80% lesion in mid  left circumflex, patent stents from left main to LAD)    No clear target for PCI.     Normal left and right filling pressure, with borderline cardiac index  from thermodilution.   05/28/23: TTE SUMMARY  The left ventricle is mildly dilated.  There is normal left ventricular wall thickness.  Left ventricular systolic function is mild to moderately reduced.  LV ejection fraction = 35-40%.  Abnormal  paradoxical  septal motion consistent with RV pacemaker.  There are regional wall motion abnormalities as specified below.  There is hypokinesis of the basal to mid anterolateral, lateral, and inferolateral wall.  The right ventricle is normal  in size and function.  Device lead in the right ventricle  The left atrium is mildly dilated.  Diffuse thickening of the aortic valve with restricted cusp opening.  There is low flow  <35 ml-m2  low gradient moderate-severe aortic stenosis.  There is moderate aortic regurgitation.  There is mild to moderate mitral regurgitation.  There is mild tricuspid regurgitation.  Moderate pulmonary hypertension.  The aortic root is not well visualized.  The inferior vena cava was not visualized during the exam.  There is no pericardial effusion.  Compared to prior, AS seems to have progressed.    04/01/2019: TTE  IMPRESSIONS  1. The left ventricle has moderately reduced systolic function, with an ejection fraction of 35-40%. The cavity size was normal. Left ventricular diastolic Doppler parameters are consistent with impaired relaxation.  2. The right ventricle has normal systolic function. The cavity was normal. There is no increase in right ventricular wall thickness.  3. The mitral valve is abnormal. Moderate thickening of the mitral valve leaflet. Moderate calcification of the mitral valve leaflet.  4. The tricuspid valve is grossly normal.  5. The aortic valve is abnormal. Moderate thickening of the aortic valve. Moderate calcification of the aortic valve. Aortic valve regurgitation is mild by color flow Doppler. Mild stenosis of the  aortic valve.  6. The aorta is normal unless otherwise noted.  7. The aortic root and ascending aorta are normal in size and structure.  8. The atrial septum is grossly normal.   Recent Labs: No results found for requested labs within last 365 days.  No results found for requested labs within last 365 days.   CrCl cannot be calculated (Patient's most recent lab result is older than the maximum 21 days allowed.).   Wt Readings from Last 3 Encounters:  12/01/22 208 lb 6 oz (94.5 kg)  09/01/22 215 lb (97.5 kg)  06/30/22 207 lb 12.8 oz (94.3 kg)     Other studies reviewed: Additional studies/records reviewed today include: summarized above  ASSESSMENT AND PLAN:  ICD       *** Intact function     *** no programming changes made   2. CAD     *** severe/multivessel, recent NSTEMI/cath without targets     *** BB, statin     C/w Dr. Markus Daft  3. ICM 4. Chronic CHF (systolic)     *** She does not appear volume OL currently  5. *** AFib CHA2DS2Vasc is 7, on Eliquis, *** appropriately dosed *** % burden Amiodarone ***              Disposition: F/u with Dr. Mathis Bud in a few weeks, Dr. Elberta Fortis in 64mo  Current medicines are reviewed at length with the patient today.  The patient did not have any concerns regarding medicines.  Norma Fredrickson, PA-C 07/29/2023 2:50 PM     [facility] [address] [place] [address] [phone] (office)  [phone] (fax)

## 2023-07-31 ENCOUNTER — Ambulatory Visit: Payer: Medicare Other | Attending: Physician Assistant | Admitting: Physician Assistant

## 2023-08-14 ENCOUNTER — Other Ambulatory Visit: Payer: Self-pay | Admitting: Internal Medicine

## 2023-09-07 ENCOUNTER — Encounter: Payer: Medicare Other | Admitting: Cardiology

## 2023-09-12 DEATH — deceased
